# Patient Record
Sex: Female | Born: 1955
Health system: Southern US, Community
[De-identification: ages and names within clinical notes are randomized; demographics above are authoritative.]

## PROBLEM LIST (undated history)

## (undated) DIAGNOSIS — H269 Unspecified cataract: Secondary | ICD-10-CM

## (undated) DIAGNOSIS — T7840XA Allergy, unspecified, initial encounter: Secondary | ICD-10-CM

## (undated) DIAGNOSIS — K219 Gastro-esophageal reflux disease without esophagitis: Secondary | ICD-10-CM

## (undated) DIAGNOSIS — F32A Depression, unspecified: Secondary | ICD-10-CM

## (undated) DIAGNOSIS — IMO0002 Reserved for concepts with insufficient information to code with codable children: Secondary | ICD-10-CM

## (undated) DIAGNOSIS — O24419 Gestational diabetes mellitus in pregnancy, unspecified control: Secondary | ICD-10-CM

## (undated) DIAGNOSIS — C50919 Malignant neoplasm of unspecified site of unspecified female breast: Secondary | ICD-10-CM

## (undated) DIAGNOSIS — Z8619 Personal history of other infectious and parasitic diseases: Secondary | ICD-10-CM

## (undated) DIAGNOSIS — M1990 Unspecified osteoarthritis, unspecified site: Secondary | ICD-10-CM

## (undated) DIAGNOSIS — J45909 Unspecified asthma, uncomplicated: Secondary | ICD-10-CM

## (undated) DIAGNOSIS — I00 Rheumatic fever without heart involvement: Secondary | ICD-10-CM

## (undated) HISTORY — DX: Gestational diabetes mellitus in pregnancy, unspecified control: O24.419

## (undated) HISTORY — PX: BREAST SURGERY: SHX581

## (undated) HISTORY — DX: Unspecified asthma, uncomplicated: J45.909

## (undated) HISTORY — DX: Unspecified osteoarthritis, unspecified site: M19.90

## (undated) HISTORY — DX: Unspecified cataract: H26.9

## (undated) HISTORY — DX: Rheumatic fever without heart involvement: I00

## (undated) HISTORY — DX: Personal history of other infectious and parasitic diseases: Z86.19

## (undated) HISTORY — DX: Depression, unspecified: F32.A

## (undated) HISTORY — DX: Malignant neoplasm of unspecified site of unspecified female breast: C50.919

## (undated) HISTORY — PX: COLONOSCOPY: SHX174

## (undated) HISTORY — PX: TONSILLECTOMY: SUR1361

## (undated) HISTORY — PX: EYE SURGERY: SHX253

## (undated) HISTORY — DX: Gastro-esophageal reflux disease without esophagitis: K21.9

## (undated) HISTORY — DX: Allergy, unspecified, initial encounter: T78.40XA

## (undated) HISTORY — DX: Reserved for concepts with insufficient information to code with codable children: IMO0002

## (undated) HISTORY — PX: RETINAL TEAR REPAIR CRYOTHERAPY: SHX5304

---
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
## 2009-06-30 ENCOUNTER — Ambulatory Visit: Payer: Self-pay | Admitting: Diagnostic Radiology

## 2009-06-30 ENCOUNTER — Ambulatory Visit (HOSPITAL_BASED_OUTPATIENT_CLINIC_OR_DEPARTMENT_OTHER): Admission: RE | Admit: 2009-06-30 | Discharge: 2009-06-30 | Payer: Self-pay | Admitting: Orthopedic Surgery

## 2009-07-08 ENCOUNTER — Encounter: Admission: RE | Admit: 2009-07-08 | Discharge: 2009-10-06 | Payer: Self-pay | Admitting: Orthopedic Surgery

## 2009-08-31 ENCOUNTER — Ambulatory Visit: Payer: Self-pay | Admitting: Family Medicine

## 2009-08-31 ENCOUNTER — Other Ambulatory Visit: Admission: RE | Admit: 2009-08-31 | Discharge: 2009-08-31 | Payer: Self-pay | Admitting: Family Medicine

## 2009-08-31 DIAGNOSIS — G43009 Migraine without aura, not intractable, without status migrainosus: Secondary | ICD-10-CM | POA: Insufficient documentation

## 2009-08-31 DIAGNOSIS — J309 Allergic rhinitis, unspecified: Secondary | ICD-10-CM | POA: Insufficient documentation

## 2009-08-31 DIAGNOSIS — Z780 Asymptomatic menopausal state: Secondary | ICD-10-CM | POA: Insufficient documentation

## 2009-08-31 DIAGNOSIS — N841 Polyp of cervix uteri: Secondary | ICD-10-CM | POA: Insufficient documentation

## 2009-08-31 DIAGNOSIS — M129 Arthropathy, unspecified: Secondary | ICD-10-CM | POA: Insufficient documentation

## 2009-08-31 LAB — HM PAP SMEAR

## 2009-09-01 LAB — CONVERTED CEMR LAB
AST: 25 units/L (ref 0–37)
Albumin: 4.3 g/dL (ref 3.5–5.2)
BUN: 11 mg/dL (ref 6–23)
Basophils Relative: 0.4 % (ref 0.0–3.0)
Chloride: 106 meq/L (ref 96–112)
Cholesterol: 219 mg/dL — ABNORMAL HIGH (ref 0–200)
Creatinine, Ser: 0.8 mg/dL (ref 0.4–1.2)
Direct LDL: 145.3 mg/dL
Eosinophils Relative: 1.2 % (ref 0.0–5.0)
Glucose, Bld: 89 mg/dL (ref 70–99)
HDL: 55.8 mg/dL (ref >39.00)
Monocytes Relative: 4.3 % (ref 3.0–12.0)
Neutro Abs: 3 10*3/uL (ref 1.4–7.7)
Neutrophils Relative %: 66.6 % (ref 43.0–77.0)
Platelets: 285 10*3/uL (ref 150.0–400.0)
Potassium: 4.2 meq/L (ref 3.5–5.1)
Sodium: 146 meq/L — ABNORMAL HIGH (ref 135–145)
TSH: 1.19 microintl units/mL (ref 0.35–5.50)
Total Protein: 7.3 g/dL (ref 6.0–8.3)
Triglycerides: 112 mg/dL (ref 0.0–149.0)
Vit D, 25-Hydroxy: 43 ng/mL (ref 30–89)

## 2009-09-06 ENCOUNTER — Encounter (INDEPENDENT_AMBULATORY_CARE_PROVIDER_SITE_OTHER): Payer: Self-pay | Admitting: *Deleted

## 2009-09-07 ENCOUNTER — Encounter: Payer: Self-pay | Admitting: Family Medicine

## 2009-09-07 ENCOUNTER — Encounter (INDEPENDENT_AMBULATORY_CARE_PROVIDER_SITE_OTHER): Payer: Self-pay | Admitting: *Deleted

## 2009-09-13 ENCOUNTER — Encounter: Payer: Self-pay | Admitting: Family Medicine

## 2009-09-15 ENCOUNTER — Encounter (INDEPENDENT_AMBULATORY_CARE_PROVIDER_SITE_OTHER): Payer: Self-pay | Admitting: *Deleted

## 2009-09-20 ENCOUNTER — Ambulatory Visit: Payer: Self-pay | Admitting: Gastroenterology

## 2009-10-07 ENCOUNTER — Encounter: Payer: Self-pay | Admitting: Family Medicine

## 2009-10-11 ENCOUNTER — Ambulatory Visit: Payer: Self-pay | Admitting: Gastroenterology

## 2009-10-12 ENCOUNTER — Encounter: Payer: Self-pay | Admitting: Gastroenterology

## 2010-03-15 ENCOUNTER — Ambulatory Visit: Payer: Self-pay | Admitting: Family Medicine

## 2010-04-07 ENCOUNTER — Encounter: Payer: Self-pay | Admitting: Family Medicine

## 2010-04-07 LAB — HM MAMMOGRAPHY: HM Mammogram: NORMAL

## 2010-04-08 ENCOUNTER — Encounter (INDEPENDENT_AMBULATORY_CARE_PROVIDER_SITE_OTHER): Payer: Self-pay | Admitting: *Deleted

## 2010-04-11 ENCOUNTER — Encounter: Payer: Self-pay | Admitting: Family Medicine

## 2010-06-21 NOTE — Letter (Signed)
Summary: [facility] Instructions  [facility]  [address] [place], [place] [address]   Phone: [phone]  Fax: [phone]       Katherine Hanna    Apr 05, 1956    MRN: [id number]        Procedure Day Dorna Bloom:  Duanne Limerick  10/11/09     Arrival Time:  8:30AM     Procedure Time:  9:30AM     Location of Procedure:                    _ X_  [facility] (4th Floor)                        PREPARATION FOR COLONOSCOPY WITH MOVIPREP   Starting 5 days prior to your procedure 10/06/09 do not eat nuts, seeds, popcorn, corn, beans, peas,  salads, or any raw vegetables.  Do not take any fiber supplements (e.g. Metamucil, Citrucel, and Benefiber).  THE DAY BEFORE YOUR PROCEDURE         DATE: 10/10/09  DAY: SUNDAY  1.  Drink clear liquids the entire day-NO SOLID FOOD  2.  Do not drink anything colored red or purple.  Avoid juices with pulp.  No orange juice.  3.  Drink at least 64 oz. (8 glasses) of fluid/clear liquids during the day to prevent dehydration and help the prep work efficiently.  CLEAR LIQUIDS INCLUDE: Water Jello Ice Popsicles Tea (sugar ok, no milk/cream) Powdered fruit flavored drinks Coffee (sugar ok, no milk/cream) Gatorade Juice: apple, white grape, white cranberry  Lemonade Clear bullion, consomm, broth Carbonated beverages (any kind) Strained chicken noodle soup Hard Candy                             4.  In the morning, mix first dose of MoviPrep solution:    Empty 1 Pouch A and 1 Pouch B into the disposable container    Add lukewarm drinking water to the top line of the container. Mix to dissolve    Refrigerate (mixed solution should be used within 24 hrs)  5.  Begin drinking the prep at 5:00 p.m. The MoviPrep container is divided by 4 marks.   Every 15 minutes drink the solution down to the next mark (approximately 8 oz) until the full liter is complete.   6.  Follow completed prep with 16 oz of clear liquid of your choice (Nothing  red or purple).  Continue to drink clear liquids until bedtime.  7.  Before going to bed, mix second dose of MoviPrep solution:    Empty 1 Pouch A and 1 Pouch B into the disposable container    Add lukewarm drinking water to the top line of the container. Mix to dissolve    Refrigerate  THE DAY OF YOUR PROCEDURE      DATE: 10/11/09  DAY: MONDAY  Beginning at  4:30AM (5 hours before procedure):         1. Every 15 minutes, drink the solution down to the next mark (approx 8 oz) until the full liter is complete.  2. Follow completed prep with 16 oz. of clear liquid of your choice.    3. You may drink clear liquids until 7:30AM (2 HOURS BEFORE PROCEDURE).   MEDICATION INSTRUCTIONS  Unless otherwise instructed, you should take regular prescription medications with a small sip of water   as early as possible the  morning of your procedure.         OTHER INSTRUCTIONS  You will need a responsible adult at least 57 years of age to accompany you and drive you home.   This person must remain in the waiting room during your procedure.  Wear loose fitting clothing that is easily removed.  Leave jewelry and other valuables at home.  However, you may wish to bring a book to read or  an iPod/MP3 player to listen to music as you wait for your procedure to start.  Remove all body piercing jewelry and leave at home.  Total time from sign-in until discharge is approximately 2-3 hours.  You should go home directly after your procedure and rest.  You can resume normal activities the  day after your procedure.  The day of your procedure you should not:   Drive   Make legal decisions   Operate machinery   Drink alcohol   Return to work  You will receive specific instructions about eating, activities and medications before you leave.    The above instructions have been reviewed and explained to me by   Wyona Almas RN  Sep 20, 2009 11:24 AM     I fully understand and can  verbalize these instructions _____________________________ Date _________

## 2010-06-21 NOTE — Assessment & Plan Note (Signed)
Summary: Hoy Register #1/CDJ  Nurse Visit  CC: Twin rix #1./kb   Allergies: 1)  ! Penicillin  Immunizations Administered:  TwinRix # 1:    Vaccine Type: TwinRix    Site: left deltoid    Mfr: GlaxoSmithKline    Dose: 0.5 ml    Route: IM    Given by: Lucious Groves CMA    Exp. Date: 04/22/2010    Lot #: [id number]    VIS given: 02/07/07 version given March 15, 2010.  Orders Added: 1)  TwinRix 1ml ( Hep A&B Adult dose) [90636] 2)  Admin 1st Vaccine [90471]

## 2010-06-21 NOTE — Letter (Signed)
Summary: Patient Notice- Polyp Results  [facility]  [address] [place], [place] [address]   Phone: [phone]  Fax: [phone]        Oct 12, 2009 MRN: [id number]    JULEA HUTTO [address] [place], [place]  [address]    Dear Ms. Porte,  I am pleased to inform you that the colon polyp(s) removed during your recent colonoscopy was (were) found to be benign (no cancer detected) upon pathologic examination.  I recommend you have a repeat colonoscopy examination in 10_ years to look for recurrent polyps, as having colon polyps increases your risk for having recurrent polyps or even colon cancer in the future.  Should you develop new or worsening symptoms of abdominal pain, bowel habit changes or bleeding from the rectum or bowels, please schedule an evaluation with either your primary care physician or with me.  Additional information/recommendations:  xx__ No further action with gastroenterology is needed at this time. Please      follow-up with your primary care physician for your other healthcare      needs.  __ Please call [phone] to schedule a return visit to review your      situation.  __ Please keep your follow-up visit as already scheduled.  __ Continue treatment plan as outlined the day of your exam.  Please call us if you are having persistent problems or have questions about your condition that have not been fully answered at this time.  Sincerely,  Mardella Layman MD [facility]  This letter has been electronically signed by your physician.  Appended Document: Patient Notice- Polyp Results letter mailed.

## 2010-06-21 NOTE — Letter (Signed)
Summary: Results Follow up Letter  [facility]  [address] [place], [place] [address]   Phone: [phone]  Fax: [phone]    09/06/2009 MRN: [id number]  Katherine Hanna [address] [place], [place]  [address]  Dear Ms. Rettinger,  The following are the results of your recent test(s):  Test         Result    Pap Smear:        Normal __X___  Not Normal _____ Comments: REPEAT 39YR. ______________________________________________________ Cholesterol: LDL(Bad cholesterol):         Your goal is less than:         HDL (Good cholesterol):       Your goal is more than: Comments:  ______________________________________________________ Mammogram:        Normal _____  Not Normal _____ Comments:  ___________________________________________________________________ Hemoccult:        Normal _____  Not normal _______ Comments:    _____________________________________________________________________ Other Tests:    We routinely do not discuss normal results over the telephone.  If you desire a copy of the results, or you have any questions about this information we can discuss them at your next office visit.   Sincerely,

## 2010-06-21 NOTE — Letter (Signed)
Summary: Letter Regarding Health Mgmt Program/[employer]  Letter Regarding Health Mgmt Program/[employer]   Imported By: Lanelle Bal 04/22/2010 08:53:58  _____________________________________________________________________  External Attachment:    Type:   Image     Comment:   External Document

## 2010-06-21 NOTE — Miscellaneous (Signed)
  Clinical Lists Changes  Observations: Added new observation of MAMMOGRAM: normal (04/07/2010 10:58)      Preventive Care Screening  Mammogram:    Date:  04/07/2010    Results:  normal

## 2010-06-21 NOTE — Consult Note (Signed)
Summary: [facility]   Imported By: Lanelle Bal 09/22/2009 09:36:28  _____________________________________________________________________  External Attachment:    Type:   Image     Comment:   External Document

## 2010-06-21 NOTE — Miscellaneous (Signed)
Summary: [facility] Previsit/prep  Clinical Lists Changes  Medications: Added new medication of MOVIPREP 100 GM  SOLR (PEG-KCL-NACL-NASULF-NA ASC-C) As per prep instructions. - Signed Rx of MOVIPREP 100 GM  SOLR (PEG-KCL-NACL-NASULF-NA ASC-C) As per prep instructions.;  #1 x 0;  Signed;  Entered by: Wyona Almas RN;  Authorized by: Mardella Layman MD [facility];  Method used: Electronically to [facility] [address] [place], [place], [place]  [address], Ph: [phone], Fax: [phone] Observations: Added new observation of ALLERGY REV: Done (09/20/2009 10:55)    Prescriptions: MOVIPREP 100 GM  SOLR (PEG-KCL-NACL-NASULF-NA ASC-C) As per prep instructions.  #1 x 0   Entered by:   Wyona Almas RN   Authorized by:   Mardella Layman MD [facility]   Signed by:   Wyona Almas RN on 09/20/2009   Method used:   Electronically to        [facility] (retail)       [address]       [place], [place]  [address]       Ph: [phone]       Fax: [phone]   RxID:   [id number]

## 2010-06-21 NOTE — Procedures (Signed)
Summary: Colonoscopy  Patient: Katherine Hanna Note: All result statuses are Final unless otherwise noted.  Tests: (1) Colonoscopy (COL)   COL Colonoscopy           DONE (C)     [facility]     [address]     [place], [place]  [address]           COLONOSCOPY PROCEDURE REPORT           PATIENT:  Katherine Hanna, Katherine Hanna  MR#:  [id number]     BIRTHDATE:  1955/09/24, 53 yrs. old  GENDER:  female     ENDOSCOPIST:  Vania Rea. Jarold Motto, MD, [facility]     REF. BY:  Helane Rima. Beverely Low, M.D.     PROCEDURE DATE:  10/11/2009     PROCEDURE:  Colonoscopy with biopsy     ASA CLASS:  Class I     INDICATIONS:  Routine Risk Screening     MEDICATIONS:   Fentanyl 50 mcg IV, Versed 3 mg IV           DESCRIPTION OF PROCEDURE:   After the risks benefits and     alternatives of the procedure were thoroughly explained, informed     consent was obtained.  Digital rectal exam was performed and     revealed no abnormalities.   The [id number] [id number] endoscope was     introduced through the anus and advanced to the cecum, which was     identified by both the appendix and ileocecal valve, without     limitations.  The quality of the prep was excellent, using     MoviPrep.  The instrument was then slowly withdrawn as the colon     was fully examined.     <<PROCEDUREIMAGES>>           FINDINGS:  Scattered diverticula were found in the sigmoid colon.     A nodule was found in the sigmoid colon. COLD BIOPSY OF 2 MM     NODULE.  No polyps or cancers were seen.  This was otherwise a     normal examination of the colon.   Retroflexed views in the rectum     revealed no abnormalities.    The scope was then withdrawn from     the patient and the procedure completed.           COMPLICATIONS:  None     ENDOSCOPIC IMPRESSION:     1) Diverticula, scattered in the sigmoid colon     2) Nodule in the sigmoid colon     3) No polyps or cancers     4) Otherwise normal examination     R/O ADENOMA.     RECOMMENDATIONS:     1) high  fiber diet     IF BX. SHOWS AN ADENOMA,REPEAT IN 5 YEARS.     REPEAT EXAM:  No           ______________________________     Vania Rea. Jarold Motto, MD, [facility]           CC:  Sheliah Hatch, MD           n.     REVISED:  10/20/2009 10:28 AM     eSIGNED:   Vania Rea. Alexsandro Salek at 10/20/2009 10:28 AM           Wynonia Hazard, [id number]  Note: An exclamation mark (!) indicates a result that was not dispersed into the flowsheet. Document Creation Date:  10/20/2009 10:29 AM _______________________________________________________________________  (1) Order result status: Final Collection or observation date-time: 10/11/2009 09:43 Requested date-time:  Receipt date-time:  Reported date-time:  Referring Physician:   Ordering Physician: Sheryn Bison [phone]) Specimen Source:  Source: Launa Grill Order Number: [id number] Lab site:

## 2010-06-21 NOTE — Miscellaneous (Signed)
  Clinical Lists Changes  Observations: Added new observation of MAMMOGRAM: incomplete-additional f/u (09/07/2009 16:47)      Preventive Care Screening  Mammogram:    Date:  09/07/2009    Results:  incomplete-additional f/u

## 2010-06-21 NOTE — Procedures (Signed)
Summary: Colonoscopy  Patient: Katherine Hanna Note: All result statuses are Final unless otherwise noted.  Tests: (1) Colonoscopy (COL)   COL Colonoscopy           DONE     [facility]     [address]     [place], [place]  [address]           COLONOSCOPY PROCEDURE REPORT           PATIENT:  Katherine, Hanna  MR#:  [id number]     BIRTHDATE:  [birth year], 53 yrs. old  GENDER:  female     ENDOSCOPIST:  Vania Rea. Jarold Motto, MD, [facility]     REF. BY:  Helane Rima. Beverely Low, M.D.     PROCEDURE DATE:  10/11/2009     PROCEDURE:  Colonoscopy with biopsy     ASA CLASS:  Class I     INDICATIONS:  Routine Risk Screening     MEDICATIONS:   Fentanyl 50 mcg IV, Versed 3 mg IV           DESCRIPTION OF PROCEDURE:   After the risks benefits and     alternatives of the procedure were thoroughly explained, informed     consent was obtained.  Digital rectal exam was performed and     revealed no abnormalities.   The [id number] [id number] endoscope was     introduced through the anus and advanced to the cecum, which was     identified by both the appendix and ileocecal valve, without     limitations.  The quality of the prep was excellent, using     MoviPrep.  The instrument was then slowly withdrawn as the colon     was fully examined.     <<PROCEDUREIMAGES>>           FINDINGS:  Scattered diverticula were found in the sigmoid colon.     A nodule was found in the sigmoid colon. COLD BIOPSY OF 2 MM     NODULE.  No polyps or cancers were seen.  This was otherwise a     normal examination of the colon.   Retroflexed views in the rectum     revealed no abnormalities.    The scope was then withdrawn from     the patient and the procedure completed.           COMPLICATIONS:  None     ENDOSCOPIC IMPRESSION:     1) Diverticula, scattered in the sigmoid colon     2) Nodule in the sigmoid colon     3) No polyps or cancers     4) Otherwise normal examination     R/O ADENOMA.     RECOMMENDATIONS:     1) high  fiber diet     2) Repeat colonoscopy in 5 years if polyp adenomatous; otherwise     10 years     REPEAT EXAM:  No           ______________________________     Vania Rea. Jarold Motto, MD, [facility]           CC:  Sheliah Hatch, MD           n.     Rosalie DoctorVania Rea. Patterson at 10/11/2009 09:47 AM           Wynonia Hazard, [id number]  Note: An exclamation mark (!) indicates a result that was not dispersed into the flowsheet. Document Creation Date: 10/11/2009 9:48  AM _______________________________________________________________________  (1) Order result status: Final Collection or observation date-time: 10/11/2009 09:43 Requested date-time:  Receipt date-time:  Reported date-time:  Referring Physician:   Ordering Physician: Sheryn Bison [phone]) Specimen Source:  Source: Launa Grill Order Number: [id number] Lab site:   Appended Document: Colonoscopy 10y f/u

## 2010-06-21 NOTE — Assessment & Plan Note (Signed)
Summary: NEW TO EST/CPX//PH   Vital Signs:  Patient profile:   55 year old female Height:      65 inches Weight:      164 pounds BMI:     27.39 Pulse rate:   86 / minute BP sitting:   112 / 80  (left arm)  Vitals Entered By: Doristine Devoid (August 31, 2009 10:21 AM) CC: NEW EST- CPX AND PAP W/ LABS    History of Present Illness: 55 yo woman here today to establish care.  Last doctor visit 15 yrs ago.  no concerns today.  Preventive Screening-Counseling & Management  Alcohol-Tobacco     Alcohol drinks/day: 0     Smoking Status: never  Caffeine-Diet-Exercise     Does Patient Exercise: yes      Sexual History:  currently monogamous.        Drug Use:  no.    Current Medications (verified): 1)  Multivitamins  Tabs (Multiple Vitamin) .... Take One Tablet Daily 2)  Calcium 600 Mg Tabs (Calcium) .... Take One Tablet Daily 3)  Claritin 10 Mg Tabs (Loratadine) .... Take One Tablet Daily  Allergies (verified): 1)  ! Penicillin  Past History:  Past Medical History: Arthritis-knee hx of Chicken Pox Gestational Diabetes Allergic rhinitis Migraines Rheumatic Fever as child   Past Surgical History: Appendectomy Tonsillectomy  Family History: CAD-maternal grandfather HTN-no DM-father STROKE-father,maternal grandmother 40's,paternal grandmother COLON CA-no BREAST CA-no LUNG CA-mother BLADDER CA-father ACUTE LEUKEMIA-father  Social History: Married Never Smoked Alcohol use-no Drug use-no Smoking Status:  never Drug Use:  no Does Patient Exercise:  yes Sexual History:  currently monogamous  Review of Systems  The patient denies anorexia, fever, weight loss, weight gain, vision loss, decreased hearing, hoarseness, chest pain, syncope, dyspnea on exertion, peripheral edema, prolonged cough, headaches, abdominal pain, melena, hematochezia, severe indigestion/heartburn, hematuria, suspicious skin lesions, depression, abnormal bleeding, enlarged lymph nodes, and  breast masses.    Physical Exam  General:  Well-developed,well-nourished,in no acute distress; alert,appropriate and cooperative throughout examination Head:  Normocephalic and atraumatic without obvious abnormalities. No apparent alopecia or balding. Eyes:  No corneal or conjunctival inflammation noted. EOMI. Perrla. Funduscopic exam benign, without hemorrhages, exudates or papilledema. Vision grossly normal. Ears:  External ear exam shows no significant lesions or deformities.  Otoscopic examination reveals clear canals, tympanic membranes are intact bilaterally without bulging, retraction, inflammation or discharge. Hearing is grossly normal bilaterally. Nose:  External nasal examination shows no deformity or inflammation. Nasal mucosa are pink and moist without lesions or exudates. Mouth:  Oral mucosa and oropharynx without lesions or exudates.  Teeth in good repair. Neck:  No deformities, masses, or tenderness noted. Breasts:  No mass, nodules, thickening, tenderness, bulging, retraction, inflamation, nipple discharge or skin changes noted.   Lungs:  Normal respiratory effort, chest expands symmetrically. Lungs are clear to auscultation, no crackles or wheezes. Heart:  Normal rate and regular rhythm. S1 and S2 normal without gallop, murmur, click, rub or other extra sounds. Abdomen:  Bowel sounds positive,abdomen soft and non-tender without masses, organomegaly or hernias noted. Genitalia:  Pelvic Exam:        External: normal female genitalia without lesions or masses        Vagina: normal without lesions or masses        Cervix: polyp protruding from os        Adnexa: normal bimanual exam without masses or fullness        Uterus: normal by palpation  Pap smear: performed Msk:  No deformity or scoliosis noted of thoracic or lumbar spine.   Pulses:  R and L carotid,radial,femoral,dorsalis pedis and posterior tibial pulses are full and equal bilaterally Extremities:  No clubbing,  cyanosis, edema, or deformity noted with normal full range of motion of all joints.   Neurologic:  No cranial nerve deficits noted. Station and gait are normal. Plantar reflexes are down-going bilaterally. DTRs are symmetrical throughout. Sensory, motor and coordinative functions appear intact. Skin:  Intact without suspicious lesions or rashes Cervical Nodes:  No lymphadenopathy noted Axillary Nodes:  No palpable lymphadenopathy Psych:  Cognition and judgment appear intact. Alert and cooperative with normal attention span and concentration. No apparent delusions, illusions, hallucinations   Impression & Recommendations:  Problem # 1:  ROUTINE GYNECOLOGICAL EXAMINATION (ICD-V72.31) Assessment New pt's PE WNL w/ exception of cervical polyp (see below).  check labs.  refer for routing mammogram and colonoscopy screenings.  anticipatory guidance provided. Orders: Venipuncture ([address]) TLB-Lipid Panel (80061-LIPID) TLB-BMP (Basic Metabolic Panel-BMET) (80048-METABOL) TLB-CBC Platelet - w/Differential (85025-CBCD) TLB-Hepatic/Liver Function Pnl (80076-HEPATIC) TLB-TSH (Thyroid Stimulating Hormone) (84443-TSH) T-Vitamin D (25-Hydroxy) ([address]) EKG w/ Interpretation ([address]) Radiology Referral (Radiology)  Problem # 2:  SCREENING FOR MALIGNANT NEOPLASM OF THE CERVIX (ICD-V76.2) Assessment: New pap collected  Problem # 3:  CERVICAL POLYP (ICD-622.7) Assessment: New likely needs bx.  refer to gyn. Orders: Gynecologic Referral (Gyn)  Complete Medication List: 1)  Multivitamins Tabs (Multiple vitamin) .... Take one tablet daily 2)  Calcium 600 Mg Tabs (Calcium) .... Take one tablet daily 3)  Claritin 10 Mg Tabs (Loratadine) .... Take one tablet daily  Other Orders: Gastroenterology Referral (GI)  Patient Instructions: 1)  Follow up in 1 year or as needed 2)  We'll notify you of your lab results 3)  Someone will call you with your mammogram, GI, and GYN appts 4)  Please call with  any questions or concerns 5)  Welcome!  We're glad to have you!   Immunization History:  Tetanus/Td Immunization History:    Tetanus/Td:  historical (09/02/2006)

## 2010-07-25 ENCOUNTER — Encounter: Payer: Self-pay | Admitting: Family Medicine

## 2010-07-25 ENCOUNTER — Ambulatory Visit (INDEPENDENT_AMBULATORY_CARE_PROVIDER_SITE_OTHER): Payer: BC Managed Care – PPO

## 2010-07-25 DIAGNOSIS — Z23 Encounter for immunization: Secondary | ICD-10-CM

## 2010-08-02 NOTE — Assessment & Plan Note (Signed)
Summary: 2nd TwinRix//fd  Nurse Visit   Allergies: 1)  ! Penicillin  Immunizations Administered:  TwinRix # 2:    Vaccine Type: TwinRix    Site: left deltoid    Mfr: GlaxoSmithKline    Dose: 1.0 ml    Route: IM    Given by: Jeremy Johann CMA    Exp. Date: 02/26/2012    Lot #: [id number]    VIS given: 02/07/07 version given July 25, 2010.  Orders Added: 1)  TwinRix 1ml ( Hep A&B Adult dose) [90636] 2)  Admin 1st Vaccine [90471]

## 2010-09-27 ENCOUNTER — Encounter: Payer: Self-pay | Admitting: Family Medicine

## 2010-10-05 ENCOUNTER — Encounter: Payer: Self-pay | Admitting: Family Medicine

## 2010-10-05 ENCOUNTER — Other Ambulatory Visit (HOSPITAL_COMMUNITY)
Admission: RE | Admit: 2010-10-05 | Discharge: 2010-10-05 | Disposition: A | Discharge status: Home / Self Care | Payer: BC Managed Care – PPO | Source: Ambulatory Visit | Attending: Family Medicine | Admitting: Family Medicine

## 2010-10-05 ENCOUNTER — Ambulatory Visit (INDEPENDENT_AMBULATORY_CARE_PROVIDER_SITE_OTHER): Payer: BC Managed Care – PPO | Admitting: Family Medicine

## 2010-10-05 VITALS — BP 140/80 | Temp 98.2°F | Ht 65.5 in | Wt 161.5 lb

## 2010-10-05 DIAGNOSIS — Z01419 Encounter for gynecological examination (general) (routine) without abnormal findings: Secondary | ICD-10-CM

## 2010-10-05 DIAGNOSIS — Z136 Encounter for screening for cardiovascular disorders: Secondary | ICD-10-CM

## 2010-10-05 DIAGNOSIS — Z124 Encounter for screening for malignant neoplasm of cervix: Secondary | ICD-10-CM

## 2010-10-05 DIAGNOSIS — F329 Major depressive disorder, single episode, unspecified: Secondary | ICD-10-CM

## 2010-10-05 DIAGNOSIS — Z0000 Encounter for general adult medical examination without abnormal findings: Secondary | ICD-10-CM

## 2010-10-05 LAB — HEPATIC FUNCTION PANEL
ALT: 24 U/L (ref 0–35)
Albumin: 4.1 g/dL (ref 3.5–5.2)
Total Bilirubin: 0.5 mg/dL (ref 0.3–1.2)
Total Protein: 7.1 g/dL (ref 6.0–8.3)

## 2010-10-05 LAB — CBC WITH DIFFERENTIAL/PLATELET
Basophils Absolute: 0 10*3/uL (ref 0.0–0.1)
Basophils Relative: 0.3 % (ref 0.0–3.0)
Eosinophils Absolute: 0.1 10*3/uL (ref 0.0–0.7)
MCHC: 34.5 g/dL (ref 30.0–36.0)
MCV: 91.1 fl (ref 78.0–100.0)
Monocytes Absolute: 0.3 10*3/uL (ref 0.1–1.0)
Neutrophils Relative %: 66 % (ref 43.0–77.0)
Platelets: 282 10*3/uL (ref 150.0–400.0)
RDW: 13.1 % (ref 11.5–14.6)

## 2010-10-05 LAB — BASIC METABOLIC PANEL
CO2: 29 mEq/L (ref 19–32)
Creatinine, Ser: 0.8 mg/dL (ref 0.4–1.2)
Potassium: 4.6 mEq/L (ref 3.5–5.1)
Sodium: 143 mEq/L (ref 135–145)

## 2010-10-05 LAB — LIPID PANEL: HDL: 57 mg/dL (ref >39.00)

## 2010-10-05 LAB — LDL CHOLESTEROL, DIRECT: Direct LDL: 162.7 mg/dL

## 2010-10-05 MED ORDER — CITALOPRAM HYDROBROMIDE 20 MG PO TABS: 20.0000 mg | ORAL_TABLET | Freq: Every day | ORAL | Status: DC

## 2010-10-05 NOTE — Patient Instructions (Signed)
Follow up in 1 month to recheck mood Start the Citalopram daily for the mood We'll notify you of your lab results Call with any questions or concerns Happy Memorial Day!

## 2010-10-05 NOTE — Progress Notes (Signed)
  Subjective:    Patient ID: Katherine Hanna, female    DOB: 11-01-1955, 55 y.o.   MRN: [id number]  HPI CPE- UTD on colonscopy, mammo.  'just blue'- both children married last year and moved.  Reports she is able to fxn but at times has 'an overwhelming sadness'.  Not hx of similar- reports previous depressive episodes have been situational and have been shorter than 6 months.  Has started to increase exercise to see if sxs improve.   Review of Systems Patient reports no vision/ hearing changes, adenopathy,fever, weight change,  persistant/recurrent hoarseness , swallowing issues, chest pain, palpitations, edema, persistant/recurrent cough, hemoptysis, dyspnea (rest/exertional/paroxysmal nocturnal), gastrointestinal bleeding (melena, rectal bleeding), abdominal pain, significant heartburn, bowel changes, GU symptoms (dysuria, hematuria, incontinence), Gyn symptoms (abnormal  bleeding, pain),  syncope, focal weakness, memory loss, numbness & tingling, abnormal bruising or bleeding.  + ringworm on R lower leg- treating appropriately w/ OTC antifungal cream     Objective:   Physical Exam  General Appearance:    Alert, cooperative, no distress, appears stated age  Head:    Normocephalic, without obvious abnormality, atraumatic  Eyes:    PERRL, conjunctiva/corneas clear, EOM's intact, fundi    benign, both eyes  Ears:    Normal TM's and external ear canals, both ears  Nose:   Nares normal, septum midline, mucosa normal, no drainage    or sinus tenderness  Throat:   Lips, mucosa, and tongue normal; teeth and gums normal  Neck:   Supple, symmetrical, trachea midline, no adenopathy;    Thyroid: no enlargement/tenderness/nodules  Back:     Symmetric, no curvature, ROM normal, no CVA tenderness  Lungs:     Clear to auscultation bilaterally, respirations unlabored  Chest Wall:    No tenderness or deformity   Heart:    Regular rate and rhythm, S1 and S2 normal, no murmur, rub   or gallop  Breast  Exam:    No tenderness, masses, or nipple abnormality  Abdomen:     Soft, non-tender, bowel sounds active all four quadrants,    no masses, no organomegaly  Genitalia:    External genitalia normal, cervix normal in appearance, no CMT, uterus in normal size and position, adnexa w/out mass or tenderness, mucosa pink and moist, no lesions or discharge present  Rectal:    Normal external appearance  Extremities:   Extremities normal, atraumatic, no cyanosis or edema  Pulses:   2+ and symmetric all extremities  Skin:   Skin color, texture, turgor normal, + ringworm on R lower leg  Lymph nodes:   Cervical, supraclavicular, and axillary nodes normal  Neurologic:   CNII-XII intact, normal strength, sensation and reflexes    throughout          Assessment & Plan:

## 2010-10-07 LAB — VITAMIN D 1,25 DIHYDROXY: Vitamin D2 1, 25 (OH)2: <8 pg/mL

## 2010-10-10 ENCOUNTER — Encounter: Payer: Self-pay | Admitting: *Deleted

## 2010-10-11 DIAGNOSIS — F32A Depression, unspecified: Secondary | ICD-10-CM | POA: Insufficient documentation

## 2010-10-11 DIAGNOSIS — F329 Major depressive disorder, single episode, unspecified: Secondary | ICD-10-CM | POA: Insufficient documentation

## 2010-10-11 NOTE — Assessment & Plan Note (Signed)
Pt's mood may be adjustment reaction but given duration >6 months will start low dose SSRI and see if sxs improve.  Will follow closely.

## 2010-10-11 NOTE — Assessment & Plan Note (Signed)
Pap collected. 

## 2010-10-11 NOTE — Assessment & Plan Note (Signed)
Pt's PE WNL w/ exception of ringworm.  UTD on health maintenance.  EKG done as baseline.  Anticipatory guidance provided.

## 2010-10-24 ENCOUNTER — Encounter: Payer: Self-pay | Admitting: Family Medicine

## 2011-10-25 ENCOUNTER — Encounter: Payer: Self-pay | Admitting: Family Medicine

## 2011-12-26 ENCOUNTER — Ambulatory Visit (INDEPENDENT_AMBULATORY_CARE_PROVIDER_SITE_OTHER): Payer: BC Managed Care – PPO | Admitting: Family Medicine

## 2011-12-26 ENCOUNTER — Telehealth: Payer: Self-pay | Admitting: Family Medicine

## 2011-12-26 ENCOUNTER — Encounter: Payer: Self-pay | Admitting: Family Medicine

## 2011-12-26 VITALS — BP 112/74 | HR 89 | Temp 98.2°F | Wt 177.2 lb

## 2011-12-26 DIAGNOSIS — J45901 Unspecified asthma with (acute) exacerbation: Secondary | ICD-10-CM

## 2011-12-26 DIAGNOSIS — R0789 Other chest pain: Secondary | ICD-10-CM

## 2011-12-26 MED ORDER — ALBUTEROL SULFATE (5 MG/ML) 0.5% IN NEBU
2.5000 mg | INHALATION_SOLUTION | Freq: Once | RESPIRATORY_TRACT | Status: AC
  Administered 2011-12-26: 2.5 mg via RESPIRATORY_TRACT

## 2011-12-26 MED ORDER — METHYLPREDNISOLONE ACETATE 80 MG/ML IJ SUSP
80.0000 mg | Freq: Once | INTRAMUSCULAR | Status: AC
  Administered 2011-12-26: 80 mg via INTRAMUSCULAR

## 2011-12-26 MED ORDER — PREDNISONE 10 MG PO TABS: ORAL_TABLET | ORAL | Status: DC

## 2011-12-26 MED ORDER — BECLOMETHASONE DIPROPIONATE 80 MCG/ACT IN AERS
INHALATION_SPRAY | RESPIRATORY_TRACT | Status: DC
Start: 2011-12-26 — End: 2012-01-03

## 2011-12-26 NOTE — Telephone Encounter (Signed)
Caller: Katherine Hanna/Patient; PCP: Sheliah Hatch.; CB#: [phone]; Call regarding Asthma; worse in evenings; Onset symptoms "mid May".  Occasional cough worsening in late evening.  Emergent symptoms ruled out.  See provider in 4 hours per Asthma - Adult protocol.   Appoitnment with Dr. Laury Axon at 14:45 per Asthma Adult protocol.

## 2011-12-26 NOTE — Patient Instructions (Addendum)
Asthma Attack Prevention HOW CAN ASTHMA BE PREVENTED? Currently, there is no way to prevent asthma from starting. However, you can take steps to control the disease and prevent its symptoms after you have been diagnosed. Learn about your asthma and how to control it. Take an active role to control your asthma by working with your caregiver to create and follow an asthma action plan. An asthma action plan guides you in taking your medicines properly, avoiding factors that make your asthma worse, tracking your level of asthma control, responding to worsening asthma, and seeking emergency care when needed. To track your asthma, keep records of your symptoms, check your peak flow number using a peak flow meter (handheld device that shows how well air moves out of your lungs), and get regular asthma checkups.  Other ways to prevent asthma attacks include:  Use medicines as your caregiver directs.   Identify and avoid things that make your asthma worse (as much as you can).   Keep track of your asthma symptoms and level of control.   Get regular checkups for your asthma.   With your caregiver, write a detailed plan for taking medicines and managing an asthma attack. Then be sure to follow your action plan. Asthma is an ongoing condition that needs regular monitoring and treatment.   Identify and avoid asthma triggers. A number of outdoor allergens and irritants (pollen, mold, cold air, air pollution) can trigger asthma attacks. Find out what causes or makes your asthma worse, and take steps to avoid those triggers (see below).   Monitor your breathing. Learn to recognize warning signs of an attack, such as slight coughing, wheezing or shortness of breath. However, your lung function may already decrease before you notice any signs or symptoms, so regularly measure and record your peak airflow with a home peak flow meter.   Identify and treat attacks early. If you act quickly, you're less likely to have  a severe attack. You will also need less medicine to control your symptoms. When your peak flow measurements decrease and alert you to an upcoming attack, take your medicine as instructed, and immediately stop any activity that may have triggered the attack. If your symptoms do not improve, get medical help.   Pay attention to increasing quick-relief inhaler use. If you find yourself relying on your quick-relief inhaler (such as albuterol), your asthma is not under control. See your caregiver about adjusting your treatment.  IDENTIFY AND CONTROL FACTORS THAT MAKE YOUR ASTHMA WORSE A number of common things can set off or make your asthma symptoms worse (asthma triggers). Keep track of your asthma symptoms for several weeks, detailing all the environmental and emotional factors that are linked with your asthma. When you have an asthma attack, go back to your asthma diary to see which factor, or combination of factors, might have contributed to it. Once you know what these factors are, you can take steps to control many of them.  Allergies: If you have allergies and asthma, it is important to take asthma prevention steps at home. Asthma attacks (worsening of asthma symptoms) can be triggered by allergies, which can cause temporary increased inflammation of your airways. Minimizing contact with the substance to which you are allergic will help prevent an asthma attack. Animal Dander:   Some people are allergic to the flakes of skin or dried saliva from animals with fur or feathers. Keep these pets out of your home.   If you can't keep a pet outdoors, keep the   pet out of your bedroom and other sleeping areas at all times, and keep the door closed.   Remove carpets and furniture covered with cloth from your home. If that is not possible, keep the pet away from fabric-covered furniture and carpets.  Dust Mites:  Many people with asthma are allergic to dust mites. Dust mites are tiny bugs that are found in  every home, in mattresses, pillows, carpets, fabric-covered furniture, bedcovers, clothes, stuffed toys, fabric, and other fabric-covered items.   Cover your mattress in a special dust-proof cover.   Cover your pillow in a special dust-proof cover, or wash the pillow each week in hot water. Water must be hotter than 130 F to kill dust mites. Cold or warm water used with detergent and bleach can also be effective.   Wash the sheets and blankets on your bed each week in hot water.   Try not to sleep or lie on cloth-covered cushions.   Call ahead when traveling and ask for a smoke-free hotel room. Bring your own bedding and pillows, in case the hotel only supplies feather pillows and down comforters, which may contain dust mites and cause asthma symptoms.   Remove carpets from your bedroom and those laid on concrete, if you can.   Keep stuffed toys out of the bed, or wash the toys weekly in hot water or cooler water with detergent and bleach.  Cockroaches:  Many people with asthma are allergic to the droppings and remains of cockroaches.   Keep food and garbage in closed containers. Never leave food out.   Use poison baits, traps, powders, gels, or paste (for example, boric acid).   If a spray is used to kill cockroaches, stay out of the room until the odor goes away.  Indoor Mold:  Fix leaky faucets, pipes, or other sources of water that have mold around them.   Clean moldy surfaces with a cleaner that has bleach in it.  Pollen and Outdoor Mold:  When pollen or mold spore counts are high, try to keep your windows closed.   Stay indoors with windows closed from late morning to afternoon, if you can. Pollen and some mold spore counts are highest at that time.   Ask your caregiver whether you need to take or increase anti-inflammatory medicine before your allergy season starts.  Irritants:   Tobacco smoke is an irritant. If you smoke, ask your caregiver how you can quit. Ask family  members to quit smoking, too. Do not allow smoking in your home or car.   If possible, do not use a wood-burning stove, kerosene heater, or fireplace. Minimize exposure to all sources of smoke, including incense, candles, fires, and fireworks.   Try to stay away from strong odors and sprays, such as perfume, talcum powder, hair spray, and paints.   Decrease humidity in your home and use an indoor air cleaning device. Reduce indoor humidity to below 60 percent. Dehumidifiers or central air conditioners can do this.   Try to have someone else vacuum for you once or twice a week, if you can. Stay out of rooms while they are being vacuumed and for a short while afterward.   If you vacuum, use a dust mask from a hardware store, a double-layered or microfilter vacuum cleaner bag, or a vacuum cleaner with a HEPA filter.   Sulfites in foods and beverages can be irritants. Do not drink beer or wine, or eat dried fruit, processed potatoes, or shrimp if they cause asthma   symptoms.   Cold air can trigger an asthma attack. Cover your nose and mouth with a scarf on cold or windy days.   Several health conditions can make asthma more difficult to manage, including runny nose, sinus infections, reflux disease, psychological stress, and sleep apnea. Your caregiver will treat these conditions, as well.   Avoid close contact with people who have a cold or the flu, since your asthma symptoms may get worse if you catch the infection from them. Wash your hands thoroughly after touching items that may have been handled by people with a respiratory infection.   Get a flu shot every year to protect against the flu virus, which often makes asthma worse for days or weeks. Also get a pneumonia shot once every five to 10 years.  Drugs:  Aspirin and other painkillers can cause asthma attacks. 10% to 20% of people with asthma have sensitivity to aspirin or a group of painkillers called non-steroidal anti-inflammatory drugs  (NSAIDS), such as ibuprofen and naproxen. These drugs are used to treat pain and reduce fevers. Asthma attacks caused by any of these medicines can be severe and even fatal. These drugs must be avoided in people who have known aspirin sensitive asthma. Products with acetaminophen are considered safe for people who have asthma. It is important that people with aspirin sensitivity read labels of all over-the-counter drugs used to treat pain, colds, coughs, and fever.   Beta blockers and ACE inhibitors are other drugs which you should discuss with your caregiver, in relation to your asthma.  ALLERGY SKIN TESTING  Ask your asthma caregiver about allergy skin testing or blood testing (RAST test) to identify the allergens to which you are sensitive. If you are found to have allergies, allergy shots (immunotherapy) for asthma may help prevent future allergies and asthma. With allergy shots, small doses of allergens (substances to which you are allergic) are injected under your skin on a regular schedule. Over a period of time, your body may become used to the allergen and less responsive with asthma symptoms. You can also take measures to minimize your exposure to those allergens. EXERCISE  If you have exercise-induced asthma, or are planning vigorous exercise, or exercise in cold, humid, or dry environments, prevent exercise-induced asthma by following your caregiver's advice regarding asthma treatment before exercising. Document Released: 04/26/2009 Document Revised: 04/27/2011 Document Reviewed: 04/26/2009 [employer] Patient Information 2012 [employer]. 

## 2011-12-26 NOTE — Progress Notes (Signed)
  Subjective:     Katherine Hanna is an 56 y.o. female who presents for follow up of asthma. The patient is currently having symptoms / an exacerbation. Current symptoms include dyspnea, non-productive cough and wheezing. Symptoms have been present since several months ago and have been gradually worsening. She denies chest pain, chest tightness and productive cough. Associated symptoms include poor exercise tolerance, shortness of breath and wheezing.  This episode appears to have been triggered by exercise, infection, pollens and humidity. Treatments tried for the current exacerbation include short-acting inhaled beta-adrenergic agonists, which have provided some relief of symptoms.   Current Disease Severity Katherine Hanna has frequent daytime asthma symptoms. She has frequent nighttime asthma symptoms. The patient is using short-acting beta agonists for symptom control more than 2 days per week but not more than once a day. She has exacerbations requiring oral systemic corticosteroids 1 times per year. Current limitations in activity from asthma: none. Number of days of school or work missed in the last month: not applicable. Number of urgent/emergent visit in last year: 0   The following portions of the patient's history were reviewed and updated as appropriate: allergies, current medications, past family history, past medical history, past social history, past surgical history and problem list.  Review of Systems Pertinent items are noted in HPI.    Objective:    Oxygen saturation 98% on room air BP 112/74  Pulse 89  Temp 98.2 F (36.8 C) (Oral)  Wt 177 lb 3.2 oz (80.377 kg)  SpO2 98% General appearance: alert, cooperative, appears stated age and no distress Ears: normal TM's and external ear canals both ears Nose: Nares normal. Septum midline. Mucosa normal. No drainage or sinus tenderness. Throat: lips, mucosa, and tongue normal; teeth and gums normal Lungs: wheezes bilaterally Heart: S1, S2  normal Extremities: extremities normal, atraumatic, no cyanosis or edema    Assessment:    Moderate persistent asthma, improved.     Plan:    Review treatment goals of symptom prevention and prevention of exacerbations and use of ER/inpatient care. Medications: begin qvar 80 mg . Beta-agonist nebulizer treatment given in the office with some relief of symptoms. Discussed medication dosage, use, side effects, and goals of treatment in detail.   Discussed avoidance of precipitants. Asthma information handout given. Discussed technique for using MDIs and/or nebulizer.

## 2012-01-03 ENCOUNTER — Other Ambulatory Visit: Payer: Self-pay | Admitting: *Deleted

## 2012-01-03 DIAGNOSIS — J45901 Unspecified asthma with (acute) exacerbation: Secondary | ICD-10-CM

## 2012-01-03 NOTE — Telephone Encounter (Signed)
Pt left VM stating that the qvar 80 mg sample is helping with her symptoms and would like to now have a Rx sent in to pharmacy. .Please advise

## 2012-01-03 NOTE — Telephone Encounter (Signed)
Left message to call office

## 2012-01-03 NOTE — Telephone Encounter (Signed)
Ok to provide Qvar as listed in chart Please remind pt that she is overdue for CPE and needs to schedule

## 2012-01-04 MED ORDER — BECLOMETHASONE DIPROPIONATE 80 MCG/ACT IN AERS: INHALATION_SPRAY | RESPIRATORY_TRACT | Status: DC

## 2012-01-04 NOTE — Telephone Encounter (Signed)
Caller: [name]/Patient; Patient Name: Katherine Hanna; PCP: Lelon Perla.; Best Callback Phone Number: [phone]; Patient calls this morning as instructed by provider if Qvar sample she was given is working.  Patient states that it is and that she is asymptomatic.  She would like to have it called in to a new pharmacy: [facility] @ [phone]. RN noted patient was over due for CPE in patient's record therefore gave instructions for patient to call and schedule appointment.  FOLLOW UP FOR PATIENT:  PLEASE SEND RX FOR QVAR TO [facility] @ [phone]-THIS IS A DIFFERENT DRUG STORE PER PATIENT. PER EPIC PATIENT INSTRUCTED TO SCHEDULE APPOINTMENT FOR CPE.

## 2012-01-04 NOTE — Telephone Encounter (Signed)
Rx sent 

## 2012-02-06 ENCOUNTER — Other Ambulatory Visit: Payer: Self-pay | Admitting: Family Medicine

## 2012-02-06 DIAGNOSIS — J45901 Unspecified asthma with (acute) exacerbation: Secondary | ICD-10-CM

## 2012-02-07 MED ORDER — BECLOMETHASONE DIPROPIONATE 80 MCG/ACT IN AERS: INHALATION_SPRAY | RESPIRATORY_TRACT | Status: DC

## 2012-02-07 NOTE — Telephone Encounter (Signed)
rx sent to pharmacy by e-script  

## 2012-02-08 MED ORDER — ALBUTEROL SULFATE HFA 108 (90 BASE) MCG/ACT IN AERS: 2.0000 | INHALATION_SPRAY | Freq: Four times a day (QID) | RESPIRATORY_TRACT | Status: DC | PRN

## 2012-02-08 NOTE — Telephone Encounter (Signed)
rx sent to pharmacy by e-script For ventolin, Qvar sent previously

## 2012-02-08 NOTE — Addendum Note (Signed)
Addended by: Derry Lory A on: 02/08/2012 06:02 PM   Modules accepted: Orders

## 2012-02-28 ENCOUNTER — Ambulatory Visit (INDEPENDENT_AMBULATORY_CARE_PROVIDER_SITE_OTHER): Payer: BC Managed Care – PPO | Admitting: Family Medicine

## 2012-02-28 ENCOUNTER — Other Ambulatory Visit (HOSPITAL_COMMUNITY)
Admission: RE | Admit: 2012-02-28 | Discharge: 2012-02-28 | Disposition: A | Discharge status: Home / Self Care | Payer: BC Managed Care – PPO | Source: Ambulatory Visit | Attending: Family Medicine | Admitting: Family Medicine

## 2012-02-28 ENCOUNTER — Encounter: Payer: Self-pay | Admitting: Family Medicine

## 2012-02-28 VITALS — BP 130/77 | HR 86 | Temp 98.3°F | Ht 65.0 in | Wt 172.3 lb

## 2012-02-28 DIAGNOSIS — Z0000 Encounter for general adult medical examination without abnormal findings: Secondary | ICD-10-CM

## 2012-02-28 DIAGNOSIS — T781XXA Other adverse food reactions, not elsewhere classified, initial encounter: Secondary | ICD-10-CM

## 2012-02-28 DIAGNOSIS — Z01419 Encounter for gynecological examination (general) (routine) without abnormal findings: Secondary | ICD-10-CM

## 2012-02-28 DIAGNOSIS — Z124 Encounter for screening for malignant neoplasm of cervix: Secondary | ICD-10-CM

## 2012-02-28 DIAGNOSIS — Z91018 Allergy to other foods: Secondary | ICD-10-CM

## 2012-02-28 LAB — CBC WITH DIFFERENTIAL/PLATELET
Basophils Absolute: 0 10*3/uL (ref 0.0–0.1)
Eosinophils Relative: 0.6 % (ref 0.0–5.0)
HCT: 42.2 % (ref 36.0–46.0)
Hemoglobin: 14.1 g/dL (ref 12.0–15.0)
Lymphocytes Relative: 18.1 % (ref 12.0–46.0)
Lymphs Abs: 1.2 10*3/uL (ref 0.7–4.0)
MCV: 91.3 fl (ref 78.0–100.0)
Monocytes Absolute: 0.3 10*3/uL (ref 0.1–1.0)
Neutrophils Relative %: 76.2 % (ref 43.0–77.0)
RBC: 4.63 Mil/uL (ref 3.87–5.11)

## 2012-02-28 LAB — HEPATIC FUNCTION PANEL
AST: 19 U/L (ref 0–37)
Albumin: 4.1 g/dL (ref 3.5–5.2)
Alkaline Phosphatase: 117 U/L (ref 39–117)

## 2012-02-28 LAB — TSH: TSH: 2.09 u[IU]/mL (ref 0.35–5.50)

## 2012-02-28 LAB — BASIC METABOLIC PANEL: Sodium: 141 mEq/L (ref 135–145)

## 2012-02-28 LAB — LDL CHOLESTEROL, DIRECT: Direct LDL: 140.8 mg/dL

## 2012-02-28 NOTE — Progress Notes (Signed)
  Subjective:    Patient ID: Katherine Hanna, female    DOB: [birth year], 56 y.o.   MRN: [id number]  HPI CPE- UTD on colonoscopy, mammo.  Due for pap.  ? Food allergy- pt had bronchitis in the spring, took 2 rounds of meds, was noted to be wheezing.  Wheezing never improved.  Saw Dr Laury Axon in August and dx'd w/ asthma.  When asked about food allergies pt was tested in HS and remembered an issue w/ shell fish and corn.  Has been trying to eliminate these from diet but can tell when there is something she has eaten that bothers her breathing- 'i get my asthma cough'.  Keeping food diary.   Review of Systems Patient reports no vision/ hearing changes, adenopathy,fever, weight change,  persistant/recurrent hoarseness , swallowing issues, chest pain, palpitations, edema, persistant/recurrent cough, hemoptysis, dyspnea (rest/exertional/paroxysmal nocturnal), gastrointestinal bleeding (melena, rectal bleeding), abdominal pain, significant heartburn, bowel changes, GU symptoms (dysuria, hematuria, incontinence), Gyn symptoms (abnormal  bleeding, pain),  syncope, focal weakness, memory loss, numbness & tingling, skin/hair/nail changes, abnormal bruising or bleeding, anxiety, or depression.     Objective:   Physical Exam  General Appearance:    Alert, cooperative, no distress, appears stated age  Head:    Normocephalic, without obvious abnormality, atraumatic  Eyes:    PERRL, conjunctiva/corneas clear, EOM's intact, fundi    benign, both eyes  Ears:    Normal TM's and external ear canals, both ears  Nose:   Nares normal, septum midline, mucosa normal, no drainage    or sinus tenderness  Throat:   Lips, mucosa, and tongue normal; teeth and gums normal  Neck:   Supple, symmetrical, trachea midline, no adenopathy;    Thyroid: no enlargement/tenderness/nodules  Back:     Symmetric, no curvature, ROM normal, no CVA tenderness  Lungs:     Clear to auscultation bilaterally, respirations unlabored  Chest Wall:     No tenderness or deformity   Heart:    Regular rate and rhythm, S1 and S2 normal, no murmur, rub   or gallop  Breast Exam:    No tenderness, masses, or nipple abnormality  Abdomen:     Soft, non-tender, bowel sounds active all four quadrants,    no masses, no organomegaly  Genitalia:    External genitalia normal, cervix normal in appearance, no CMT, uterus in normal size and position, adnexa w/out mass or tenderness, mucosa pink and moist, no lesions or discharge present  Rectal:    Normal external appearance  Extremities:   Extremities normal, atraumatic, no cyanosis or edema  Pulses:   2+ and symmetric all extremities  Skin:   Skin color, texture, turgor normal, no rashes or lesions  Lymph nodes:   Cervical, supraclavicular, and axillary nodes normal  Neurologic:   CNII-XII intact, normal strength, sensation and reflexes    throughout          Assessment & Plan:

## 2012-02-28 NOTE — Assessment & Plan Note (Signed)
Pt w/ possible food allergy that is triggering asthma.  Check food panel.  Encouraged continued avoidance.

## 2012-02-28 NOTE — Assessment & Plan Note (Signed)
Pap collected. 

## 2012-02-28 NOTE — Assessment & Plan Note (Signed)
Pt's PE WNL.  Check labs.  UTD on health maintenance.  Anticipatory guidance provided. 

## 2012-02-28 NOTE — Patient Instructions (Addendum)
Follow up in 1 year or as needed We'll notify you of your lab results Call with any questions or concerns Keep up the good work, you look great! Happy [birth year] Birthday!!!

## 2012-03-03 LAB — VITAMIN D 1,25 DIHYDROXY
Vitamin D2 1, 25 (OH)2: <8 pg/mL
Vitamin D3 1, 25 (OH)2: 84 pg/mL

## 2012-03-05 LAB — IGG FOOD PANEL
Allergen, Milk, IgG: 16.4 ug/mL — ABNORMAL HIGH (ref <0.15)
Corn, IgG: <0.15 ug/mL (ref <0.15)
Egg yolk, IgG: <2 ug/mL (ref <2.0)
Wheat, IgG: 0.18 ug/mL — ABNORMAL HIGH (ref <0.15)

## 2012-04-09 ENCOUNTER — Telehealth: Payer: Self-pay | Admitting: Family Medicine

## 2012-04-09 DIAGNOSIS — J45901 Unspecified asthma with (acute) exacerbation: Secondary | ICD-10-CM

## 2012-04-09 MED ORDER — BECLOMETHASONE DIPROPIONATE 80 MCG/ACT IN AERS: INHALATION_SPRAY | RESPIRATORY_TRACT | Status: DC

## 2012-04-09 NOTE — Telephone Encounter (Signed)
Refill: Qvar 0.08 mg/inh. Inhale 2 puffs twice daily. Qty 8.7. Last fill 03-08-12

## 2012-04-09 NOTE — Telephone Encounter (Signed)
Rx sent 

## 2012-07-06 ENCOUNTER — Other Ambulatory Visit: Payer: Self-pay

## 2012-07-25 ENCOUNTER — Other Ambulatory Visit: Payer: Self-pay | Admitting: Family Medicine

## 2012-10-08 ENCOUNTER — Telehealth: Payer: Self-pay | Admitting: Family Medicine

## 2012-10-08 ENCOUNTER — Encounter: Payer: Self-pay | Admitting: Family Medicine

## 2012-10-08 ENCOUNTER — Ambulatory Visit (INDEPENDENT_AMBULATORY_CARE_PROVIDER_SITE_OTHER): Payer: BC Managed Care – PPO | Admitting: Family Medicine

## 2012-10-08 VITALS — BP 118/70 | HR 75 | Temp 98.3°F | Wt 168.8 lb

## 2012-10-08 DIAGNOSIS — J209 Acute bronchitis, unspecified: Secondary | ICD-10-CM

## 2012-10-08 MED ORDER — PREDNISONE 10 MG PO TABS: ORAL_TABLET | ORAL | Status: DC

## 2012-10-08 MED ORDER — AZITHROMYCIN 250 MG PO TABS: ORAL_TABLET | ORAL | Status: DC

## 2012-10-08 NOTE — Progress Notes (Signed)
  Subjective:     Katherine Hanna is a 57 y.o. female here for evaluation of a cough. Onset of symptoms was 5 days ago. Symptoms have been gradually worsening since that time. The cough is dry and is aggravated by exercise, infection and pollens. Associated symptoms include: postnasal drip and shortness of breath. Patient does have a history of asthma. Patient does have a history of environmental allergens. Patient has not traveled recently. Patient does not have a history of smoking. Patient has had a previous chest x-ray. Patient has not had a PPD done.  The following portions of the patient's history were reviewed and updated as appropriate: allergies, current medications, past family history, past medical history, past social history, past surgical history and problem list.  Review of Systems Pertinent items are noted in HPI.    Objective:    Oxygen saturation 98% on room air BP 118/70  Pulse 75  Temp(Src) 98.3 F (36.8 C) (Oral)  Wt 168 lb 12.8 oz (76.567 kg)  BMI 28.09 kg/m2  SpO2 98% General appearance: alert, cooperative, appears stated age and no distress Ears: normal TM's and external ear canals both ears Nose: Nares normal. Septum midline. Mucosa normal. No drainage or sinus tenderness. Throat: lips, mucosa, and tongue normal; teeth and gums normal Neck: mild anterior cervical adenopathy, supple, symmetrical, trachea midline and thyroid not enlarged, symmetric, no tenderness/mass/nodules Lungs: clear to auscultation bilaterally Heart: S1, S2 normal    Assessment:    Acute Bronchitis    Plan:    Antibiotics per medication orders. Avoid exposure to tobacco smoke and fumes. B-agonist inhaler. Call if shortness of breath worsens, blood in sputum, change in character of cough, development of fever or chills, inability to maintain nutrition and hydration. Avoid exposure to tobacco smoke and fumes. Steroid inhaler as ordered.

## 2012-10-08 NOTE — Telephone Encounter (Signed)
Appointment Scheduled:  10/08/2012 11:15:00  Appointment Scheduled Provider:  Lelon Perla

## 2012-10-08 NOTE — Telephone Encounter (Signed)
Patient Information:  Caller Name: Demyah  Phone: [phone]  Patient: Katherine Hanna, Katherine Hanna  Gender: Female  DOB: [birth year]  Age: 57 Years  PCP: Sheliah Hatch.  Office Follow Up:  Does the office need to follow up with this patient?: No  Instructions For The Office: N/A  RN Note:  Advised patient to call back before appointment if she develops any new symptoms or if any current symptoms worsen. Patient verbalized understanding.  Symptoms  Reason For Call & Symptoms: Dry cough, low grade fever. Shortness of breath with coughing spells. Also reports dizziness at times after having coughing spell.  Reviewed Health History In EMR: Yes  Reviewed Medications In EMR: Yes  Reviewed Allergies In EMR: Yes  Reviewed Surgeries / Procedures: Yes  Date of Onset of Symptoms: 10/04/2012  Treatments Tried: Albuterol inhaler, Mucinex  Treatments Tried Worked: No  Guideline(s) Used:  Cough  Disposition Per Guideline:   See Today in Office  Reason For Disposition Reached:   Severe coughing spells (e.g., whooping sound after coughing, vomiting after coughing)  Advice Given:  N/A  Patient Will Follow Care Advice:  YES  Appointment Scheduled:  10/08/2012 11:15:00 Appointment Scheduled Provider:  Lelon Perla.

## 2012-10-08 NOTE — Patient Instructions (Signed)

## 2012-12-10 ENCOUNTER — Encounter: Payer: Self-pay | Admitting: Family Medicine

## 2012-12-13 ENCOUNTER — Encounter: Payer: Self-pay | Admitting: Family Medicine

## 2013-03-11 ENCOUNTER — Encounter: Payer: BC Managed Care – PPO | Admitting: Family Medicine

## 2013-03-27 ENCOUNTER — Other Ambulatory Visit: Payer: Self-pay

## 2013-04-23 ENCOUNTER — Telehealth: Payer: Self-pay

## 2013-04-23 NOTE — Telephone Encounter (Signed)
Medication and allergies: reviewed and updated  90 day supply/mail order: na Local pharmacy: [facility] in [place]   Immunizations due:  UTD  A/P:   No changes to FH or PSH or personal hx Flu vaccine at work--02/2013 Tdap--08/2006 Pap--02/2012--neg MMG--09/2012--Bilateral Gamma Imaging--benign findings CCS--09/2009--09/2009--Dr Patterson--benign findings--next due 2021  To Discuss with Provider: Both breast--cyst that fill with fluid Sinus drainage

## 2013-04-24 ENCOUNTER — Ambulatory Visit (INDEPENDENT_AMBULATORY_CARE_PROVIDER_SITE_OTHER): Payer: BC Managed Care – PPO | Admitting: Family Medicine

## 2013-04-24 ENCOUNTER — Encounter: Payer: Self-pay | Admitting: Family Medicine

## 2013-04-24 VITALS — BP 118/80 | HR 72 | Temp 97.9°F | Resp 16 | Ht 65.25 in | Wt 153.4 lb

## 2013-04-24 DIAGNOSIS — Z780 Asymptomatic menopausal state: Secondary | ICD-10-CM

## 2013-04-24 DIAGNOSIS — Z0000 Encounter for general adult medical examination without abnormal findings: Secondary | ICD-10-CM

## 2013-04-24 LAB — BASIC METABOLIC PANEL
CO2: 30 mEq/L (ref 19–32)
Calcium: 9.7 mg/dL (ref 8.4–10.5)
Chloride: 104 mEq/L (ref 96–112)
Creatinine, Ser: 0.8 mg/dL (ref 0.4–1.2)
Glucose, Bld: 84 mg/dL (ref 70–99)

## 2013-04-24 LAB — LIPID PANEL
Cholesterol: 186 mg/dL (ref 0–200)
LDL Cholesterol: 122 mg/dL — ABNORMAL HIGH (ref 0–99)
Total CHOL/HDL Ratio: 4
Triglycerides: 88 mg/dL (ref 0.0–149.0)

## 2013-04-24 LAB — CBC WITH DIFFERENTIAL/PLATELET
Basophils Relative: 0.3 % (ref 0.0–3.0)
Eosinophils Absolute: 0 10*3/uL (ref 0.0–0.7)
Eosinophils Relative: 0.7 % (ref 0.0–5.0)
Lymphocytes Relative: 23.3 % (ref 12.0–46.0)
MCHC: 34.1 g/dL (ref 30.0–36.0)
MCV: 90.1 fl (ref 78.0–100.0)
Monocytes Absolute: 0.3 10*3/uL (ref 0.1–1.0)
Neutrophils Relative %: 71 % (ref 43.0–77.0)
Platelets: 278 10*3/uL (ref 150.0–400.0)
RBC: 4.38 Mil/uL (ref 3.87–5.11)
WBC: 7.2 10*3/uL (ref 4.5–10.5)

## 2013-04-24 LAB — HEPATIC FUNCTION PANEL
ALT: 17 U/L (ref 0–35)
Albumin: 4.2 g/dL (ref 3.5–5.2)
Alkaline Phosphatase: 83 U/L (ref 39–117)
Bilirubin, Direct: 0.1 mg/dL (ref 0.0–0.3)
Total Bilirubin: 0.6 mg/dL (ref 0.3–1.2)
Total Protein: 7.3 g/dL (ref 6.0–8.3)

## 2013-04-24 NOTE — Progress Notes (Signed)
   Subjective:    Patient ID: Katherine Hanna, female    DOB: Mar 27, 1956, 57 y.o.   MRN: [id number]  HPI Pre visit review using our clinic review tool, if applicable. No additional management support is needed unless otherwise documented below in the visit note.  CPE- UTD on pap, mammo, colonoscopy.  Has never had DEXA.   Review of Systems Patient reports no vision/ hearing changes, adenopathy,fever, weight change,  persistant/recurrent hoarseness , swallowing issues, chest pain, palpitations, edema, persistant/recurrent cough, hemoptysis, dyspnea (rest/exertional/paroxysmal nocturnal), gastrointestinal bleeding (melena, rectal bleeding), abdominal pain, significant heartburn, bowel changes, GU symptoms (dysuria, hematuria, incontinence), Gyn symptoms (abnormal  bleeding, pain),  syncope, focal weakness, memory loss, numbness & tingling, skin/hair/nail changes, abnormal bruising or bleeding, anxiety, or depression.     Objective:   Physical Exam  General Appearance:    Alert, cooperative, no distress, appears stated age  Head:    Normocephalic, without obvious abnormality, atraumatic  Eyes:    PERRL, conjunctiva/corneas clear, EOM's intact, fundi    benign, both eyes  Ears:    Normal TM's and external ear canals, both ears  Nose:   Nares normal, septum midline, mucosa normal, no drainage    or sinus tenderness  Throat:   Lips, mucosa, and tongue normal; teeth and gums normal  Neck:   Supple, symmetrical, trachea midline, no adenopathy;    Thyroid: no enlargement/tenderness/nodules  Back:     Symmetric, no curvature, ROM normal, no CVA tenderness  Lungs:     Clear to auscultation bilaterally, respirations unlabored  Chest Wall:    No tenderness or deformity   Heart:    Regular rate and rhythm, S1 and S2 normal, no murmur, rub   or gallop  Breast Exam:    No tenderness or nipple abnormality.  + masses ~1 inch from areola, no TTP, pt reports hx of cysts  Abdomen:     Soft, non-tender,  bowel sounds active all four quadrants,    no masses, no organomegaly  Genitalia:    deferred  Rectal:    Normal external appearance  Extremities:   Extremities normal, atraumatic, no cyanosis or edema  Pulses:   2+ and symmetric all extremities  Skin:   Skin color, texture, turgor normal, no rashes or lesions  Lymph nodes:   Cervical, supraclavicular, and axillary nodes normal  Neurologic:   CNII-XII intact, normal strength, sensation and reflexes    throughout          Assessment & Plan:

## 2013-04-24 NOTE — Patient Instructions (Signed)
Follow up in 1 year or as needed Keep up the good work!  You look great! We'll notify you of your lab results and make any changes if needed Call with any questions or concerns Happy Holidays!! 

## 2013-04-24 NOTE — Assessment & Plan Note (Signed)
Pt's PE WNL.  UTD on health maintenance w/ exception of DEXA- order entered.  Check labs.  Anticipatory guidance provided.  

## 2013-04-28 LAB — VITAMIN D 1,25 DIHYDROXY
Vitamin D 1, 25 (OH)2 Total: 78 pg/mL — ABNORMAL HIGH (ref 18–72)
Vitamin D2 1, 25 (OH)2: <8 pg/mL
Vitamin D3 1, 25 (OH)2: 78 pg/mL

## 2013-05-06 ENCOUNTER — Other Ambulatory Visit: Payer: Self-pay | Admitting: Family Medicine

## 2013-05-06 NOTE — Telephone Encounter (Signed)
Med filled.  

## 2013-10-16 LAB — HM DEXA SCAN

## 2013-10-16 LAB — HM MAMMOGRAPHY

## 2013-10-20 ENCOUNTER — Encounter: Payer: Self-pay | Admitting: General Practice

## 2013-10-30 ENCOUNTER — Telehealth: Payer: Self-pay | Admitting: General Practice

## 2013-10-30 ENCOUNTER — Encounter: Payer: Self-pay | Admitting: General Practice

## 2013-10-30 MED ORDER — ALENDRONATE SODIUM 70 MG PO TABS: 70.0000 mg | ORAL_TABLET | ORAL | Status: DC

## 2013-10-30 NOTE — Telephone Encounter (Signed)
Med filled to [facility] as pt requested.

## 2013-10-30 NOTE — Telephone Encounter (Signed)
Called pt to notify of bone density results. Birdie Riddle would like pt to start fosamax 70mg  . Need to find out what pharmacy pt would like this med to go to.

## 2013-11-05 ENCOUNTER — Encounter: Payer: Self-pay | Admitting: Family Medicine

## 2013-12-11 ENCOUNTER — Other Ambulatory Visit: Payer: Self-pay | Admitting: Family Medicine

## 2013-12-11 NOTE — Telephone Encounter (Signed)
Med filled.  

## 2014-02-12 ENCOUNTER — Encounter: Payer: Self-pay | Admitting: Gastroenterology

## 2014-03-20 ENCOUNTER — Other Ambulatory Visit: Payer: Self-pay | Admitting: Family Medicine

## 2014-03-20 NOTE — Telephone Encounter (Signed)
Med filled.  

## 2014-04-28 ENCOUNTER — Encounter: Payer: Self-pay | Admitting: Family Medicine

## 2014-04-28 ENCOUNTER — Ambulatory Visit (INDEPENDENT_AMBULATORY_CARE_PROVIDER_SITE_OTHER): Payer: BC Managed Care – PPO | Admitting: Family Medicine

## 2014-04-28 VITALS — BP 118/72 | HR 83 | Temp 98.1°F | Resp 16 | Ht 65.0 in | Wt 153.4 lb

## 2014-04-28 DIAGNOSIS — Z0000 Encounter for general adult medical examination without abnormal findings: Secondary | ICD-10-CM

## 2014-04-28 DIAGNOSIS — Z23 Encounter for immunization: Secondary | ICD-10-CM

## 2014-04-28 LAB — BASIC METABOLIC PANEL
BUN: 14 mg/dL (ref 6–23)
CALCIUM: 9.3 mg/dL (ref 8.4–10.5)
CO2: 26 meq/L (ref 19–32)
CREATININE: 0.8 mg/dL (ref 0.4–1.2)
Chloride: 107 mEq/L (ref 96–112)
GFR: 80.02 mL/min (ref >60.00)
GLUCOSE: 93 mg/dL (ref 70–99)
Potassium: 3.8 mEq/L (ref 3.5–5.1)
Sodium: 142 mEq/L (ref 135–145)

## 2014-04-28 LAB — LIPID PANEL
Cholesterol: 240 mg/dL — ABNORMAL HIGH (ref 0–200)
HDL: 63.2 mg/dL (ref >39.00)
LDL Cholesterol: 155 mg/dL — ABNORMAL HIGH (ref 0–99)
NONHDL: 176.8
Total CHOL/HDL Ratio: 4
Triglycerides: 107 mg/dL (ref 0.0–149.0)
VLDL: 21.4 mg/dL (ref 0.0–40.0)

## 2014-04-28 LAB — CBC WITH DIFFERENTIAL/PLATELET
BASOS PCT: 0.3 % (ref 0.0–3.0)
Basophils Absolute: 0 10*3/uL (ref 0.0–0.1)
EOS PCT: 0.6 % (ref 0.0–5.0)
Eosinophils Absolute: 0 10*3/uL (ref 0.0–0.7)
HCT: 41.6 % (ref 36.0–46.0)
Hemoglobin: 13.9 g/dL (ref 12.0–15.0)
LYMPHS PCT: 18.5 % (ref 12.0–46.0)
Lymphs Abs: 1.4 10*3/uL (ref 0.7–4.0)
MCHC: 33.3 g/dL (ref 30.0–36.0)
MCV: 91.9 fl (ref 78.0–100.0)
Monocytes Absolute: 0.3 10*3/uL (ref 0.1–1.0)
Monocytes Relative: 4 % (ref 3.0–12.0)
NEUTROS PCT: 76.6 % (ref 43.0–77.0)
Neutro Abs: 5.8 10*3/uL (ref 1.4–7.7)
Platelets: 282 10*3/uL (ref 150.0–400.0)
RBC: 4.52 Mil/uL (ref 3.87–5.11)
RDW: 13.6 % (ref 11.5–15.5)
WBC: 7.5 10*3/uL (ref 4.0–10.5)

## 2014-04-28 LAB — HEPATIC FUNCTION PANEL
ALT: 23 U/L (ref 0–35)
AST: 24 U/L (ref 0–37)
Albumin: 4.4 g/dL (ref 3.5–5.2)
Alkaline Phosphatase: 79 U/L (ref 39–117)
BILIRUBIN TOTAL: 0.7 mg/dL (ref 0.2–1.2)
Bilirubin, Direct: 0.1 mg/dL (ref 0.0–0.3)
TOTAL PROTEIN: 7.6 g/dL (ref 6.0–8.3)

## 2014-04-28 LAB — TSH: TSH: 1.59 u[IU]/mL (ref 0.35–4.50)

## 2014-04-28 LAB — VITAMIN D 25 HYDROXY (VIT D DEFICIENCY, FRACTURES): VITD: 50.61 ng/mL (ref 30.00–100.00)

## 2014-04-28 NOTE — Patient Instructions (Signed)
Follow up in 1 year or as needed Keep up the good work!  You look great! We'll notify you of your lab results and make any changes if needed Call with any questions or concerns Happy Holidays!!

## 2014-04-28 NOTE — Assessment & Plan Note (Signed)
Pt's PE WNL.  UTD on pap, mammo, DEXA, colonoscopy.  Check labs.  Anticipatory guidance provided.

## 2014-04-28 NOTE — Addendum Note (Signed)
Addended by: Kris Hartmann on: 04/28/2014 12:13 PM   Modules accepted: Orders

## 2014-04-28 NOTE — Progress Notes (Signed)
Pre visit review using our clinic review tool, if applicable. No additional management support is needed unless otherwise documented below in the visit note. 

## 2014-04-28 NOTE — Progress Notes (Signed)
   Subjective:    Patient ID: Katherine Hanna, female    DOB: Sep 27, 1955, 58 y.o.   MRN: [id number]  HPI CPE- UTD on pap, mammo, DEXA, colonoscopy.  No concerns today.   Review of Systems Patient reports no vision/ hearing changes, adenopathy,fever, weight change,  persistant/recurrent hoarseness , swallowing issues, chest pain, palpitations, edema, persistant/recurrent cough, hemoptysis, dyspnea (rest/exertional/paroxysmal nocturnal), gastrointestinal bleeding (melena, rectal bleeding), abdominal pain, significant heartburn, bowel changes, GU symptoms (dysuria, hematuria, incontinence), Gyn symptoms (abnormal  bleeding, pain),  syncope, focal weakness, memory loss, numbness & tingling, skin/hair/nail changes, abnormal bruising or bleeding, anxiety, or depression.     Objective:   Physical Exam General Appearance:    Alert, cooperative, no distress, appears stated age  Head:    Normocephalic, without obvious abnormality, atraumatic  Eyes:    PERRL, conjunctiva/corneas clear, EOM's intact, fundi    benign, both eyes  Ears:    Normal TM's and external ear canals, both ears  Nose:   Nares normal, septum midline, mucosa normal, no drainage    or sinus tenderness  Throat:   Lips, mucosa, and tongue normal; teeth and gums normal  Neck:   Supple, symmetrical, trachea midline, no adenopathy;    Thyroid: no enlargement/tenderness/nodules  Back:     Symmetric, no curvature, ROM normal, no CVA tenderness  Lungs:     Clear to auscultation bilaterally, respirations unlabored  Chest Wall:    No tenderness or deformity   Heart:    Regular rate and rhythm, S1 and S2 normal, no murmur, rub   or gallop  Breast Exam:    Deferred to mammo  Abdomen:     Soft, non-tender, bowel sounds active all four quadrants,    no masses, no organomegaly  Genitalia:    Deferred to GYN  Rectal:    Extremities:   Extremities normal, atraumatic, no cyanosis or edema  Pulses:   2+ and symmetric all extremities  Skin:   Skin  color, texture, turgor normal, no rashes or lesions  Lymph nodes:   Cervical, supraclavicular, and axillary nodes normal  Neurologic:   CNII-XII intact, normal strength, sensation and reflexes    throughout          Assessment & Plan:

## 2014-04-29 ENCOUNTER — Encounter: Payer: Self-pay | Admitting: Family Medicine

## 2014-06-26 ENCOUNTER — Ambulatory Visit (HOSPITAL_BASED_OUTPATIENT_CLINIC_OR_DEPARTMENT_OTHER)
Admission: RE | Admit: 2014-06-26 | Discharge: 2014-06-26 | Disposition: A | Discharge status: Home / Self Care | Payer: BLUE CROSS/BLUE SHIELD | Source: Ambulatory Visit | Attending: Medical | Admitting: Medical

## 2014-06-26 ENCOUNTER — Encounter: Payer: Self-pay | Admitting: Medical

## 2014-06-26 ENCOUNTER — Ambulatory Visit (INDEPENDENT_AMBULATORY_CARE_PROVIDER_SITE_OTHER): Payer: BLUE CROSS/BLUE SHIELD | Admitting: Medical

## 2014-06-26 VITALS — BP 150/88 | HR 80 | Temp 98.3°F | Ht 65.0 in | Wt 160.6 lb

## 2014-06-26 DIAGNOSIS — J45909 Unspecified asthma, uncomplicated: Secondary | ICD-10-CM | POA: Insufficient documentation

## 2014-06-26 DIAGNOSIS — R05 Cough: Secondary | ICD-10-CM

## 2014-06-26 DIAGNOSIS — R059 Cough, unspecified: Secondary | ICD-10-CM

## 2014-06-26 DIAGNOSIS — J4520 Mild intermittent asthma, uncomplicated: Secondary | ICD-10-CM

## 2014-06-26 DIAGNOSIS — R0989 Other specified symptoms and signs involving the circulatory and respiratory systems: Secondary | ICD-10-CM | POA: Diagnosis not present

## 2014-06-26 MED ORDER — HYDROCODONE-HOMATROPINE 5-1.5 MG/5ML PO SYRP: 5.0000 mL | ORAL_SOLUTION | Freq: Three times a day (TID) | ORAL | Status: DC | PRN

## 2014-06-26 MED ORDER — AZITHROMYCIN 250 MG PO TABS: ORAL_TABLET | ORAL | Status: DC

## 2014-06-26 NOTE — Patient Instructions (Signed)
Asthma with bronchitis I want you to restart your qvar and used daily for at least 2 wks. Use albuterol 2 inh every 6 hours for next 24 hrs. Then use albuterol every 4-6 on as needed basis. For the cough, I am prescribing hydromet.  I do want you to get cxr. If any persisting productive cough over the weekend then start azitihromycin.   We will update you on cxr results on Monday.   Follow up in 7 days or as needed.

## 2014-06-26 NOTE — Progress Notes (Signed)
Pre visit review using our clinic review tool, if applicable. No additional management support is needed unless otherwise documented below in the visit note. 

## 2014-06-26 NOTE — Progress Notes (Signed)
..  lb

## 2014-06-26 NOTE — Progress Notes (Signed)
Subjective:    Patient ID: CEAZIA HARB, female    DOB: [birth year], 59 y.o.   MRN: [id number]  HPI   Pt in with cough for a couple of days. Pt feels some pnd for 2 days. This am she brought up a little colored mucous. Coughing a lot and trapezius muscle hurt when she coughs. No shoulder pain. No rib pain. Pt husband states she coughed all last night. Pt has asthma. She feels like not taking full deep breaths. No wheezing. No leg pain.  Pt is not on any qvar. She is not on albuterol in last 2 days. Last time used either was couple of months.     Review of Systems  Constitutional: Negative for fever, chills and fatigue.  HENT: Positive for postnasal drip. Negative for congestion, sinus pressure and sore throat.   Respiratory: Positive for cough and shortness of breath. Negative for chest tightness and wheezing.        Hx of asthma. Dry cough at night.  Faint chest congestion.  Cardiovascular: Negative for chest pain and palpitations.       No chest pain.  Musculoskeletal: Negative for back pain and neck pain.       No popliteal pain.   Only trapezius pain when she coughs.  Neurological: Negative for dizziness and headaches.  Hematological: Negative for adenopathy. Does not bruise/bleed easily.    Past Medical History  Diagnosis Date  . Arthritis     knee  . History of chicken pox   . Gestational diabetes   . Allergic rhinitis   . Migraine   . Rheumatic fever     as a child.    History   Social History  . Marital Status: Married    Spouse Name: N/A    Number of Children: N/A  . Years of Education: N/A   Occupational History  . Not on file.   Social History Main Topics  . Smoking status: Never Smoker   . Smokeless tobacco: Not on file  . Alcohol Use: No  . Drug Use: No  . Sexual Activity: Not on file   Other Topics Concern  . Not on file   Social History Narrative    Past Surgical History  Procedure Laterality Date  . Appendectomy    . Tonsillectomy       Family History  Problem Relation Age of Onset  . Coronary artery disease Maternal Grandfather   . Diabetes Father   . Stroke Father   . Stroke Maternal Grandmother     25s  . Stroke Paternal Grandmother   . Lung cancer Mother   . Cancer Father     bladder  . Leukemia Father     Allergies  Allergen Reactions  . Penicillins     Current Outpatient Prescriptions on File Prior to Visit  Medication Sig Dispense Refill  . albuterol (VENTOLIN HFA) 108 (90 BASE) MCG/ACT inhaler Inhale 2 puffs into the lungs every 6 (six) hours as needed. 1 Inhaler 1  . alendronate (FOSAMAX) 70 MG tablet Take 1 tablet (70 mg total) by mouth every 7 (seven) days. Take with a full glass of water on an empty stomach. 4 tablet 11  . calcium carbonate (TUMS - DOSED IN MG ELEMENTAL CALCIUM) 500 MG chewable tablet Chew 1 tablet by mouth daily.    Marland Kitchen co-enzyme Q-10 30 MG capsule Take 30 mg by mouth daily.      . fexofenadine (ALLEGRA) 180 MG tablet Take 180 mg  by mouth daily.    . Multiple Vitamin (MULTIVITAMIN) tablet Take 1 tablet by mouth daily.      Marland Kitchen QVAR 80 MCG/ACT inhaler INHALE 2 PUFFS 2 TIMES DAILY (Patient not taking: Reported on 06/26/2014) 8.7 g 2   No current facility-administered medications on file prior to visit.    BP 150/88 mmHg  Pulse 80  Temp(Src) 98.3 F (36.8 C) (Oral)  Ht 5\' 5"  (1.651 m)  Wt 160 lb 9.6 oz (72.848 kg)  BMI 26.73 kg/m2  SpO2 99%       Objective:   Physical Exam  General  Mental Status - Alert. General Appearance - Well groomed. Not in acute distress.  Skin Rashes- No Rashes.  HEENT Head- Normal. Ear Auditory Canal - Left- Normal. Right - Normal.Tympanic Membrane- Left- Normal. Right- Normal. Eye Sclera/Conjunctiva- Left- Normal. Right- Normal. Nose & Sinuses Nasal Mucosa- Left-  Not boggy or Congested. Right-  Not  boggy or Congested. Mouth & Throat Lips: Upper Lip- Normal: no dryness, cracking, pallor, cyanosis, or vesicular eruption. Lower  Lip-Normal: no dryness, cracking, pallor, cyanosis or vesicular eruption. Buccal Mucosa- Bilateral- No Aphthous ulcers. Oropharynx- No Discharge or Erythema. +pnd. Tonsils: Characteristics- Bilateral- No Erythema or Congestion. Size/Enlargement- Bilateral- No enlargement. Discharge- bilateral-None.  Neck Neck- Supple. No Masses.   Chest and Lung Exam Auscultation: Breath Sounds:- even and unlabored, but bilatera very faintl upper lobe rhonchi.  Cardiovascular Auscultation:Rythm- Regular, rate and rhythm. Murmurs & Other Heart Sounds:Ausculatation of the heart reveal- No Murmurs.  Lymphatic Head & Neck General Head & Neck Lymphatics: Bilateral: Description- No Localized lymphadenopathy.  Lower ext- negative homans signs.no pretibial edema.       Assessment & Plan:  No popliteal pain. Pt educated on if she were develop any then would need a doppler.

## 2014-06-26 NOTE — Assessment & Plan Note (Signed)
I want you to restart your qvar and used daily for at least 2 wks. Use albuterol 2 inh every 6 hours for next 24 hrs. Then use albuterol every 4-6 on as needed basis. For the cough, I am prescribing hydromet.  I do want you to get cxr. If any persisting productive cough over the weekend then start azitihromycin.   We will update you on cxr results on Monday.   Follow up in 7 days or as needed.

## 2014-10-06 ENCOUNTER — Other Ambulatory Visit: Payer: Self-pay | Admitting: Family Medicine

## 2014-10-07 NOTE — Telephone Encounter (Signed)
Med filled.  

## 2014-10-26 LAB — HM MAMMOGRAPHY

## 2014-11-02 ENCOUNTER — Encounter: Payer: Self-pay | Admitting: General Practice

## 2014-11-09 ENCOUNTER — Encounter: Payer: Self-pay | Admitting: Family Medicine

## 2015-06-09 ENCOUNTER — Encounter: Payer: BLUE CROSS/BLUE SHIELD | Admitting: Family Medicine

## 2015-06-17 ENCOUNTER — Telehealth: Payer: Self-pay | Admitting: General Practice

## 2015-06-17 NOTE — Telephone Encounter (Signed)
Called pt to complete pre-visit phone call. LMOVM to return call.

## 2015-06-18 ENCOUNTER — Encounter: Payer: Self-pay | Admitting: Family Medicine

## 2015-06-18 ENCOUNTER — Other Ambulatory Visit (HOSPITAL_COMMUNITY)
Admission: RE | Admit: 2015-06-18 | Discharge: 2015-06-18 | Disposition: A | Discharge status: Home / Self Care | Payer: BLUE CROSS/BLUE SHIELD | Source: Ambulatory Visit | Attending: Family Medicine | Admitting: Family Medicine

## 2015-06-18 ENCOUNTER — Ambulatory Visit (INDEPENDENT_AMBULATORY_CARE_PROVIDER_SITE_OTHER): Payer: BLUE CROSS/BLUE SHIELD | Admitting: Family Medicine

## 2015-06-18 VITALS — BP 125/87 | HR 71 | Temp 98.5°F | Ht 65.0 in | Wt 153.4 lb

## 2015-06-18 DIAGNOSIS — Z23 Encounter for immunization: Secondary | ICD-10-CM

## 2015-06-18 DIAGNOSIS — M810 Age-related osteoporosis without current pathological fracture: Secondary | ICD-10-CM | POA: Insufficient documentation

## 2015-06-18 DIAGNOSIS — Z0001 Encounter for general adult medical examination with abnormal findings: Secondary | ICD-10-CM | POA: Diagnosis not present

## 2015-06-18 DIAGNOSIS — Z0000 Encounter for general adult medical examination without abnormal findings: Secondary | ICD-10-CM | POA: Diagnosis not present

## 2015-06-18 DIAGNOSIS — M654 Radial styloid tenosynovitis [de Quervain]: Secondary | ICD-10-CM

## 2015-06-18 DIAGNOSIS — Z114 Encounter for screening for human immunodeficiency virus [HIV]: Secondary | ICD-10-CM

## 2015-06-18 DIAGNOSIS — Z1151 Encounter for screening for human papillomavirus (HPV): Secondary | ICD-10-CM | POA: Diagnosis present

## 2015-06-18 DIAGNOSIS — Z1159 Encounter for screening for other viral diseases: Secondary | ICD-10-CM | POA: Diagnosis not present

## 2015-06-18 DIAGNOSIS — K121 Other forms of stomatitis: Secondary | ICD-10-CM

## 2015-06-18 DIAGNOSIS — Z01419 Encounter for gynecological examination (general) (routine) without abnormal findings: Secondary | ICD-10-CM | POA: Diagnosis present

## 2015-06-18 DIAGNOSIS — J4520 Mild intermittent asthma, uncomplicated: Secondary | ICD-10-CM

## 2015-06-18 LAB — POCT URINALYSIS DIPSTICK
Bilirubin, UA: NEGATIVE
GLUCOSE UA: NEGATIVE
Ketones, UA: NEGATIVE
Leukocytes, UA: NEGATIVE
Nitrite, UA: NEGATIVE
PROTEIN UA: NEGATIVE
RBC UA: NEGATIVE
SPEC GRAV UA: 1.025
UROBILINOGEN UA: NEGATIVE
pH, UA: 6

## 2015-06-18 LAB — CBC WITH DIFFERENTIAL/PLATELET
BASOS PCT: 0.3 % (ref 0.0–3.0)
Basophils Absolute: 0 10*3/uL (ref 0.0–0.1)
EOS PCT: 1.2 % (ref 0.0–5.0)
Eosinophils Absolute: 0.1 10*3/uL (ref 0.0–0.7)
HEMATOCRIT: 39.7 % (ref 36.0–46.0)
HEMOGLOBIN: 13.2 g/dL (ref 12.0–15.0)
LYMPHS PCT: 23.3 % (ref 12.0–46.0)
Lymphs Abs: 1.1 10*3/uL (ref 0.7–4.0)
MCHC: 33.3 g/dL (ref 30.0–36.0)
MCV: 91.9 fl (ref 78.0–100.0)
Monocytes Absolute: 0.3 10*3/uL (ref 0.1–1.0)
Monocytes Relative: 5.1 % (ref 3.0–12.0)
Neutro Abs: 3.5 10*3/uL (ref 1.4–7.7)
Neutrophils Relative %: 70.1 % (ref 43.0–77.0)
Platelets: 281 10*3/uL (ref 150.0–400.0)
RBC: 4.32 Mil/uL (ref 3.87–5.11)
RDW: 13.7 % (ref 11.5–15.5)
WBC: 4.9 10*3/uL (ref 4.0–10.5)

## 2015-06-18 LAB — LIPID PANEL
CHOL/HDL RATIO: 4
Cholesterol: 175 mg/dL (ref 0–200)
HDL: 48.2 mg/dL (ref >39.00)
LDL CALC: 113 mg/dL — AB (ref 0–99)
NonHDL: 127.13
Triglycerides: 69 mg/dL (ref 0.0–149.0)
VLDL: 13.8 mg/dL (ref 0.0–40.0)

## 2015-06-18 LAB — COMPREHENSIVE METABOLIC PANEL
ALBUMIN: 4.5 g/dL (ref 3.5–5.2)
ALT: 13 U/L (ref 0–35)
AST: 16 U/L (ref 0–37)
Alkaline Phosphatase: 74 U/L (ref 39–117)
BUN: 19 mg/dL (ref 6–23)
CALCIUM: 9.7 mg/dL (ref 8.4–10.5)
CO2: 30 mEq/L (ref 19–32)
Chloride: 104 mEq/L (ref 96–112)
Creatinine, Ser: 0.86 mg/dL (ref 0.40–1.20)
GFR: 71.22 mL/min (ref >60.00)
Glucose, Bld: 94 mg/dL (ref 70–99)
POTASSIUM: 4.5 meq/L (ref 3.5–5.1)
SODIUM: 139 meq/L (ref 135–145)
Total Bilirubin: 0.5 mg/dL (ref 0.2–1.2)
Total Protein: 7.4 g/dL (ref 6.0–8.3)

## 2015-06-18 LAB — TSH: TSH: 1.5 u[IU]/mL (ref 0.35–4.50)

## 2015-06-18 MED ORDER — ALENDRONATE SODIUM 70 MG PO TABS: ORAL_TABLET | ORAL | Status: DC

## 2015-06-18 MED ORDER — NYSTATIN 100000 UNIT/ML MT SUSP: 5.0000 mL | Freq: Four times a day (QID) | OROMUCOSAL | Status: DC

## 2015-06-18 MED ORDER — PREDNISONE 10 MG PO TABS: ORAL_TABLET | ORAL | Status: DC

## 2015-06-18 NOTE — Patient Instructions (Signed)
Preventive Care for Adults, Female A healthy lifestyle and preventive care can promote health and wellness. Preventive health guidelines for women include the following key practices.  A routine yearly physical is a good way to check with your health care provider about your health and preventive screening. It is a chance to share any concerns and updates on your health and to receive a thorough exam.  Visit your dentist for a routine exam and preventive care every 6 months. Brush your teeth twice a day and floss once a day. Good oral hygiene prevents tooth decay and gum disease.  The frequency of eye exams is based on your age, health, family medical history, use of contact lenses, and other factors. Follow your health care provider's recommendations for frequency of eye exams.  Eat a healthy diet. Foods like vegetables, fruits, whole grains, low-fat dairy products, and lean protein foods contain the nutrients you need without too many calories. Decrease your intake of foods high in solid fats, added sugars, and salt. Eat the right amount of calories for you.Get information about a proper diet from your health care provider, if necessary.  Regular physical exercise is one of the most important things you can do for your health. Most adults should get at least 150 minutes of moderate-intensity exercise (any activity that increases your heart rate and causes you to sweat) each week. In addition, most adults need muscle-strengthening exercises on 2 or more days a week.  Maintain a healthy weight. The body mass index (BMI) is a screening tool to identify possible weight problems. It provides an estimate of body fat based on height and weight. Your health care provider can find your BMI and can help you achieve or maintain a healthy weight.For adults 20 years and older:  A BMI below 18.5 is considered underweight.  A BMI of 18.5 to 24.9 is normal.  A BMI of 25 to 29.9 is considered overweight.  A  BMI of 30 and above is considered obese.  Maintain normal blood lipids and cholesterol levels by exercising and minimizing your intake of saturated fat. Eat a balanced diet with plenty of fruit and vegetables. Blood tests for lipids and cholesterol should begin at age 45 and be repeated every 5 years. If your lipid or cholesterol levels are high, you are over 50, or you are at high risk for heart disease, you may need your cholesterol levels checked more frequently.Ongoing high lipid and cholesterol levels should be treated with medicines if diet and exercise are not working.  If you smoke, find out from your health care provider how to quit. If you do not use tobacco, do not start.  Lung cancer screening is recommended for adults aged 45-80 years who are at high risk for developing lung cancer because of a history of smoking. A yearly low-dose CT scan of the lungs is recommended for people who have at least a 30-pack-year history of smoking and are a current smoker or have quit within the past 15 years. A pack year of smoking is smoking an average of 1 pack of cigarettes a day for 1 year (for example: 1 pack a day for 30 years or 2 packs a day for 15 years). Yearly screening should continue until the smoker has stopped smoking for at least 15 years. Yearly screening should be stopped for people who develop a health problem that would prevent them from having lung cancer treatment.  If you are pregnant, do not drink alcohol. If you are  breastfeeding, be very cautious about drinking alcohol. If you are not pregnant and choose to drink alcohol, do not have more than 1 drink per day. One drink is considered to be 12 ounces (355 mL) of beer, 5 ounces (148 mL) of wine, or 1.5 ounces (44 mL) of liquor.  Avoid use of street drugs. Do not share needles with anyone. Ask for help if you need support or instructions about stopping the use of drugs.  High blood pressure causes heart disease and increases the risk  of stroke. Your blood pressure should be checked at least every 1 to 2 years. Ongoing high blood pressure should be treated with medicines if weight loss and exercise do not work.  If you are 55-79 years old, ask your health care provider if you should take aspirin to prevent strokes.  Diabetes screening is done by taking a blood sample to check your blood glucose level after you have not eaten for a certain period of time (fasting). If you are not overweight and you do not have risk factors for diabetes, you should be screened once every 3 years starting at age 45. If you are overweight or obese and you are 40-70 years of age, you should be screened for diabetes every year as part of your cardiovascular risk assessment.  Breast cancer screening is essential preventive care for women. You should practice "breast self-awareness." This means understanding the normal appearance and feel of your breasts and may include breast self-examination. Any changes detected, no matter how small, should be reported to a health care provider. Women in their 20s and 30s should have a clinical breast exam (CBE) by a health care provider as part of a regular health exam every 1 to 3 years. After age 40, women should have a CBE every year. Starting at age 40, women should consider having a mammogram (breast X-ray test) every year. Women who have a family history of breast cancer should talk to their health care provider about genetic screening. Women at a high risk of breast cancer should talk to their health care providers about having an MRI and a mammogram every year.  Breast cancer gene (BRCA)-related cancer risk assessment is recommended for women who have family members with BRCA-related cancers. BRCA-related cancers include breast, ovarian, tubal, and peritoneal cancers. Having family members with these cancers may be associated with an increased risk for harmful changes (mutations) in the breast cancer genes BRCA1 and  BRCA2. Results of the assessment will determine the need for genetic counseling and BRCA1 and BRCA2 testing.  Your health care provider may recommend that you be screened regularly for cancer of the pelvic organs (ovaries, uterus, and vagina). This screening involves a pelvic examination, including checking for microscopic changes to the surface of your cervix (Pap test). You may be encouraged to have this screening done every 3 years, beginning at age 21.  For women ages 30-65, health care providers may recommend pelvic exams and Pap testing every 3 years, or they may recommend the Pap and pelvic exam, combined with testing for human papilloma virus (HPV), every 5 years. Some types of HPV increase your risk of cervical cancer. Testing for HPV may also be done on women of any age with unclear Pap test results.  Other health care providers may not recommend any screening for nonpregnant women who are considered low risk for pelvic cancer and who do not have symptoms. Ask your health care provider if a screening pelvic exam is right for   you.  If you have had past treatment for cervical cancer or a condition that could lead to cancer, you need Pap tests and screening for cancer for at least 20 years after your treatment. If Pap tests have been discontinued, your risk factors (such as having a new sexual partner) need to be reassessed to determine if screening should resume. Some women have medical problems that increase the chance of getting cervical cancer. In these cases, your health care provider may recommend more frequent screening and Pap tests.  Colorectal cancer can be detected and often prevented. Most routine colorectal cancer screening begins at the age of 50 years and continues through age 75 years. However, your health care provider may recommend screening at an earlier age if you have risk factors for colon cancer. On a yearly basis, your health care provider may provide home test kits to check  for hidden blood in the stool. Use of a small camera at the end of a tube, to directly examine the colon (sigmoidoscopy or colonoscopy), can detect the earliest forms of colorectal cancer. Talk to your health care provider about this at age 50, when routine screening begins. Direct exam of the colon should be repeated every 5-10 years through age 75 years, unless early forms of precancerous polyps or small growths are found.  People who are at an increased risk for hepatitis B should be screened for this virus. You are considered at high risk for hepatitis B if:  You were born in a country where hepatitis B occurs often. Talk with your health care provider about which countries are considered high risk.  Your parents were born in a high-risk country and you have not received a shot to protect against hepatitis B (hepatitis B vaccine).  You have HIV or AIDS.  You use needles to inject street drugs.  You live with, or have sex with, someone who has hepatitis B.  You get hemodialysis treatment.  You take certain medicines for conditions like cancer, organ transplantation, and autoimmune conditions.  Hepatitis C blood testing is recommended for all people born from 1945 through 1965 and any individual with known risks for hepatitis C.  Practice safe sex. Use condoms and avoid high-risk sexual practices to reduce the spread of sexually transmitted infections (STIs). STIs include gonorrhea, chlamydia, syphilis, trichomonas, herpes, HPV, and human immunodeficiency virus (HIV). Herpes, HIV, and HPV are viral illnesses that have no cure. They can result in disability, cancer, and death.  You should be screened for sexually transmitted illnesses (STIs) including gonorrhea and chlamydia if:  You are sexually active and are younger than 24 years.  You are older than 24 years and your health care provider tells you that you are at risk for this type of infection.  Your sexual activity has changed  since you were last screened and you are at an increased risk for chlamydia or gonorrhea. Ask your health care provider if you are at risk.  If you are at risk of being infected with HIV, it is recommended that you take a prescription medicine daily to prevent HIV infection. This is called preexposure prophylaxis (PrEP). You are considered at risk if:  You are sexually active and do not regularly use condoms or know the HIV status of your partner(s).  You take drugs by injection.  You are sexually active with a partner who has HIV.  Talk with your health care provider about whether you are at high risk of being infected with HIV. If   you choose to begin PrEP, you should first be tested for HIV. You should then be tested every 3 months for as long as you are taking PrEP.  Osteoporosis is a disease in which the bones lose minerals and strength with aging. This can result in serious bone fractures or breaks. The risk of osteoporosis can be identified using a bone density scan. Women ages 67 years and over and women at risk for fractures or osteoporosis should discuss screening with their health care providers. Ask your health care provider whether you should take a calcium supplement or vitamin D to reduce the rate of osteoporosis.  Menopause can be associated with physical symptoms and risks. Hormone replacement therapy is available to decrease symptoms and risks. You should talk to your health care provider about whether hormone replacement therapy is right for you.  Use sunscreen. Apply sunscreen liberally and repeatedly throughout the day. You should seek shade when your shadow is shorter than you. Protect yourself by wearing long sleeves, pants, a wide-brimmed hat, and sunglasses year round, whenever you are outdoors.  Once a month, do a whole body skin exam, using a mirror to look at the skin on your back. Tell your health care provider of new moles, moles that have irregular borders, moles that  are larger than a pencil eraser, or moles that have changed in shape or color.  Stay current with required vaccines (immunizations).  Influenza vaccine. All adults should be immunized every year.  Tetanus, diphtheria, and acellular pertussis (Td, Tdap) vaccine. Pregnant women should receive 1 dose of Tdap vaccine during each pregnancy. The dose should be obtained regardless of the length of time since the last dose. Immunization is preferred during the 27th-36th week of gestation. An adult who has not previously received Tdap or who does not know her vaccine status should receive 1 dose of Tdap. This initial dose should be followed by tetanus and diphtheria toxoids (Td) booster doses every 10 years. Adults with an unknown or incomplete history of completing a 3-dose immunization series with Td-containing vaccines should begin or complete a primary immunization series including a Tdap dose. Adults should receive a Td booster every 10 years.  Varicella vaccine. An adult without evidence of immunity to varicella should receive 2 doses or a second dose if she has previously received 1 dose. Pregnant females who do not have evidence of immunity should receive the first dose after pregnancy. This first dose should be obtained before leaving the health care facility. The second dose should be obtained 4-8 weeks after the first dose.  Human papillomavirus (HPV) vaccine. Females aged 13-26 years who have not received the vaccine previously should obtain the 3-dose series. The vaccine is not recommended for use in pregnant females. However, pregnancy testing is not needed before receiving a dose. If a female is found to be pregnant after receiving a dose, no treatment is needed. In that case, the remaining doses should be delayed until after the pregnancy. Immunization is recommended for any person with an immunocompromised condition through the age of 61 years if she did not get any or all doses earlier. During the  3-dose series, the second dose should be obtained 4-8 weeks after the first dose. The third dose should be obtained 24 weeks after the first dose and 16 weeks after the second dose.  Zoster vaccine. One dose is recommended for adults aged 30 years or older unless certain conditions are present.  Measles, mumps, and rubella (MMR) vaccine. Adults born  before 1957 generally are considered immune to measles and mumps. Adults born in 1957 or later should have 1 or more doses of MMR vaccine unless there is a contraindication to the vaccine or there is laboratory evidence of immunity to each of the three diseases. A routine second dose of MMR vaccine should be obtained at least 28 days after the first dose for students attending postsecondary schools, health care workers, or international travelers. People who received inactivated measles vaccine or an unknown type of measles vaccine during 1963-1967 should receive 2 doses of MMR vaccine. People who received inactivated mumps vaccine or an unknown type of mumps vaccine before 1979 and are at high risk for mumps infection should consider immunization with 2 doses of MMR vaccine. For females of childbearing age, rubella immunity should be determined. If there is no evidence of immunity, females who are not pregnant should be vaccinated. If there is no evidence of immunity, females who are pregnant should delay immunization until after pregnancy. Unvaccinated health care workers born before 1957 who lack laboratory evidence of measles, mumps, or rubella immunity or laboratory confirmation of disease should consider measles and mumps immunization with 2 doses of MMR vaccine or rubella immunization with 1 dose of MMR vaccine.  Pneumococcal 13-valent conjugate (PCV13) vaccine. When indicated, a person who is uncertain of his immunization history and has no record of immunization should receive the PCV13 vaccine. All adults 65 years of age and older should receive this  vaccine. An adult aged 19 years or older who has certain medical conditions and has not been previously immunized should receive 1 dose of PCV13 vaccine. This PCV13 should be followed with a dose of pneumococcal polysaccharide (PPSV23) vaccine. Adults who are at high risk for pneumococcal disease should obtain the PPSV23 vaccine at least 8 weeks after the dose of PCV13 vaccine. Adults older than 62 years of age who have normal immune system function should obtain the PPSV23 vaccine dose at least 1 year after the dose of PCV13 vaccine.  Pneumococcal polysaccharide (PPSV23) vaccine. When PCV13 is also indicated, PCV13 should be obtained first. All adults aged 65 years and older should be immunized. An adult younger than age 65 years who has certain medical conditions should be immunized. Any person who resides in a nursing home or long-term care facility should be immunized. An adult smoker should be immunized. People with an immunocompromised condition and certain other conditions should receive both PCV13 and PPSV23 vaccines. People with human immunodeficiency virus (HIV) infection should be immunized as soon as possible after diagnosis. Immunization during chemotherapy or radiation therapy should be avoided. Routine use of PPSV23 vaccine is not recommended for American Indians, [place] Natives, or people younger than 65 years unless there are medical conditions that require PPSV23 vaccine. When indicated, people who have unknown immunization and have no record of immunization should receive PPSV23 vaccine. One-time revaccination 5 years after the first dose of PPSV23 is recommended for people aged 19-64 years who have chronic kidney failure, nephrotic syndrome, asplenia, or immunocompromised conditions. People who received 1-2 doses of PPSV23 before age 65 years should receive another dose of PPSV23 vaccine at age 65 years or later if at least 5 years have passed since the previous dose. Doses of PPSV23 are not  needed for people immunized with PPSV23 at or after age 65 years.  Meningococcal vaccine. Adults with asplenia or persistent complement component deficiencies should receive 2 doses of quadrivalent meningococcal conjugate (MenACWY-D) vaccine. The doses should be obtained   at least 2 months apart. Microbiologists working with certain meningococcal bacteria, [place] recruits, people at risk during an outbreak, and people who travel to or live in countries with a high rate of meningitis should be immunized. A first-year college student up through age 34 years who is living in a residence hall should receive a dose if she did not receive a dose on or after her 16th birthday. Adults who have certain high-risk conditions should receive one or more doses of vaccine.  Hepatitis A vaccine. Adults who wish to be protected from this disease, have certain high-risk conditions, work with hepatitis A-infected animals, work in hepatitis A research labs, or travel to or work in countries with a high rate of hepatitis A should be immunized. Adults who were previously unvaccinated and who anticipate close contact with an international adoptee during the first 60 days after arrival in the [place] from a country with a high rate of hepatitis A should be immunized.  Hepatitis B vaccine. Adults who wish to be protected from this disease, have certain high-risk conditions, may be exposed to blood or other infectious body fluids, are household contacts or sex partners of hepatitis B positive people, are clients or workers in certain care facilities, or travel to or work in countries with a high rate of hepatitis B should be immunized.  Haemophilus influenzae type b (Hib) vaccine. A previously unvaccinated person with asplenia or sickle cell disease or having a scheduled splenectomy should receive 1 dose of Hib vaccine. Regardless of previous immunization, a recipient of a hematopoietic stem cell transplant should receive a  3-dose series 6-12 months after her successful transplant. Hib vaccine is not recommended for adults with HIV infection. Preventive Services / Frequency Ages 35 to 4 years  Blood pressure check.** / Every 3-5 years.  Lipid and cholesterol check.** / Every 5 years beginning at age 60.  Clinical breast exam.** / Every 3 years for women in their 71s and 10s.  BRCA-related cancer risk assessment.** / For women who have family members with a BRCA-related cancer (breast, ovarian, tubal, or peritoneal cancers).  Pap test.** / Every 2 years from ages 76 through 26. Every 3 years starting at age 61 through age 76 or 93 with a history of 3 consecutive normal Pap tests.  HPV screening.** / Every 3 years from ages 37 through ages 60 to 51 with a history of 3 consecutive normal Pap tests.  Hepatitis C blood test.** / For any individual with known risks for hepatitis C.  Skin self-exam. / Monthly.  Influenza vaccine. / Every year.  Tetanus, diphtheria, and acellular pertussis (Tdap, Td) vaccine.** / Consult your health care provider. Pregnant women should receive 1 dose of Tdap vaccine during each pregnancy. 1 dose of Td every 10 years.  Varicella vaccine.** / Consult your health care provider. Pregnant females who do not have evidence of immunity should receive the first dose after pregnancy.  HPV vaccine. / 3 doses over 6 months, if 93 and younger. The vaccine is not recommended for use in pregnant females. However, pregnancy testing is not needed before receiving a dose.  Measles, mumps, rubella (MMR) vaccine.** / You need at least 1 dose of MMR if you were born in 1957 or later. You may also need a 2nd dose. For females of childbearing age, rubella immunity should be determined. If there is no evidence of immunity, females who are not pregnant should be vaccinated. If there is no evidence of immunity, females who are  pregnant should delay immunization until after pregnancy.  Pneumococcal  13-valent conjugate (PCV13) vaccine.** / Consult your health care provider.  Pneumococcal polysaccharide (PPSV23) vaccine.** / 1 to 2 doses if you smoke cigarettes or if you have certain conditions.  Meningococcal vaccine.** / 1 dose if you are age 68 to 8 years and a Market researcher living in a residence hall, or have one of several medical conditions, you need to get vaccinated against meningococcal disease. You may also need additional booster doses.  Hepatitis A vaccine.** / Consult your health care provider.  Hepatitis B vaccine.** / Consult your health care provider.  Haemophilus influenzae type b (Hib) vaccine.** / Consult your health care provider. Ages 7 to 53 years  Blood pressure check.** / Every year.  Lipid and cholesterol check.** / Every 5 years beginning at age 25 years.  Lung cancer screening. / Every year if you are aged 11-80 years and have a 30-pack-year history of smoking and currently smoke or have quit within the past 15 years. Yearly screening is stopped once you have quit smoking for at least 15 years or develop a health problem that would prevent you from having lung cancer treatment.  Clinical breast exam.** / Every year after age 48 years.  BRCA-related cancer risk assessment.** / For women who have family members with a BRCA-related cancer (breast, ovarian, tubal, or peritoneal cancers).  Mammogram.** / Every year beginning at age 41 years and continuing for as long as you are in good health. Consult with your health care provider.  Pap test.** / Every 3 years starting at age 65 years through age 37 or 70 years with a history of 3 consecutive normal Pap tests.  HPV screening.** / Every 3 years from ages 72 years through ages 60 to 40 years with a history of 3 consecutive normal Pap tests.  Fecal occult blood test (FOBT) of stool. / Every year beginning at age 21 years and continuing until age 5 years. You may not need to do this test if you get  a colonoscopy every 10 years.  Flexible sigmoidoscopy or colonoscopy.** / Every 5 years for a flexible sigmoidoscopy or every 10 years for a colonoscopy beginning at age 35 years and continuing until age 48 years.  Hepatitis C blood test.** / For all people born from [birth year] through 1965 and any individual with known risks for hepatitis C.  Skin self-exam. / Monthly.  Influenza vaccine. / Every year.  Tetanus, diphtheria, and acellular pertussis (Tdap/Td) vaccine.** / Consult your health care provider. Pregnant women should receive 1 dose of Tdap vaccine during each pregnancy. 1 dose of Td every 10 years.  Varicella vaccine.** / Consult your health care provider. Pregnant females who do not have evidence of immunity should receive the first dose after pregnancy.  Zoster vaccine.** / 1 dose for adults aged 30 years or older.  Measles, mumps, rubella (MMR) vaccine.** / You need at least 1 dose of MMR if you were born in 1957 or later. You may also need a second dose. For females of childbearing age, rubella immunity should be determined. If there is no evidence of immunity, females who are not pregnant should be vaccinated. If there is no evidence of immunity, females who are pregnant should delay immunization until after pregnancy.  Pneumococcal 13-valent conjugate (PCV13) vaccine.** / Consult your health care provider.  Pneumococcal polysaccharide (PPSV23) vaccine.** / 1 to 2 doses if you smoke cigarettes or if you have certain conditions.  Meningococcal vaccine.** /  Consult your health care provider.  Hepatitis A vaccine.** / Consult your health care provider.  Hepatitis B vaccine.** / Consult your health care provider.  Haemophilus influenzae type b (Hib) vaccine.** / Consult your health care provider. Ages 64 years and over  Blood pressure check.** / Every year.  Lipid and cholesterol check.** / Every 5 years beginning at age 23 years.  Lung cancer screening. / Every year if you  are aged 16-80 years and have a 30-pack-year history of smoking and currently smoke or have quit within the past 15 years. Yearly screening is stopped once you have quit smoking for at least 15 years or develop a health problem that would prevent you from having lung cancer treatment.  Clinical breast exam.** / Every year after age 74 years.  BRCA-related cancer risk assessment.** / For women who have family members with a BRCA-related cancer (breast, ovarian, tubal, or peritoneal cancers).  Mammogram.** / Every year beginning at age 44 years and continuing for as long as you are in good health. Consult with your health care provider.  Pap test.** / Every 3 years starting at age 58 years through age 22 or 39 years with 3 consecutive normal Pap tests. Testing can be stopped between 65 and 70 years with 3 consecutive normal Pap tests and no abnormal Pap or HPV tests in the past 10 years.  HPV screening.** / Every 3 years from ages 64 years through ages 70 or 61 years with a history of 3 consecutive normal Pap tests. Testing can be stopped between 65 and 70 years with 3 consecutive normal Pap tests and no abnormal Pap or HPV tests in the past 10 years.  Fecal occult blood test (FOBT) of stool. / Every year beginning at age 40 years and continuing until age 27 years. You may not need to do this test if you get a colonoscopy every 10 years.  Flexible sigmoidoscopy or colonoscopy.** / Every 5 years for a flexible sigmoidoscopy or every 10 years for a colonoscopy beginning at age 7 years and continuing until age 32 years.  Hepatitis C blood test.** / For all people born from [birth year] through 1965 and any individual with known risks for hepatitis C.  Osteoporosis screening.** / A one-time screening for women ages 30 years and over and women at risk for fractures or osteoporosis.  Skin self-exam. / Monthly.  Influenza vaccine. / Every year.  Tetanus, diphtheria, and acellular pertussis (Tdap/Td)  vaccine.** / 1 dose of Td every 10 years.  Varicella vaccine.** / Consult your health care provider.  Zoster vaccine.** / 1 dose for adults aged 35 years or older.  Pneumococcal 13-valent conjugate (PCV13) vaccine.** / Consult your health care provider.  Pneumococcal polysaccharide (PPSV23) vaccine.** / 1 dose for all adults aged 46 years and older.  Meningococcal vaccine.** / Consult your health care provider.  Hepatitis A vaccine.** / Consult your health care provider.  Hepatitis B vaccine.** / Consult your health care provider.  Haemophilus influenzae type b (Hib) vaccine.** / Consult your health care provider. ** Family history and personal history of risk and conditions may change your health care provider's recommendations.   This information is not intended to replace advice given to you by your health care provider. Make sure you discuss any questions you have with your health care provider.   Document Released: 07/04/2001 Document Revised: 05/29/2014 Document Reviewed: 10/03/2010 Elsevier Interactive Patient Education [employer].

## 2015-06-18 NOTE — Progress Notes (Signed)
Subjective:     Katherine Hanna is a 60 y.o. female and is here for a comprehensive physical exam. The patient reports problems - pain in R thumb/ wrist pain -- she normally is on the computer all day with a mouse. She is also c/o mouth sores.  She has a hx of this.  Normally she can eat yogurt and it helps but she has developed a milk allergy and can no longer eat it.    Social History   Social History  . Marital Status: Married    Spouse Name: N/A  . Number of Children: N/A  . Years of Education: N/A   Occupational History  . Not on file.   Social History Main Topics  . Smoking status: Never Smoker   . Smokeless tobacco: Not on file  . Alcohol Use: No  . Drug Use: No  . Sexual Activity: Not on file   Other Topics Concern  . Not on file   Social History Narrative   Health Maintenance  Topic Date Due  . Hepatitis C Screening  10/15/1955  . HIV Screening  02/15/1969  . PAP SMEAR  02/28/2015  . MAMMOGRAM  10/26/2015  . INFLUENZA VACCINE  12/21/2015  . TETANUS/TDAP  09/01/2016  . COLONOSCOPY  10/12/2019  . ZOSTAVAX  Completed    The following portions of the patient's history were reviewed and updated as appropriate:  She  has a past medical history of Arthritis; History of chicken pox; Gestational diabetes; Allergic rhinitis; Migraine; and Rheumatic fever. She  does not have any pertinent problems on file. She  has past surgical history that includes Appendectomy and Tonsillectomy. Her family history includes Cancer in her father; Coronary artery disease in her maternal grandfather; Diabetes in her father; Leukemia in her father; Lung cancer in her mother; Stroke in her father, maternal grandmother, and paternal grandmother. She  reports that she has never smoked. She does not have any smokeless tobacco history on file. She reports that she does not drink alcohol or use illicit drugs. She has a current medication list which includes the following prescription(s): albuterol,  alendronate, calcium carbonate, co-enzyme q-10, fexofenadine, misc natural products, multivitamin, turmeric, nystatin, prednisone, and qvar. Current Outpatient Prescriptions on File Prior to Visit  Medication Sig Dispense Refill  . albuterol (VENTOLIN HFA) 108 (90 BASE) MCG/ACT inhaler Inhale 2 puffs into the lungs every 6 (six) hours as needed. 1 Inhaler 1  . calcium carbonate (TUMS - DOSED IN MG ELEMENTAL CALCIUM) 500 MG chewable tablet Chew 1 tablet by mouth daily.    Marland Kitchen co-enzyme Q-10 30 MG capsule Take 30 mg by mouth daily.      . fexofenadine (ALLEGRA) 180 MG tablet Take 180 mg by mouth daily.    . Multiple Vitamin (MULTIVITAMIN) tablet Take 1 tablet by mouth daily.      Marland Kitchen QVAR 80 MCG/ACT inhaler INHALE 2 PUFFS 2 TIMES DAILY (Patient not taking: Reported on 06/26/2014) 8.7 g 2   No current facility-administered medications on file prior to visit.   She is allergic to penicillins..  Review of Systems Review of Systems  Constitutional: Negative for activity change, appetite change and fatigue.  HENT: Negative for hearing loss, congestion, tinnitus and ear discharge.  dentist q48mEyes: Negative for visual disturbance (see optho q1y -- vision corrected to 20/20 with glasses).  Respiratory: Negative for cough, chest tightness and shortness of breath.   Cardiovascular: Negative for chest pain, palpitations and leg swelling.  Gastrointestinal: Negative for abdominal  pain, diarrhea, constipation and abdominal distention.  Genitourinary: Negative for urgency, frequency, decreased urine volume and difficulty urinating.  Musculoskeletal: Negative for back pain,  and gait problem.   + pain in R thumb Skin: Negative for color change, pallor and rash.  Neurological: Negative for dizziness, light-headedness, numbness and headaches.  Hematological: Negative for adenopathy. Does not bruise/bleed easily.  Psychiatric/Behavioral: Negative for suicidal ideas, confusion, sleep disturbance, self-injury,  dysphoric mood, decreased concentration and agitation.       Objective:    BP 125/87 mmHg  Pulse 71  Temp(Src) 98.5 F (36.9 C) (Oral)  Ht '5\' 5"'$  (1.651 m)  Wt 153 lb 6.4 oz (69.582 kg)  BMI 25.53 kg/m2  SpO2 100% General appearance: alert, cooperative, appears stated age and no distress Head: Normocephalic, without obvious abnormality, atraumatic Eyes: conjunctivae/corneas clear. PERRL, EOM's intact. Fundi benign. Ears: normal TM's and external ear canals both ears Nose: Nares normal. Septum midline. Mucosa normal. No drainage or sinus tenderness. Throat: lips, mucosa, and tongue normal; teeth and gums normal Neck: no adenopathy, no carotid bruit, no JVD, supple, symmetrical, trachea midline and thyroid not enlarged, symmetric, no tenderness/mass/nodules Back: symmetric, no curvature. ROM normal. No CVA tenderness. Lungs: clear to auscultation bilaterally Breasts: normal appearance, no masses or tenderness Heart: regular rate and rhythm, S1, S2 normal, no murmur, click, rub or gallop Abdomen: soft, non-tender; bowel sounds normal; no masses,  no organomegaly Pelvic: cervix normal in appearance, external genitalia normal, no adnexal masses or tenderness, no cervical motion tenderness, rectovaginal septum normal, uterus normal size, shape, and consistency, vagina normal without discharge and pap done Extremities: extremities normal, atraumatic, no cyanosis or edema Pulses: 2+ and symmetric Skin: Skin color, texture, turgor normal. No rashes or lesions Lymph nodes: Cervical, supraclavicular, and axillary nodes normal. Neurologic: Alert and oriented X 3, normal strength and tone. Normal symmetric reflexes. Normal coordination and gait Psych- no depression, no anxiety      Assessment:    Healthy female exam.      Plan:    ghm utd  Check labs Pneum 23 given  See After Visit Summary for Counseling Recommendations   1. De Quervain's tenosynovitis, right Wear splint If no  relief -- refer to hand surgeon - predniSONE (DELTASONE) 10 MG tablet; 3 po qd for 3 days then 2 po qd for 3 days the 1 po qd for 3 days  Dispense: 18 tablet; Refill: 0  2. Need for hepatitis C screening test   - Hepatitis C antibody  3. Encounter for screening for HIV   - HIV antibody  4. Asthma, mild intermittent, uncomplicated Stable con't qvar and albuterol  5. Preventative health care See above - Comp Met (CMET) - CBC with Differential/Platelet - Lipid panel - POCT urinalysis dipstick - TSH  6. Osteoporosis  - alendronate (FOSAMAX) 70 MG tablet; TAKE 1 TABLET BY MOUTH EVERY 7 DAYS. TAKE WITH A FULL GLASS OF WATER ON AN EMPTY STOMACH  Dispense: 4 tablet; Refill: 6 - DG Bone Density; Future  7. Stomatitis Also recommended lysine lozenges - nystatin (MYCOSTATIN) 100000 UNIT/ML suspension; Take 5 mLs (500,000 Units total) by mouth 4 (four) times daily.  Dispense: 60 mL; Refill: 0

## 2015-06-18 NOTE — Progress Notes (Signed)
Pre visit review using our clinic review tool, if applicable. No additional management support is needed unless otherwise documented below in the visit note. 

## 2015-06-19 LAB — HEPATITIS C ANTIBODY: HCV AB: NEGATIVE

## 2015-06-19 LAB — HIV ANTIBODY (ROUTINE TESTING W REFLEX): HIV 1&2 Ab, 4th Generation: NONREACTIVE

## 2015-06-21 LAB — CYTOLOGY - PAP

## 2015-10-27 LAB — HM MAMMOGRAPHY

## 2015-10-27 LAB — HM DEXA SCAN

## 2015-10-29 ENCOUNTER — Encounter: Payer: Self-pay | Admitting: General Practice

## 2015-10-29 LAB — HM MAMMOGRAPHY

## 2015-11-01 ENCOUNTER — Encounter: Payer: Self-pay | Admitting: General Practice

## 2015-11-09 ENCOUNTER — Encounter: Payer: Self-pay | Admitting: Family Medicine

## 2015-11-17 ENCOUNTER — Encounter: Payer: Self-pay | Admitting: Family Medicine

## 2015-12-15 ENCOUNTER — Telehealth: Payer: Self-pay

## 2015-12-15 DIAGNOSIS — M810 Age-related osteoporosis without current pathological fracture: Secondary | ICD-10-CM

## 2015-12-15 NOTE — Telephone Encounter (Signed)
BMD received, + Osteoporosis, slight improvement but still Osteoporosis, per provider consider Prolia or Reclast and recheck in 2 years. VM left to call the office to discuss.     KP

## 2015-12-15 NOTE — Telephone Encounter (Signed)
Discussed with patient and she would like to try Prolia, she would like information mailed to her to review and she will call me back with her decision. Prolia information brochure mailed and I will verify her benefits via the Prolia protal.    KP

## 2015-12-21 NOTE — Telephone Encounter (Addendum)
Patient would like to discuss information in the brochure best [phone]

## 2015-12-22 NOTE — Telephone Encounter (Signed)
Prolia benefits verified--I reviewed with patient and advise, the provider is in network for patient's plan. Admin, Prolia and Office visit, if billed, are subject to a $1500 deductible ($296.94 met), 20%co-insurance, up to a $4500 out pf pocket max ($296.94 met-deductible included). Once met coverage increases to 100% of the contracted rate. No referral required.  Patient would like to get the injection after [id number] but she will call to schedule Prolia. BMP scheduled for Monday.

## 2015-12-22 NOTE — Telephone Encounter (Signed)
Pt returning CMA's call.

## 2015-12-23 NOTE — Addendum Note (Signed)
Addended by: Ewing Schlein on: 12/23/2015 06:12 PM   Modules accepted: Orders

## 2015-12-24 ENCOUNTER — Other Ambulatory Visit (INDEPENDENT_AMBULATORY_CARE_PROVIDER_SITE_OTHER): Payer: BLUE CROSS/BLUE SHIELD

## 2015-12-24 DIAGNOSIS — M810 Age-related osteoporosis without current pathological fracture: Secondary | ICD-10-CM | POA: Diagnosis not present

## 2015-12-24 LAB — BASIC METABOLIC PANEL
BUN: 17 mg/dL (ref 6–23)
CALCIUM: 9.8 mg/dL (ref 8.4–10.5)
CO2: 30 meq/L (ref 19–32)
CREATININE: 0.94 mg/dL (ref 0.40–1.20)
Chloride: 105 mEq/L (ref 96–112)
GFR: 64.16 mL/min (ref >60.00)
Glucose, Bld: 95 mg/dL (ref 70–99)
Potassium: 3.8 mEq/L (ref 3.5–5.1)
SODIUM: 143 meq/L (ref 135–145)

## 2016-01-05 ENCOUNTER — Telehealth: Payer: Self-pay | Admitting: Family Medicine

## 2016-01-05 NOTE — Telephone Encounter (Signed)
Pt is called in for lab results.   CB: [phone]

## 2016-01-06 NOTE — Telephone Encounter (Signed)
Notes Recorded by Ann Held, DO on 12/24/2015 at 9:15 PM EDT normal

## 2016-01-06 NOTE — Telephone Encounter (Signed)
Apt scheduled for 01/11/16 for th Prolia injection.    KP

## 2016-01-11 ENCOUNTER — Ambulatory Visit (INDEPENDENT_AMBULATORY_CARE_PROVIDER_SITE_OTHER): Payer: BLUE CROSS/BLUE SHIELD | Admitting: Behavioral Health

## 2016-01-11 DIAGNOSIS — M810 Age-related osteoporosis without current pathological fracture: Secondary | ICD-10-CM | POA: Diagnosis not present

## 2016-01-11 MED ORDER — DENOSUMAB 60 MG/ML ~~LOC~~ SOLN
60.0000 mg | Freq: Once | SUBCUTANEOUS | Status: AC
  Administered 2016-01-11: 60 mg via SUBCUTANEOUS

## 2016-01-11 NOTE — Progress Notes (Signed)
Pre visit review using our clinic review tool, if applicable. No additional management support is needed unless otherwise documented below in the visit note.  Patient in clinic today for Prolia injection. SQ given in Left Arm. Patient tolerated injection well. No signs or symptoms of a reaction prior to leaving the nurse visit.

## 2016-06-27 ENCOUNTER — Encounter: Payer: Self-pay | Admitting: Family Medicine

## 2016-06-27 ENCOUNTER — Ambulatory Visit (INDEPENDENT_AMBULATORY_CARE_PROVIDER_SITE_OTHER): Payer: BLUE CROSS/BLUE SHIELD | Admitting: Family Medicine

## 2016-06-27 VITALS — BP 110/76 | HR 65 | Temp 98.0°F | Ht 65.0 in | Wt 155.0 lb

## 2016-06-27 DIAGNOSIS — E559 Vitamin D deficiency, unspecified: Secondary | ICD-10-CM | POA: Diagnosis not present

## 2016-06-27 DIAGNOSIS — Z0000 Encounter for general adult medical examination without abnormal findings: Secondary | ICD-10-CM

## 2016-06-27 DIAGNOSIS — M810 Age-related osteoporosis without current pathological fracture: Secondary | ICD-10-CM | POA: Diagnosis not present

## 2016-06-27 LAB — POCT URINALYSIS DIPSTICK
Bilirubin, UA: NEGATIVE
Blood, UA: NEGATIVE
Glucose, UA: NEGATIVE
Ketones, UA: NEGATIVE
LEUKOCYTES UA: NEGATIVE
NITRITE UA: NEGATIVE
PH UA: 6
Protein, UA: NEGATIVE
Spec Grav, UA: 1.02
Urobilinogen, UA: NEGATIVE

## 2016-06-27 LAB — COMPREHENSIVE METABOLIC PANEL
ALBUMIN: 4.4 g/dL (ref 3.5–5.2)
ALK PHOS: 58 U/L (ref 39–117)
ALT: 16 U/L (ref 0–35)
AST: 20 U/L (ref 0–37)
BILIRUBIN TOTAL: 0.5 mg/dL (ref 0.2–1.2)
BUN: 19 mg/dL (ref 6–23)
CALCIUM: 9.4 mg/dL (ref 8.4–10.5)
CO2: 30 meq/L (ref 19–32)
CREATININE: 0.82 mg/dL (ref 0.40–1.20)
Chloride: 104 mEq/L (ref 96–112)
GFR: 74.99 mL/min (ref >60.00)
Glucose, Bld: 84 mg/dL (ref 70–99)
Potassium: 4 mEq/L (ref 3.5–5.1)
Sodium: 137 mEq/L (ref 135–145)
TOTAL PROTEIN: 7.4 g/dL (ref 6.0–8.3)

## 2016-06-27 LAB — TSH: TSH: 1.74 u[IU]/mL (ref 0.35–4.50)

## 2016-06-27 LAB — LIPID PANEL
CHOLESTEROL: 206 mg/dL — AB (ref 0–200)
HDL: 52.7 mg/dL (ref >39.00)
LDL Cholesterol: 139 mg/dL — ABNORMAL HIGH (ref 0–99)
NonHDL: 153.48
TRIGLYCERIDES: 70 mg/dL (ref 0.0–149.0)
Total CHOL/HDL Ratio: 4
VLDL: 14 mg/dL (ref 0.0–40.0)

## 2016-06-27 LAB — CBC
HCT: 39.2 % (ref 36.0–46.0)
HEMOGLOBIN: 13.2 g/dL (ref 12.0–15.0)
MCHC: 33.8 g/dL (ref 30.0–36.0)
MCV: 93.1 fl (ref 78.0–100.0)
PLATELETS: 274 10*3/uL (ref 150.0–400.0)
RBC: 4.21 Mil/uL (ref 3.87–5.11)
RDW: 13 % (ref 11.5–15.5)
WBC: 5.7 10*3/uL (ref 4.0–10.5)

## 2016-06-27 LAB — VITAMIN D 25 HYDROXY (VIT D DEFICIENCY, FRACTURES): VITD: 58.77 ng/mL (ref 30.00–100.00)

## 2016-06-27 MED ORDER — PREDNISONE 10 MG PO TABS: ORAL_TABLET | ORAL | 0 refills | Status: DC

## 2016-06-27 NOTE — Progress Notes (Signed)
Pre visit review using our clinic review tool, if applicable. No additional management support is needed unless otherwise documented below in the visit note. 

## 2016-06-27 NOTE — Progress Notes (Signed)
Patient ID: Katherine Hanna, female   DOB: 1955/10/21, 61 y.o.   MRN: [id number]   Subjective:    Patient ID: Katherine Hanna, female    DOB: 21-Aug-1955, 61 y.o.   MRN: [id number]  Chief Complaint  Patient presents with  . Annual Exam    HPI  Patient is in today for an annual examination. Patient states that there is still some discomfort with her right thumb. No additional acute concerns noted at this time.  I acted as a Education administrator for [employer], DO. Raiford Noble, [place]   Past Medical History:  Diagnosis Date  . Allergic rhinitis   . Arthritis    knee  . Gestational diabetes   . History of chicken pox   . Migraine   . Rheumatic fever    as a child.    Past Surgical History:  Procedure Laterality Date  . APPENDECTOMY    . TONSILLECTOMY      Family History  Problem Relation Age of Onset  . Coronary artery disease Maternal Grandfather   . Diabetes Father   . Stroke Father   . Stroke Maternal Grandmother     66s  . Stroke Paternal Grandmother   . Lung cancer Mother   . Cancer Father     bladder  . Leukemia Father     Social History   Social History  . Marital status: Married    Spouse name: N/A  . Number of children: N/A  . Years of education: N/A   Occupational History  . Not on file.   Social History Main Topics  . Smoking status: Never Smoker  . Smokeless tobacco: Never Used  . Alcohol use No  . Drug use: No  . Sexual activity: Not on file   Other Topics Concern  . Not on file   Social History Narrative  . No narrative on file    Outpatient Medications Prior to Visit  Medication Sig Dispense Refill  . albuterol (VENTOLIN HFA) 108 (90 BASE) MCG/ACT inhaler Inhale 2 puffs into the lungs every 6 (six) hours as needed. 1 Inhaler 1  . calcium carbonate (TUMS - DOSED IN MG ELEMENTAL CALCIUM) 500 MG chewable tablet Chew 1 tablet by mouth daily.    Marland Kitchen co-enzyme Q-10 30 MG capsule Take 30 mg by mouth daily.      . fexofenadine (ALLEGRA) 180 MG tablet Take  180 mg by mouth daily.    . Misc Natural Products (TART CHERRY ADVANCED PO) Take by mouth.    . Multiple Vitamin (MULTIVITAMIN) tablet Take 1 tablet by mouth daily.      . TURMERIC PO Take by mouth.    Marland Kitchen alendronate (FOSAMAX) 70 MG tablet TAKE 1 TABLET BY MOUTH EVERY 7 DAYS. TAKE WITH A FULL GLASS OF WATER ON AN EMPTY STOMACH (Patient not taking: Reported on 06/27/2016) 4 tablet 6  . nystatin (MYCOSTATIN) 100000 UNIT/ML suspension Take 5 mLs (500,000 Units total) by mouth 4 (four) times daily. (Patient not taking: Reported on 06/27/2016) 60 mL 0  . predniSONE (DELTASONE) 10 MG tablet 3 po qd for 3 days then 2 po qd for 3 days the 1 po qd for 3 days (Patient not taking: Reported on 06/27/2016) 18 tablet 0  . QVAR 80 MCG/ACT inhaler INHALE 2 PUFFS 2 TIMES DAILY (Patient not taking: Reported on 06/27/2016) 8.7 g 2   No facility-administered medications prior to visit.     Allergies  Allergen Reactions  . Penicillins  Review of Systems  Constitutional: Negative for fever and malaise/fatigue.  HENT: Negative for congestion.   Eyes: Negative for blurred vision.  Respiratory: Negative for cough and shortness of breath.   Cardiovascular: Negative for chest pain, palpitations and leg swelling.  Gastrointestinal: Negative for vomiting.  Musculoskeletal: Negative for back pain.  Skin: Negative for rash.  Neurological: Negative for loss of consciousness and headaches.       Objective:    Physical Exam  Constitutional: She is oriented to person, place, and time. She appears well-developed and well-nourished. No distress.  HENT:  Head: Normocephalic and atraumatic.  Eyes: Conjunctivae are normal.  Neck: Normal range of motion. No thyromegaly present.  Cardiovascular: Normal rate and regular rhythm.   Pulmonary/Chest: Effort normal and breath sounds normal. She has no wheezes.  Abdominal: Soft. Bowel sounds are normal. There is no tenderness.  Musculoskeletal: She exhibits no edema or deformity.   Neurological: She is alert and oriented to person, place, and time.  Skin: Skin is warm and dry. She is not diaphoretic.  Psychiatric: She has a normal mood and affect.    BP 110/76 (BP Location: Left Arm, Patient Position: Sitting, Cuff Size: Normal)   Pulse 65   Temp 98 F (36.7 C) (Oral)   Ht 5\' 5"  (1.651 m)   Wt 155 lb (70.3 kg)   SpO2 99% Comment: RA  BMI 25.79 kg/m  Wt Readings from Last 3 Encounters:  06/27/16 155 lb (70.3 kg)  06/18/15 153 lb 6.4 oz (69.6 kg)  06/26/14 160 lb 9.6 oz (72.8 kg)     Lab Results  Component Value Date   WBC 5.7 06/27/2016   HGB 13.2 06/27/2016   HCT 39.2 06/27/2016   PLT 274.0 06/27/2016   GLUCOSE 84 06/27/2016   CHOL 206 (H) 06/27/2016   TRIG 70.0 06/27/2016   HDL 52.70 06/27/2016   LDLDIRECT 140.8 02/28/2012   LDLCALC 139 (H) 06/27/2016   ALT 16 06/27/2016   AST 20 06/27/2016   NA 137 06/27/2016   K 4.0 06/27/2016   CL 104 06/27/2016   CREATININE 0.82 06/27/2016   BUN 19 06/27/2016   CO2 30 06/27/2016   TSH 1.74 06/27/2016    Lab Results  Component Value Date   TSH 1.74 06/27/2016   Lab Results  Component Value Date   WBC 5.7 06/27/2016   HGB 13.2 06/27/2016   HCT 39.2 06/27/2016   MCV 93.1 06/27/2016   PLT 274.0 06/27/2016   Lab Results  Component Value Date   NA 137 06/27/2016   K 4.0 06/27/2016   CO2 30 06/27/2016   GLUCOSE 84 06/27/2016   BUN 19 06/27/2016   CREATININE 0.82 06/27/2016   BILITOT 0.5 06/27/2016   ALKPHOS 58 06/27/2016   AST 20 06/27/2016   ALT 16 06/27/2016   PROT 7.4 06/27/2016   ALBUMIN 4.4 06/27/2016   CALCIUM 9.4 06/27/2016   GFR 74.99 06/27/2016   Lab Results  Component Value Date   CHOL 206 (H) 06/27/2016   Lab Results  Component Value Date   HDL 52.70 06/27/2016   Lab Results  Component Value Date   LDLCALC 139 (H) 06/27/2016   Lab Results  Component Value Date   TRIG 70.0 06/27/2016   Lab Results  Component Value Date   CHOLHDL 4 06/27/2016   No results  found for: HGBA1C     Assessment & Plan:   Problem List Items Addressed This Visit      Unprioritized   Osteoporosis  Relevant Orders   Comprehensive metabolic panel (Completed)   Preventative health care - Primary    ghm utd Check labs See AVS      Relevant Orders   CBC (Completed)   Comprehensive metabolic panel (Completed)   Lipid panel (Completed)   TSH (Completed)   VITAMIN D 25 Hydroxy (Vit-D Deficiency, Fractures) (Completed)   POCT Urinalysis Dipstick (Completed)   Vitamin D deficiency   Relevant Orders   VITAMIN D 25 Hydroxy (Vit-D Deficiency, Fractures) (Completed)      I have discontinued Ms. Sweetin's QVAR, predniSONE, alendronate, and nystatin. I am also having her start on predniSONE. Additionally, I am having her maintain her multivitamin, co-enzyme Q-10, albuterol, fexofenadine, calcium carbonate, TURMERIC PO, and Misc Natural Products (TART CHERRY ADVANCED PO).  Meds ordered this encounter  Medications  . predniSONE (DELTASONE) 10 MG tablet    Sig: TAKE 3 TABLETS PO QD FOR 3 DAYS THEN TAKE 2 TABLETS PO QD FOR 3 DAYS THEN TAKE 1 TABLET PO QD FOR 3 DAYS THEN TAKE 1/2 TAB PO QD FOR 3 DAYS    Dispense:  20 tablet    Refill:  0    CMA served as Education administrator during this visit. History, Physical and Plan performed by medical provider. Documentation and orders reviewed and attested to.  Ann Held, DO

## 2016-06-27 NOTE — Patient Instructions (Signed)

## 2016-06-28 DIAGNOSIS — E559 Vitamin D deficiency, unspecified: Secondary | ICD-10-CM | POA: Insufficient documentation

## 2016-06-28 NOTE — Assessment & Plan Note (Signed)
ghm utd Check labs See AVS 

## 2016-07-03 ENCOUNTER — Other Ambulatory Visit: Payer: Self-pay | Admitting: Family Medicine

## 2016-07-03 DIAGNOSIS — E785 Hyperlipidemia, unspecified: Secondary | ICD-10-CM

## 2016-07-14 ENCOUNTER — Telehealth: Payer: Self-pay | Admitting: Family Medicine

## 2016-07-14 NOTE — Telephone Encounter (Signed)
Relation to [id number] Call back [phone] Pharmacy:  Reason for call:  Patient would like to schedule prolia injection, requesting orders, please advise

## 2016-07-14 NOTE — Telephone Encounter (Signed)
[place] does this--- pt needs prior approval and cmp

## 2016-08-01 NOTE — Telephone Encounter (Signed)
Benefits verified for Prolia, PA in required, paperwork given to Ashlee to complete  Injection, admin fee and OV (if billed) are subject to $1500 deductible with 20% co-insurance

## 2016-08-09 NOTE — Telephone Encounter (Addendum)
Paperwork completed and forwarded to PCP for review and signature on 08/08/16.

## 2016-11-01 ENCOUNTER — Encounter: Payer: Self-pay | Admitting: Family Medicine

## 2016-11-01 ENCOUNTER — Telehealth: Payer: Self-pay | Admitting: *Deleted

## 2016-11-01 NOTE — Telephone Encounter (Signed)
Received Physician Orders from [place], forwarded to provider/SLS 06/13

## 2016-11-02 ENCOUNTER — Telehealth: Payer: Self-pay | Admitting: *Deleted

## 2016-11-02 NOTE — Telephone Encounter (Signed)
Received Physician Orders from [place] for Biopsy, forwarded to provider/SLS 06/14

## 2016-11-06 ENCOUNTER — Other Ambulatory Visit: Payer: Self-pay | Admitting: Radiology

## 2016-11-06 ENCOUNTER — Encounter: Payer: Self-pay | Admitting: Family Medicine

## 2017-01-05 ENCOUNTER — Other Ambulatory Visit (INDEPENDENT_AMBULATORY_CARE_PROVIDER_SITE_OTHER): Payer: BLUE CROSS/BLUE SHIELD

## 2017-01-05 DIAGNOSIS — E785 Hyperlipidemia, unspecified: Secondary | ICD-10-CM

## 2017-01-05 LAB — LIPID PANEL
CHOLESTEROL: 165 mg/dL (ref 0–200)
HDL: 50.8 mg/dL (ref >39.00)
LDL CALC: 92 mg/dL (ref 0–99)
NonHDL: 114.34
Total CHOL/HDL Ratio: 3
Triglycerides: 110 mg/dL (ref 0.0–149.0)
VLDL: 22 mg/dL (ref 0.0–40.0)

## 2017-01-05 LAB — COMPREHENSIVE METABOLIC PANEL
ALBUMIN: 4 g/dL (ref 3.5–5.2)
ALK PHOS: 74 U/L (ref 39–117)
ALT: 17 U/L (ref 0–35)
AST: 17 U/L (ref 0–37)
BILIRUBIN TOTAL: 0.6 mg/dL (ref 0.2–1.2)
BUN: 16 mg/dL (ref 6–23)
CO2: 27 mEq/L (ref 19–32)
Calcium: 9.6 mg/dL (ref 8.4–10.5)
Chloride: 106 mEq/L (ref 96–112)
Creatinine, Ser: 0.9 mg/dL (ref 0.40–1.20)
GFR: 67.24 mL/min (ref >60.00)
Glucose, Bld: 93 mg/dL (ref 70–99)
POTASSIUM: 4.2 meq/L (ref 3.5–5.1)
Sodium: 142 mEq/L (ref 135–145)
TOTAL PROTEIN: 6.7 g/dL (ref 6.0–8.3)

## 2017-01-08 NOTE — Addendum Note (Signed)
Addended by: Marjory Lies on: 01/08/2017 11:24 AM   Modules accepted: Orders

## 2017-04-02 ENCOUNTER — Telehealth: Payer: Self-pay | Admitting: *Deleted

## 2017-04-02 NOTE — Telephone Encounter (Signed)
Received Physician Orders from [facility]; forwarded to provider/SLS 11/12

## 2017-04-16 ENCOUNTER — Telehealth: Payer: Self-pay | Admitting: Family Medicine

## 2017-04-16 NOTE — Telephone Encounter (Signed)
Received patients prolia benefits PA required-formed completed $1500 deductible has been met Prolia and admin are subject to 20% co-insurance  Patient may owe approximately $220 OOP  Letter mailed informing patient of benefits  PA form completed and given to [facility] for provider signature

## 2017-04-19 ENCOUNTER — Ambulatory Visit (INDEPENDENT_AMBULATORY_CARE_PROVIDER_SITE_OTHER): Payer: BLUE CROSS/BLUE SHIELD

## 2017-04-19 DIAGNOSIS — M810 Age-related osteoporosis without current pathological fracture: Secondary | ICD-10-CM | POA: Diagnosis not present

## 2017-04-19 MED ORDER — DENOSUMAB 60 MG/ML ~~LOC~~ SOLN
60.0000 mg | Freq: Once | SUBCUTANEOUS | Status: AC
  Administered 2017-04-19: 60 mg via SUBCUTANEOUS

## 2017-04-19 NOTE — Progress Notes (Signed)
Pre visit review using our clinic tool,if applicable. No additional management support is needed unless otherwise documented below in the visit note.  

## 2017-04-19 NOTE — Progress Notes (Signed)
Pre visit review using our clinic tool,if applicable. No additional management support is needed unless otherwise documented below in the visit note.   Patient in for Prolia injection per order from Dr. Carollee Herter  Dated 08/09/16.  Patients Prolia has not come in yet and will have to be replaced when hers does arrive.  Given 60 mg SQ left arm. Patient tolerated well.  Appointment reminder card given to patient for next injection in 6 months.

## 2017-04-25 NOTE — Telephone Encounter (Signed)
Prolia approved Per Judson Roch at [facility] Ref # [id number] 04/18/17-05/21/17- 1 injection

## 2017-05-08 ENCOUNTER — Telehealth: Payer: Self-pay | Admitting: *Deleted

## 2017-05-08 LAB — HM MAMMOGRAPHY

## 2017-05-08 NOTE — Telephone Encounter (Signed)
Received Physician Orders from [facility], gave to provider MA to have signed and faxed 12/178/18; fax confirmation on desk 05/08/17 AM/SLS 12/18

## 2017-06-06 ENCOUNTER — Encounter: Payer: Self-pay | Admitting: *Deleted

## 2017-07-30 ENCOUNTER — Encounter: Payer: Self-pay | Admitting: Family Medicine

## 2017-07-30 ENCOUNTER — Ambulatory Visit (INDEPENDENT_AMBULATORY_CARE_PROVIDER_SITE_OTHER): Payer: 59 | Admitting: Family Medicine

## 2017-07-30 VITALS — BP 118/72 | HR 83 | Temp 97.9°F | Resp 16 | Ht 64.96 in | Wt 180.4 lb

## 2017-07-30 DIAGNOSIS — Z0000 Encounter for general adult medical examination without abnormal findings: Secondary | ICD-10-CM | POA: Diagnosis not present

## 2017-07-30 DIAGNOSIS — J302 Other seasonal allergic rhinitis: Secondary | ICD-10-CM

## 2017-07-30 DIAGNOSIS — Z23 Encounter for immunization: Secondary | ICD-10-CM

## 2017-07-30 LAB — COMPREHENSIVE METABOLIC PANEL
ALBUMIN: 4.3 g/dL (ref 3.5–5.2)
ALK PHOS: 64 U/L (ref 39–117)
ALT: 25 U/L (ref 0–35)
AST: 19 U/L (ref 0–37)
BUN: 16 mg/dL (ref 6–23)
CHLORIDE: 106 meq/L (ref 96–112)
CO2: 27 mEq/L (ref 19–32)
CREATININE: 0.79 mg/dL (ref 0.40–1.20)
Calcium: 9.4 mg/dL (ref 8.4–10.5)
GFR: 78.01 mL/min (ref >60.00)
GLUCOSE: 91 mg/dL (ref 70–99)
Potassium: 3.9 mEq/L (ref 3.5–5.1)
SODIUM: 140 meq/L (ref 135–145)
TOTAL PROTEIN: 7.2 g/dL (ref 6.0–8.3)
Total Bilirubin: 0.4 mg/dL (ref 0.2–1.2)

## 2017-07-30 LAB — CBC WITH DIFFERENTIAL/PLATELET
BASOS PCT: 0.4 % (ref 0.0–3.0)
Basophils Absolute: 0 10*3/uL (ref 0.0–0.1)
EOS ABS: 0.1 10*3/uL (ref 0.0–0.7)
Eosinophils Relative: 1.3 % (ref 0.0–5.0)
HCT: 40.3 % (ref 36.0–46.0)
HEMOGLOBIN: 13.8 g/dL (ref 12.0–15.0)
LYMPHS ABS: 1.8 10*3/uL (ref 0.7–4.0)
Lymphocytes Relative: 24 % (ref 12.0–46.0)
MCHC: 34.2 g/dL (ref 30.0–36.0)
MCV: 90.3 fl (ref 78.0–100.0)
MONO ABS: 0.4 10*3/uL (ref 0.1–1.0)
Monocytes Relative: 5.9 % (ref 3.0–12.0)
Neutro Abs: 5 10*3/uL (ref 1.4–7.7)
Neutrophils Relative %: 68.4 % (ref 43.0–77.0)
Platelets: 302 10*3/uL (ref 150.0–400.0)
RBC: 4.46 Mil/uL (ref 3.87–5.11)
RDW: 13.2 % (ref 11.5–15.5)
WBC: 7.3 10*3/uL (ref 4.0–10.5)

## 2017-07-30 LAB — LIPID PANEL
CHOLESTEROL: 186 mg/dL (ref 0–200)
HDL: 46.9 mg/dL (ref >39.00)
LDL CALC: 105 mg/dL — AB (ref 0–99)
NonHDL: 138.88
TRIGLYCERIDES: 171 mg/dL — AB (ref 0.0–149.0)
Total CHOL/HDL Ratio: 4
VLDL: 34.2 mg/dL (ref 0.0–40.0)

## 2017-07-30 MED ORDER — FLUTICASONE PROPIONATE 50 MCG/ACT NA SUSP: 2.0000 | Freq: Every day | NASAL | 6 refills | Status: DC

## 2017-07-30 NOTE — Assessment & Plan Note (Signed)
See AVS ghm utd Check labs 

## 2017-07-30 NOTE — Progress Notes (Signed)
Subjective:  I acted as a Education administrator for [employer]. Yancey Flemings, [place]   Patient ID: Katherine Hanna, female    DOB: [birth year], 62 y.o.   MRN: [id number]  Chief Complaint  Patient presents with  . Annual Exam    HPI  Patient is in today for annual exam.  Pt c/o b/l ear pain that is constant -- x 2 months.    Patient Care Team: Carollee Herter, Alferd Apa, DO as PCP - General (Family Medicine) Sable Feil, MD as Consulting Physician (Gastroenterology) Hollar, Katharine Look, MD as Referring Physician (Dermatology)   Past Medical History:  Diagnosis Date  . Allergic rhinitis   . Arthritis    knee  . Gestational diabetes   . History of chicken pox   . Migraine   . Rheumatic fever    as a child.    Past Surgical History:  Procedure Laterality Date  . APPENDECTOMY    . TONSILLECTOMY      Family History  Problem Relation Age of Onset  . Coronary artery disease Maternal Grandfather   . Diabetes Father   . Stroke Father   . Stroke Maternal Grandmother        64s  . Stroke Paternal Grandmother   . Lung cancer Mother   . Cancer Father        bladder  . Leukemia Father     Social History   Socioeconomic History  . Marital status: Married    Spouse name: Not on file  . Number of children: Not on file  . Years of education: Not on file  . Highest education level: Not on file  Social Needs  . Financial resource strain: Not on file  . Food insecurity - worry: Not on file  . Food insecurity - inability: Not on file  . Transportation needs - medical: Not on file  . Transportation needs - non-medical: Not on file  Occupational History  . Not on file  Tobacco Use  . Smoking status: Never Smoker  . Smokeless tobacco: Never Used  Substance and Sexual Activity  . Alcohol use: No  . Drug use: No  . Sexual activity: Not on file  Other Topics Concern  . Not on file  Social History Narrative  . Not on file    Outpatient Medications Prior to Visit  Medication Sig  Dispense Refill  . albuterol (VENTOLIN HFA) 108 (90 BASE) MCG/ACT inhaler Inhale 2 puffs into the lungs every 6 (six) hours as needed. 1 Inhaler 1  . calcium carbonate (TUMS - DOSED IN MG ELEMENTAL CALCIUM) 500 MG chewable tablet Chew 1 tablet by mouth daily.    Marland Kitchen co-enzyme Q-10 30 MG capsule Take 30 mg by mouth daily.      . fexofenadine (ALLEGRA) 180 MG tablet Take 180 mg by mouth daily.    . Misc Natural Products (TART CHERRY ADVANCED PO) Take by mouth.    . Multiple Vitamin (MULTIVITAMIN) tablet Take 1 tablet by mouth daily.      . predniSONE (DELTASONE) 10 MG tablet TAKE 3 TABLETS PO QD FOR 3 DAYS THEN TAKE 2 TABLETS PO QD FOR 3 DAYS THEN TAKE 1 TABLET PO QD FOR 3 DAYS THEN TAKE 1/2 TAB PO QD FOR 3 DAYS 20 tablet 0  . TURMERIC PO Take by mouth.     No facility-administered medications prior to visit.     Allergies  Allergen Reactions  . Penicillins     Review of Systems  Constitutional: Negative  for chills, fever and malaise/fatigue.  HENT: Positive for ear pain. Negative for congestion and hearing loss.   Eyes: Negative for blurred vision and discharge.  Respiratory: Negative for cough, sputum production and shortness of breath.   Cardiovascular: Negative for chest pain, palpitations and leg swelling.  Gastrointestinal: Negative for abdominal pain, blood in stool, constipation, diarrhea, heartburn, nausea and vomiting.  Genitourinary: Negative for dysuria, frequency, hematuria and urgency.  Musculoskeletal: Negative for back pain, falls and myalgias.  Skin: Negative for rash.  Neurological: Negative for dizziness, sensory change, loss of consciousness, weakness and headaches.  Endo/Heme/Allergies: Negative for environmental allergies. Does not bruise/bleed easily.  Psychiatric/Behavioral: Negative for depression and suicidal ideas. The patient is not nervous/anxious and does not have insomnia.        Objective:    Physical Exam  Constitutional: She is oriented to person,  place, and time. She appears well-developed and well-nourished. No distress.  HENT:  Right Ear: External ear normal. A middle ear effusion is present.  Left Ear: External ear normal. A middle ear effusion is present.  Nose: Nose normal.  Mouth/Throat: Oropharynx is clear and moist.  Eyes: EOM are normal. Pupils are equal, round, and reactive to light.  Neck: Normal range of motion. Neck supple.  Cardiovascular: Normal rate, regular rhythm and normal heart sounds.  No murmur heard. Pulmonary/Chest: Effort normal and breath sounds normal. No respiratory distress. She has no wheezes. She has no rales. She exhibits no tenderness.  Neurological: She is alert and oriented to person, place, and time.  Psychiatric: She has a normal mood and affect. Her behavior is normal. Judgment and thought content normal.  Nursing note and vitals reviewed.   BP 118/72 (BP Location: Left Arm, Patient Position: Sitting, Cuff Size: Large)   Pulse 83   Temp 97.9 F (36.6 C) (Oral)   Resp 16   Ht 5' 4.96" (1.65 m)   Wt 180 lb 6.4 oz (81.8 kg)   SpO2 97%   BMI 30.06 kg/m  Wt Readings from Last 3 Encounters:  07/30/17 180 lb 6.4 oz (81.8 kg)  06/27/16 155 lb (70.3 kg)  06/18/15 153 lb 6.4 oz (69.6 kg)   BP Readings from Last 3 Encounters:  07/30/17 118/72  06/27/16 110/76  06/18/15 125/87     Immunization History  Administered Date(s) Administered  . Hep A / Hep B 03/15/2010, 07/25/2010  . Influenza Whole 02/19/2013  . Influenza,inj,Quad PF,6+ Mos 03/13/2014  . Influenza-Unspecified 03/23/2015  . Pneumococcal Polysaccharide-23 06/18/2015  . Tdap 09/02/2006, 07/30/2017  . Zoster 04/28/2014    Health Maintenance  Topic Date Due  . Samul Dada  09/01/2016  . DEXA SCAN  10/26/2017  . MAMMOGRAM  05/08/2018  . PAP SMEAR  06/17/2018  . COLONOSCOPY  10/12/2019  . INFLUENZA VACCINE  Completed  . Hepatitis C Screening  Completed  . HIV Screening  Completed    Lab Results  Component Value Date     WBC 5.7 06/27/2016   HGB 13.2 06/27/2016   HCT 39.2 06/27/2016   PLT 274.0 06/27/2016   GLUCOSE 93 01/05/2017   CHOL 165 01/05/2017   TRIG 110.0 01/05/2017   HDL 50.80 01/05/2017   LDLDIRECT 140.8 02/28/2012   LDLCALC 92 01/05/2017   ALT 17 01/05/2017   AST 17 01/05/2017   NA 142 01/05/2017   K 4.2 01/05/2017   CL 106 01/05/2017   CREATININE 0.90 01/05/2017   BUN 16 01/05/2017   CO2 27 01/05/2017   TSH 1.74 06/27/2016  Lab Results  Component Value Date   TSH 1.74 06/27/2016   Lab Results  Component Value Date   WBC 5.7 06/27/2016   HGB 13.2 06/27/2016   HCT 39.2 06/27/2016   MCV 93.1 06/27/2016   PLT 274.0 06/27/2016   Lab Results  Component Value Date   NA 142 01/05/2017   K 4.2 01/05/2017   CO2 27 01/05/2017   GLUCOSE 93 01/05/2017   BUN 16 01/05/2017   CREATININE 0.90 01/05/2017   BILITOT 0.6 01/05/2017   ALKPHOS 74 01/05/2017   AST 17 01/05/2017   ALT 17 01/05/2017   PROT 6.7 01/05/2017   ALBUMIN 4.0 01/05/2017   CALCIUM 9.6 01/05/2017   GFR 67.24 01/05/2017   Lab Results  Component Value Date   CHOL 165 01/05/2017   Lab Results  Component Value Date   HDL 50.80 01/05/2017   Lab Results  Component Value Date   LDLCALC 92 01/05/2017   Lab Results  Component Value Date   TRIG 110.0 01/05/2017   Lab Results  Component Value Date   CHOLHDL 3 01/05/2017   No results found for: HGBA1C       Assessment & Plan:   Problem List Items Addressed This Visit      Unprioritized   Need for diphtheria-tetanus-pertussis (Tdap) vaccine   Relevant Orders   Tdap vaccine greater than or equal to 7yo IM (Completed)   Preventative health care - Primary    See AVS ghm utd Check labs      Relevant Orders   Comprehensive metabolic panel   CBC with Differential/Platelet   Lipid panel   TSH   Seasonal allergies    con't allegra Add flonase       Relevant Medications   fluticasone (FLONASE) 50 MCG/ACT nasal spray      I am having  Katherine Hanna start on fluticasone. I am also having her maintain her multivitamin, co-enzyme Q-10, albuterol, fexofenadine, calcium carbonate, TURMERIC PO, Misc Natural Products (TART CHERRY ADVANCED PO), and predniSONE.  Meds ordered this encounter  Medications  . fluticasone (FLONASE) 50 MCG/ACT nasal spray    Sig: Place 2 sprays into both nostrils daily.    Dispense:  16 g    Refill:  6    CMA served as scribe during this visit. History, Physical and Plan performed by medical provider. Documentation and orders reviewed and attested to.  Ann Held, DO

## 2017-07-30 NOTE — Assessment & Plan Note (Signed)
con't allegra Add flonase

## 2017-07-30 NOTE — Patient Instructions (Signed)
Preventive Care 40-64 Years, Female Preventive care refers to lifestyle choices and visits with your health care provider that can promote health and wellness. What does preventive care include?  A yearly physical exam. This is also called an annual well check.  Dental exams once or twice a year.  Routine eye exams. Ask your health care provider how often you should have your eyes checked.  Personal lifestyle choices, including: ? Daily care of your teeth and gums. ? Regular physical activity. ? Eating a healthy diet. ? Avoiding tobacco and drug use. ? Limiting alcohol use. ? Practicing safe sex. ? Taking low-dose aspirin daily starting at age 58. ? Taking vitamin and mineral supplements as recommended by your health care provider. What happens during an annual well check? The services and screenings done by your health care provider during your annual well check will depend on your age, overall health, lifestyle risk factors, and family history of disease. Counseling Your health care provider may ask you questions about your:  Alcohol use.  Tobacco use.  Drug use.  Emotional well-being.  Home and relationship well-being.  Sexual activity.  Eating habits.  Work and work Statistician.  Method of birth control.  Menstrual cycle.  Pregnancy history.  Screening You may have the following tests or measurements:  Height, weight, and BMI.  Blood pressure.  Lipid and cholesterol levels. These may be checked every 5 years, or more frequently if you are over 81 years old.  Skin check.  Lung cancer screening. You may have this screening every year starting at age 78 if you have a 30-pack-year history of smoking and currently smoke or have quit within the past 15 years.  Fecal occult blood test (FOBT) of the stool. You may have this test every year starting at age 65.  Flexible sigmoidoscopy or colonoscopy. You may have a sigmoidoscopy every 5 years or a colonoscopy  every 10 years starting at age 30.  Hepatitis C blood test.  Hepatitis B blood test.  Sexually transmitted disease (STD) testing.  Diabetes screening. This is done by checking your blood sugar (glucose) after you have not eaten for a while (fasting). You may have this done every 1-3 years.  Mammogram. This may be done every 1-2 years. Talk to your health care provider about when you should start having regular mammograms. This may depend on whether you have a family history of breast cancer.  BRCA-related cancer screening. This may be done if you have a family history of breast, ovarian, tubal, or peritoneal cancers.  Pelvic exam and Pap test. This may be done every 3 years starting at age 80. Starting at age 36, this may be done every 5 years if you have a Pap test in combination with an HPV test.  Bone density scan. This is done to screen for osteoporosis. You may have this scan if you are at high risk for osteoporosis.  Discuss your test results, treatment options, and if necessary, the need for more tests with your health care provider. Vaccines Your health care provider may recommend certain vaccines, such as:  Influenza vaccine. This is recommended every year.  Tetanus, diphtheria, and acellular pertussis (Tdap, Td) vaccine. You may need a Td booster every 10 years.  Varicella vaccine. You may need this if you have not been vaccinated.  Zoster vaccine. You may need this after age 5.  Measles, mumps, and rubella (MMR) vaccine. You may need at least one dose of MMR if you were born in  1957 or later. You may also need a second dose.  Pneumococcal 13-valent conjugate (PCV13) vaccine. You may need this if you have certain conditions and were not previously vaccinated.  Pneumococcal polysaccharide (PPSV23) vaccine. You may need one or two doses if you smoke cigarettes or if you have certain conditions.  Meningococcal vaccine. You may need this if you have certain  conditions.  Hepatitis A vaccine. You may need this if you have certain conditions or if you travel or work in places where you may be exposed to hepatitis A.  Hepatitis B vaccine. You may need this if you have certain conditions or if you travel or work in places where you may be exposed to hepatitis B.  Haemophilus influenzae type b (Hib) vaccine. You may need this if you have certain conditions.  Talk to your health care provider about which screenings and vaccines you need and how often you need them. This information is not intended to replace advice given to you by your health care provider. Make sure you discuss any questions you have with your health care provider. Document Released: 06/04/2015 Document Revised: 01/26/2016 Document Reviewed: 03/09/2015 Elsevier Interactive Patient Education  2018 Elsevier Inc.  

## 2017-07-31 LAB — TSH: TSH: 1.39 u[IU]/mL (ref 0.35–4.50)

## 2017-08-01 ENCOUNTER — Other Ambulatory Visit: Payer: Self-pay | Admitting: *Deleted

## 2017-08-01 DIAGNOSIS — E785 Hyperlipidemia, unspecified: Secondary | ICD-10-CM

## 2017-10-19 ENCOUNTER — Telehealth: Payer: Self-pay | Admitting: Family Medicine

## 2017-10-19 NOTE — Telephone Encounter (Signed)
Prolia benefits received PA is required- message sent to [facility] to initiate $1500 deductible (0 met)   Patient may owe approximately $1000 OOP  Patient due after 10/16/17  **Patient is eligible for [employer] copay card $25  Letter mailed to inform patient of benefits and to schedule

## 2017-10-19 NOTE — Telephone Encounter (Signed)
Unable to activate PA for Prolia. Spoke w/ [facility]- they informed me that I must call [facility]--[facility] informed me that I have to call [facility].

## 2017-10-26 ENCOUNTER — Telehealth: Payer: Self-pay | Admitting: *Deleted

## 2017-10-26 NOTE — Telephone Encounter (Signed)
Received Physician Orders from [place]; forwarded to provider/SLS 06/07

## 2017-10-29 ENCOUNTER — Encounter: Payer: Self-pay | Admitting: Family Medicine

## 2017-10-31 ENCOUNTER — Encounter: Payer: Self-pay | Admitting: Family Medicine

## 2017-10-31 ENCOUNTER — Telehealth: Payer: Self-pay | Admitting: *Deleted

## 2017-10-31 NOTE — Telephone Encounter (Signed)
Received Physician Orders from [place]; forwarded to provider/SLS 06/12

## 2017-11-01 ENCOUNTER — Telehealth: Payer: Self-pay | Admitting: *Deleted

## 2017-11-01 NOTE — Telephone Encounter (Signed)
Received Mammogram results from [place], oval mass in left breast most likely is cyst and is indeterminate. An Ultrasound is recommended; forwarded to provider/SLS 06/13

## 2017-11-02 ENCOUNTER — Telehealth: Payer: Self-pay | Admitting: *Deleted

## 2017-11-02 NOTE — Telephone Encounter (Signed)
Received Bone Density results from [facility]; forwarded to provider/SLS 06/14   

## 2017-11-12 ENCOUNTER — Telehealth: Payer: Self-pay | Admitting: *Deleted

## 2017-11-12 NOTE — Telephone Encounter (Signed)
Received Bone Density results from [facility]; forwarded to provider/SLS 06/24   

## 2017-11-20 ENCOUNTER — Encounter: Payer: Self-pay | Admitting: *Deleted

## 2018-04-02 ENCOUNTER — Encounter: Payer: Self-pay | Admitting: Family Medicine

## 2018-04-02 ENCOUNTER — Ambulatory Visit (INDEPENDENT_AMBULATORY_CARE_PROVIDER_SITE_OTHER): Payer: 59 | Admitting: Family Medicine

## 2018-04-02 VITALS — BP 137/84 | HR 90 | Temp 98.1°F | Resp 16 | Ht 64.0 in | Wt 187.2 lb

## 2018-04-02 DIAGNOSIS — R1013 Epigastric pain: Secondary | ICD-10-CM | POA: Diagnosis not present

## 2018-04-02 MED ORDER — PANTOPRAZOLE SODIUM 40 MG PO TBEC: 40.0000 mg | DELAYED_RELEASE_TABLET | Freq: Every day | ORAL | 3 refills | Status: DC

## 2018-04-02 NOTE — Progress Notes (Signed)
Patient ID: Katherine Hanna, female    DOB: Apr 12, 1956  Age: 62 y.o. MRN: [id number]    Subjective:  Subjective  HPI [name] presents for c/o midepigastric pain-- she took omeprazole about 3 weeks and it worked but when she stopped it the pain came right back.    Review of Systems  Constitutional: Negative for appetite change, diaphoresis, fatigue and unexpected weight change.  Eyes: Negative for pain, redness and visual disturbance.  Respiratory: Negative for cough, chest tightness, shortness of breath and wheezing.   Cardiovascular: Negative for chest pain, palpitations and leg swelling.  Gastrointestinal: Positive for abdominal pain. Negative for abdominal distention, diarrhea and nausea.  Endocrine: Negative for cold intolerance, heat intolerance, polydipsia, polyphagia and polyuria.  Genitourinary: Negative for difficulty urinating, dysuria and frequency.  Neurological: Negative for dizziness, light-headedness, numbness and headaches.    History Past Medical History:  Diagnosis Date  . Allergic rhinitis   . Arthritis    knee  . Gestational diabetes   . History of chicken pox   . Migraine   . Rheumatic fever    as a child.    She has a past surgical history that includes Appendectomy and Tonsillectomy.   Her family history includes Cancer in her father; Coronary artery disease in her maternal grandfather; Diabetes in her father; Leukemia in her father; Lung cancer in her mother; Stroke in her father, maternal grandmother, and paternal grandmother.She reports that she has never smoked. She has never used smokeless tobacco. She reports that she does not drink alcohol or use drugs.  Current Outpatient Medications on File Prior to Visit  Medication Sig Dispense Refill  . albuterol (VENTOLIN HFA) 108 (90 BASE) MCG/ACT inhaler Inhale 2 puffs into the lungs every 6 (six) hours as needed. 1 Inhaler 1  . calcium carbonate (TUMS - DOSED IN MG ELEMENTAL CALCIUM) 500 MG chewable  tablet Chew 1 tablet by mouth daily.    Marland Kitchen co-enzyme Q-10 30 MG capsule Take 30 mg by mouth daily.      . fexofenadine (ALLEGRA) 180 MG tablet Take 180 mg by mouth daily.    . fluticasone (FLONASE) 50 MCG/ACT nasal spray Place 2 sprays into both nostrils daily. 16 g 6  . Misc Natural Products (TART CHERRY ADVANCED PO) Take by mouth.    . Multiple Vitamin (MULTIVITAMIN) tablet Take 1 tablet by mouth daily.      . TURMERIC PO Take by mouth.     No current facility-administered medications on file prior to visit.      Objective:  Objective  Physical Exam  Constitutional: She is oriented to person, place, and time. She appears well-developed and well-nourished.  HENT:  Head: Normocephalic and atraumatic.  Eyes: Conjunctivae and EOM are normal.  Neck: Normal range of motion. Neck supple. No JVD present. Carotid bruit is not present. No thyromegaly present.  Cardiovascular: Normal rate, regular rhythm and normal heart sounds.  No murmur heard. Pulmonary/Chest: Effort normal and breath sounds normal. No respiratory distress. She has no wheezes. She has no rales. She exhibits no tenderness.  Abdominal: Soft. There is no hepatosplenomegaly, splenomegaly or hepatomegaly. There is tenderness in the epigastric area. There is no rigidity, no rebound, no guarding, no CVA tenderness, no tenderness at McBurney's point and negative Murphy's sign. No hernia.  Musculoskeletal: She exhibits no edema.  Neurological: She is alert and oriented to person, place, and time.  Psychiatric: She has a normal mood and affect.  Nursing note and vitals reviewed.  BP 137/84 (BP Location: Left Arm, Cuff Size: Large)   Pulse 90   Temp 98.1 F (36.7 C) (Oral)   Resp 16   Ht 5\' 4"  (1.626 m)   Wt 187 lb 3.2 oz (84.9 kg)   SpO2 100%   BMI 32.13 kg/m  Wt Readings from Last 3 Encounters:  04/02/18 187 lb 3.2 oz (84.9 kg)  07/30/17 180 lb 6.4 oz (81.8 kg)  06/27/16 155 lb (70.3 kg)     Lab Results  Component Value  Date   WBC 8.1 04/02/2018   HGB 13.9 04/02/2018   HCT 40.7 04/02/2018   PLT 313.0 04/02/2018   GLUCOSE 101 (H) 04/02/2018   CHOL 186 07/30/2017   TRIG 171.0 (H) 07/30/2017   HDL 46.90 07/30/2017   LDLDIRECT 140.8 02/28/2012   LDLCALC 105 (H) 07/30/2017   ALT 23 04/02/2018   AST 19 04/02/2018   NA 142 04/02/2018   K 5.0 04/02/2018   CL 106 04/02/2018   CREATININE 0.95 04/02/2018   BUN 14 04/02/2018   CO2 28 04/02/2018   TSH 1.39 07/30/2017    No results found.   Assessment & Plan:  Plan  I have discontinued Kalan D. Basden's predniSONE. I am also having her start on pantoprazole. Additionally, I am having her maintain her multivitamin, co-enzyme Q-10, albuterol, fexofenadine, calcium carbonate, TURMERIC PO, Misc Natural Products (TART CHERRY ADVANCED PO), and fluticasone.  Meds ordered this encounter  Medications  . pantoprazole (PROTONIX) 40 MG tablet    Sig: Take 1 tablet (40 mg total) by mouth daily.    Dispense:  30 tablet    Refill:  3    Problem List Items Addressed This Visit    None    Visit Diagnoses    Dyspepsia    -  Primary   Relevant Medications   pantoprazole (PROTONIX) 40 MG tablet   Other Relevant Orders   Ambulatory referral to Gastroenterology   CBC with Differential/Platelet (Completed)   H. pylori antibody, IgG (Completed)   Comprehensive metabolic panel (Completed)   IBC panel (Completed)   Ferritin (Completed)      Follow-up: Return if symptoms worsen or fail to improve.  Ann Held, DO

## 2018-04-02 NOTE — Patient Instructions (Signed)
Food Choices for Gastroesophageal Reflux Disease, Adult When you have gastroesophageal reflux disease (GERD), the foods you eat and your eating habits are very important. Choosing the right foods can help ease the discomfort of GERD. Consider working with a diet and nutrition specialist (dietitian) to help you make healthy food choices. What general guidelines should I follow? Eating plan  Choose healthy foods low in fat, such as fruits, vegetables, whole grains, low-fat dairy products, and lean meat, fish, and poultry.  Eat frequent, small meals instead of three large meals each day. Eat your meals slowly, in a relaxed setting. Avoid bending over or lying down until 2-3 hours after eating.  Limit high-fat foods such as fatty meats or fried foods.  Limit your intake of oils, butter, and shortening to less than 8 teaspoons each day.  Avoid the following: ? Foods that cause symptoms. These may be different for different people. Keep a food diary to keep track of foods that cause symptoms. ? Alcohol. ? Drinking large amounts of liquid with meals. ? Eating meals during the 2-3 hours before bed.  Cook foods using methods other than frying. This may include baking, grilling, or broiling. Lifestyle   Maintain a healthy weight. Ask your health care provider what weight is healthy for you. If you need to lose weight, work with your health care provider to do so safely.  Exercise for at least 30 minutes on 5 or more days each week, or as told by your health care provider.  Avoid wearing clothes that fit tightly around your waist and chest.  Do not use any products that contain nicotine or tobacco, such as cigarettes and e-cigarettes. If you need help quitting, ask your health care provider.  Sleep with the head of your bed raised. Use a wedge under the mattress or blocks under the bed frame to raise the head of the bed. What foods are not recommended? The items listed may not be a complete  list. Talk with your dietitian about what dietary choices are best for you. Grains Pastries or quick breads with added fat. French toast. Vegetables Deep fried vegetables. French fries. Any vegetables prepared with added fat. Any vegetables that cause symptoms. For some people this may include tomatoes and tomato products, chili peppers, onions and garlic, and horseradish. Fruits Any fruits prepared with added fat. Any fruits that cause symptoms. For some people this may include citrus fruits, such as oranges, grapefruit, pineapple, and lemons. Meats and other protein foods High-fat meats, such as fatty beef or pork, hot dogs, ribs, ham, sausage, salami and bacon. Fried meat or protein, including fried fish and fried chicken. Nuts and nut butters. Dairy Whole milk and chocolate milk. Sour cream. Cream. Ice cream. Cream cheese. Milk shakes. Beverages Coffee and tea, with or without caffeine. Carbonated beverages. Sodas. Energy drinks. Fruit juice made with acidic fruits (such as orange or grapefruit). Tomato juice. Alcoholic drinks. Fats and oils Butter. Margarine. Shortening. Ghee. Sweets and desserts Chocolate and cocoa. Donuts. Seasoning and other foods Pepper. Peppermint and spearmint. Any condiments, herbs, or seasonings that cause symptoms. For some people, this may include curry, hot sauce, or vinegar-based salad dressings. Summary  When you have gastroesophageal reflux disease (GERD), food and lifestyle choices are very important to help ease the discomfort of GERD.  Eat frequent, small meals instead of three large meals each day. Eat your meals slowly, in a relaxed setting. Avoid bending over or lying down until 2-3 hours after eating.  Limit high-fat   foods such as fatty meat or fried foods. This information is not intended to replace advice given to you by your health care provider. Make sure you discuss any questions you have with your health care provider. Document Released:  05/08/2005 Document Revised: 05/09/2016 Document Reviewed: 05/09/2016 Elsevier Interactive Patient Education  2018 Elsevier Inc.  

## 2018-04-03 LAB — CBC WITH DIFFERENTIAL/PLATELET
BASOS PCT: 1 % (ref 0.0–3.0)
Basophils Absolute: 0.1 10*3/uL (ref 0.0–0.1)
EOS ABS: 0.1 10*3/uL (ref 0.0–0.7)
Eosinophils Relative: 1 % (ref 0.0–5.0)
HCT: 40.7 % (ref 36.0–46.0)
Hemoglobin: 13.9 g/dL (ref 12.0–15.0)
Lymphocytes Relative: 26.9 % (ref 12.0–46.0)
Lymphs Abs: 2.2 10*3/uL (ref 0.7–4.0)
MCHC: 34.3 g/dL (ref 30.0–36.0)
MCV: 88.9 fl (ref 78.0–100.0)
Monocytes Absolute: 0.6 10*3/uL (ref 0.1–1.0)
Monocytes Relative: 7 % (ref 3.0–12.0)
NEUTROS ABS: 5.2 10*3/uL (ref 1.4–7.7)
Neutrophils Relative %: 64.1 % (ref 43.0–77.0)
PLATELETS: 313 10*3/uL (ref 150.0–400.0)
RBC: 4.58 Mil/uL (ref 3.87–5.11)
RDW: 14.7 % (ref 11.5–15.5)
WBC: 8.1 10*3/uL (ref 4.0–10.5)

## 2018-04-03 LAB — H. PYLORI ANTIBODY, IGG: H PYLORI IGG: NEGATIVE

## 2018-04-03 LAB — COMPREHENSIVE METABOLIC PANEL
ALBUMIN: 4.7 g/dL (ref 3.5–5.2)
ALT: 23 U/L (ref 0–35)
AST: 19 U/L (ref 0–37)
Alkaline Phosphatase: 122 U/L — ABNORMAL HIGH (ref 39–117)
BUN: 14 mg/dL (ref 6–23)
CHLORIDE: 106 meq/L (ref 96–112)
CO2: 28 mEq/L (ref 19–32)
Calcium: 10 mg/dL (ref 8.4–10.5)
Creatinine, Ser: 0.95 mg/dL (ref 0.40–1.20)
GFR: 62.92 mL/min (ref >60.00)
GLUCOSE: 101 mg/dL — AB (ref 70–99)
POTASSIUM: 5 meq/L (ref 3.5–5.1)
SODIUM: 142 meq/L (ref 135–145)
Total Bilirubin: 0.3 mg/dL (ref 0.2–1.2)
Total Protein: 7.4 g/dL (ref 6.0–8.3)

## 2018-04-03 LAB — IBC PANEL
Iron: 31 ug/dL — ABNORMAL LOW (ref 42–145)
Saturation Ratios: 7.4 % — ABNORMAL LOW (ref 20.0–50.0)
Transferrin: 299 mg/dL (ref 212.0–360.0)

## 2018-04-03 LAB — FERRITIN: Ferritin: 48.1 ng/mL (ref 10.0–291.0)

## 2018-04-11 ENCOUNTER — Telehealth: Payer: Self-pay | Admitting: *Deleted

## 2018-04-11 NOTE — Telephone Encounter (Signed)
Received Physician Orders from [place]; forwarded to provider/SLS 11/21

## 2018-04-29 ENCOUNTER — Encounter: Payer: Self-pay | Admitting: Gastroenterology

## 2018-05-02 ENCOUNTER — Encounter: Payer: Self-pay | Admitting: Family Medicine

## 2018-05-06 ENCOUNTER — Ambulatory Visit (INDEPENDENT_AMBULATORY_CARE_PROVIDER_SITE_OTHER): Payer: 59 | Admitting: Gastroenterology

## 2018-05-06 ENCOUNTER — Telehealth: Payer: Self-pay | Admitting: *Deleted

## 2018-05-06 ENCOUNTER — Encounter: Payer: Self-pay | Admitting: Gastroenterology

## 2018-05-06 VITALS — BP 134/80 | HR 80 | Ht 65.5 in | Wt 185.0 lb

## 2018-05-06 DIAGNOSIS — R1013 Epigastric pain: Secondary | ICD-10-CM | POA: Diagnosis not present

## 2018-05-06 DIAGNOSIS — D509 Iron deficiency anemia, unspecified: Secondary | ICD-10-CM | POA: Diagnosis not present

## 2018-05-06 MED ORDER — PANTOPRAZOLE SODIUM 40 MG PO TBEC: 40.0000 mg | DELAYED_RELEASE_TABLET | Freq: Every day | ORAL | 3 refills | Status: DC

## 2018-05-06 NOTE — Patient Instructions (Signed)
If you are age 64 or older, your body mass index should be between 23-30. Your Body mass index is 30.32 kg/m. If this is out of the aforementioned range listed, please consider follow up with your Primary Care Provider.  If you are age 10 or younger, your body mass index should be between 19-25. Your Body mass index is 30.32 kg/m. If this is out of the aformentioned range listed, please consider follow up with your Primary Care Provider.   You have been scheduled for an endoscopy. Please follow written instructions given to you at your visit today. If you use inhalers (even only as needed), please bring them with you on the day of your procedure. Your physician has requested that you go to www.startemmi.com and enter the access code given to you at your visit today. This web site gives a general overview about your procedure. However, you should still follow specific instructions given to you by our office regarding your preparation for the procedure.   Follow the instructions on the Hemoccult cards and mail them back to [place] when you are finished or you may take them directly to the lab in the basement of the [facility] building. We will call you with the results.    We have sent the following medications to your pharmacy for you to pick up at your convenience: Protonix 40 mg   You have been scheduled for an abdominal ultrasound at [facility] (1st floor ) on 05/13/18 at 9am. Please arrive 15 minutes prior to your appointment for registration. Make certain not to have anything to eat or drink 6 hours prior to your appointment. Should you need to reschedule your appointment, please contact radiology at [phone].. This test typically takes about 30 minutes to perform.  Thank you,  Dr. Jackquline Denmark

## 2018-05-06 NOTE — Progress Notes (Signed)
Chief Complaint: epi pain  Referring Provider:  Carollee Herter, Alferd Apa, *      ASSESSMENT AND PLAN;   #1. Epigastric pain. D/d PUD, GERD, gastritis, nonulcer dyspepsia, gastroparesis, musculoskeletal etiology, r/o gallbladder or pancreatic problems. Neg HP antibody #2.  Iron deficiency without anemia. #3.  Mildly increased alk phos. [place] pending.  Plan: -Continue Protonix '40mg'$  po qd #30, 4 refiils. -Proceed with EGD with possible SB Bx. I discussed risks and benefits. -US abdomen complete.  If still with problems, would consider HIDA scan with EF. -Stop all nonsteroidals. -Hemoccult stools x3. -Her last colonoscopy was on 09/2009.  Next due 2021.  We will perform it earlier if she still has problems or if she has heme positive stools. -Trend Alk phos. If still high, would obtain alk phos isoenzymes, GGT. HPI:    Katherine Hanna is a 62 y.o. female  With epigastric pain in July, worst since October. Worse after eating occ NSAIDs Took omeprazole and then it has been changed over to Protonix 40 mg p.o. once a day with some relief. She is some nausea but no vomiting. Denies having any melena or hematochezia. No significant diarrhea or constipation. No weight loss. Been on prednisone before which has been stopped. Has been advised to get a EGD performed. Last colonoscopy was on 09/2009 which was unremarkable.  Advised to get colonoscopy repeated in 10 years.   Past Medical History:  Diagnosis Date  . Allergic rhinitis   . Arthritis    knee  . Gestational diabetes   . History of chicken pox   . Migraine   . Rheumatic fever    as a child.    Past Surgical History:  Procedure Laterality Date  . APPENDECTOMY  11/1971  . COLONOSCOPY    . TONSILLECTOMY     when she was 2 or 3    Family History  Problem Relation Age of Onset  . Coronary artery disease Maternal Grandfather   . Diabetes Father   . Stroke Father   . Cancer Father        bladder  . Leukemia Father   .  Stroke Maternal Grandmother        75s  . Stroke Paternal Grandmother   . Lung cancer Mother   . Colon cancer Maternal Uncle   . Esophageal cancer Neg Hx     Social History  CPA Tobacco Use  . Smoking status: Never Smoker  . Smokeless tobacco: Never Used  Substance Use Topics  . Alcohol use: No  . Drug use: No    Current Outpatient Medications  Medication Sig Dispense Refill  . albuterol (VENTOLIN HFA) 108 (90 BASE) MCG/ACT inhaler Inhale 2 puffs into the lungs every 6 (six) hours as needed. 1 Inhaler 1  . Calcium Carbonate-Vitamin D (CALCIUM 500 + D PO) Take 1 tablet by mouth 2 (two) times daily.    Marland Kitchen co-enzyme Q-10 30 MG capsule Take 30 mg by mouth daily.      . fexofenadine (ALLEGRA) 180 MG tablet Take 180 mg by mouth daily.    . fluticasone (FLONASE) 50 MCG/ACT nasal spray Place 2 sprays into both nostrils daily. (Patient taking differently: Place 2 sprays into both nostrils as needed. ) 16 g 6  . Misc Natural Products (TART CHERRY ADVANCED PO) Take by mouth.    . Multiple Vitamin (MULTIVITAMIN) tablet Take 1 tablet by mouth daily.      . pantoprazole (PROTONIX) 40 MG tablet Take 1 tablet (  40 mg total) by mouth daily. 30 tablet 3  . TURMERIC PO Take by mouth.     No current facility-administered medications for this visit.     Allergies  Allergen Reactions  . Penicillins     Review of Systems:  Constitutional: Denies fever, chills, diaphoresis, appetite change and fatigue.  HEENT: Denies photophobia, eye pain, redness, hearing loss, ear pain, congestion, sore throat, rhinorrhea, sneezing, mouth sores, neck pain, neck stiffness and tinnitus.   Respiratory: Denies SOB, DOE, cough, chest tightness,  and wheezing.   Cardiovascular: Denies chest pain, palpitations and leg swelling.  Genitourinary: Denies dysuria, urgency, frequency, hematuria, flank pain and difficulty urinating.  Musculoskeletal: Denies myalgias, back pain, joint swelling, arthralgias and gait problem.    Skin: No rash.  Neurological: Denies dizziness, seizures, syncope, weakness, light-headedness, numbness and headaches.  Hematological: Denies adenopathy. Easy bruising, personal or family bleeding history  Psychiatric/Behavioral: No anxiety or depression     Physical Exam:    BP 134/80   Pulse 80   Ht 5' 5.5" (1.664 m)   Wt 185 lb (83.9 kg)   BMI 30.32 kg/m  Filed Weights   05/06/18 1527  Weight: 185 lb (83.9 kg)   Constitutional:  Well-developed, in no acute distress. Psychiatric: Normal mood and affect. Behavior is normal. HEENT: Pupils normal.  Conjunctivae are normal. No scleral icterus. Neck supple.  Cardiovascular: Normal rate, regular rhythm. No edema Pulmonary/chest: Effort normal and breath sounds normal. No wheezing, rales or rhonchi. Abdominal: Soft, nondistended.  Mild epigastric tenderness bowel sounds active throughout. There are no masses palpable. No hepatomegaly. Rectal:  defered Neurological: Alert and oriented to person place and time. Skin: Skin is warm and dry. No rashes noted.  Data Reviewed: I have personally reviewed following labs and imaging studies  CBC: CBC Latest Ref Rng & Units 04/02/2018 07/30/2017 06/27/2016  WBC 4.0 - 10.5 K/uL 8.1 7.3 5.7  Hemoglobin 12.0 - 15.0 g/dL 13.9 13.8 13.2  Hematocrit 36.0 - 46.0 % 40.7 40.3 39.2  Platelets 150.0 - 400.0 K/uL 313.0 302.0 274.0    CMP: CMP Latest Ref Rng & Units 04/02/2018 07/30/2017 01/05/2017  Glucose 70 - 99 mg/dL 101(H) 91 93  BUN 6 - 23 mg/dL '14 16 16  '$ Creatinine 0.40 - 1.20 mg/dL 0.95 0.79 0.90  Sodium 135 - 145 mEq/L 142 140 142  Potassium 3.5 - 5.1 mEq/L 5.0 3.9 4.2  Chloride 96 - 112 mEq/L 106 106 106  CO2 19 - 32 mEq/L '28 27 27  '$ Calcium 8.4 - 10.5 mg/dL 10.0 9.4 9.6  Total Protein 6.0 - 8.3 g/dL 7.4 7.2 6.7  Total Bilirubin 0.2 - 1.2 mg/dL 0.3 0.4 0.6  Alkaline Phos 39 - 117 U/L 122(H) 64 74  AST 0 - 37 U/L '19 19 17  '$ ALT 0 - 35 U/L '23 25 17    '$ Ref Range & Units 71moago  Iron 42  - 145 ug/dL 31Low    Transferrin 212.0 - 360.0 mg/dL 299.0   Saturation Ratios 20.0 - 50.0 % 7.4Low           RCarmell Austria MD 05/06/2018, 3:44 PM  Cc: LCarollee Herter YAlferd Apa *

## 2018-05-06 NOTE — Telephone Encounter (Signed)
Received Mammogram results from [facility], bilateral cysts with no sonographic evidence of malignancy, recommended f/u bilateral mammogram in 75-months forwarded to provider/SLS 12/16

## 2018-05-07 ENCOUNTER — Encounter: Payer: Self-pay | Admitting: Gastroenterology

## 2018-05-10 ENCOUNTER — Encounter: Payer: Self-pay | Admitting: Gastroenterology

## 2018-05-10 ENCOUNTER — Ambulatory Visit (AMBULATORY_SURGERY_CENTER): Payer: 59 | Admitting: Gastroenterology

## 2018-05-10 VITALS — BP 132/75 | HR 68 | Temp 97.5°F | Resp 16 | Ht 65.0 in | Wt 185.0 lb

## 2018-05-10 DIAGNOSIS — K259 Gastric ulcer, unspecified as acute or chronic, without hemorrhage or perforation: Secondary | ICD-10-CM

## 2018-05-10 DIAGNOSIS — K3189 Other diseases of stomach and duodenum: Secondary | ICD-10-CM

## 2018-05-10 DIAGNOSIS — K219 Gastro-esophageal reflux disease without esophagitis: Secondary | ICD-10-CM | POA: Diagnosis present

## 2018-05-10 DIAGNOSIS — R1013 Epigastric pain: Secondary | ICD-10-CM

## 2018-05-10 MED ORDER — SODIUM CHLORIDE 0.9 % IV SOLN: 500.0000 mL | Freq: Once | INTRAVENOUS | Status: DC

## 2018-05-10 NOTE — Progress Notes (Signed)
Report given to PACU, vss 

## 2018-05-10 NOTE — Progress Notes (Signed)
Called to room to assist during endoscopic procedure.  Patient ID and intended procedure confirmed with present staff. Received instructions for my participation in the procedure from the performing physician.  

## 2018-05-10 NOTE — Patient Instructions (Signed)
Avoid ibuprofen, naproxen, or other non-steroidal anti-inflammatory drugs.    YOU HAD AN ENDOSCOPIC PROCEDURE TODAY AT [facility]:   Refer to the procedure report that was given to you for any specific questions about what was found during the examination.  If the procedure report does not answer your questions, please call your gastroenterologist to clarify.  If you requested that your care partner not be given the details of your procedure findings, then the procedure report has been included in a sealed envelope for you to review at your convenience later.  YOU SHOULD EXPECT: Some feelings of bloating in the abdomen. Passage of more gas than usual.  Walking can help get rid of the air that was put into your GI tract during the procedure and reduce the bloating. If you had a lower endoscopy (such as a colonoscopy or flexible sigmoidoscopy) you may notice spotting of blood in your stool or on the toilet paper. If you underwent a bowel prep for your procedure, you may not have a normal bowel movement for a few days.  Please Note:  You might notice some irritation and congestion in your nose or some drainage.  This is from the oxygen used during your procedure.  There is no need for concern and it should clear up in a day or so.  SYMPTOMS TO REPORT IMMEDIATELY:   Following upper endoscopy (EGD)  Vomiting of blood or coffee ground material  New chest pain or pain under the shoulder blades  Painful or persistently difficult swallowing  New shortness of breath  Fever of 100F or higher  Black, tarry-looking stools  For urgent or emergent issues, a gastroenterologist can be reached at any hour by calling [phone].   DIET:  We do recommend a small meal at first, but then you may proceed to your regular diet.  Drink plenty of fluids but you should avoid alcoholic beverages for 24 hours.  ACTIVITY:  You should plan to take it easy for the rest of today and you should NOT  DRIVE or use heavy machinery until tomorrow (because of the sedation medicines used during the test).    FOLLOW UP: Our staff will call the number listed on your records the next business day following your procedure to check on you and address any questions or concerns that you may have regarding the information given to you following your procedure. If we do not reach you, we will leave a message.  However, if you are feeling well and you are not experiencing any problems, there is no need to return our call.  We will assume that you have returned to your regular daily activities without incident.  If any biopsies were taken you will be contacted by phone or by letter within the next 1-3 weeks.  Please call us at [phone] if you have not heard about the biopsies in 3 weeks.    SIGNATURES/CONFIDENTIALITY: You and/or your care partner have signed paperwork which will be entered into your electronic medical record.  These signatures attest to the fact that that the information above on your After Visit Summary has been reviewed and is understood.  Full responsibility of the confidentiality of this discharge information lies with you and/or your care-partner.

## 2018-05-10 NOTE — Op Note (Signed)
[facility] Patient Name: Katherine Hanna Procedure Date: 05/10/2018 2:53 PM MRN: [id number] Endoscopist: Jackquline Denmark , MD Age: 62 Referring MD:  Date of Birth: 02/24/1956 Gender: Female Account #: [id number] Procedure:                Upper GI endoscopy Indications:              #1. Epigastric pain.                           #2. Iron deficiency without anemia. Medicines:                Monitored Anesthesia Care Procedure:                Pre-Anesthesia Assessment:                           - Prior to the procedure, a History and Physical                            was performed, and patient medications and                            allergies were reviewed. The patient's tolerance of                            previous anesthesia was also reviewed. The risks                            and benefits of the procedure and the sedation                            options and risks were discussed with the patient.                            All questions were answered, and informed consent                            was obtained. Prior Anticoagulants: The patient has                            taken no previous anticoagulant or antiplatelet                            agents. ASA Grade Assessment: II - A patient with                            mild systemic disease. After reviewing the risks                            and benefits, the patient was deemed in                            satisfactory condition to undergo the procedure.  After obtaining informed consent, the endoscope was                            passed under direct vision. Throughout the                            procedure, the patient's blood pressure, pulse, and                            oxygen saturations were monitored continuously. The                            Endoscope was introduced through the mouth, and                            advanced to the second part of duodenum. The upper                           GI endoscopy was accomplished without difficulty.                            The patient tolerated the procedure well. Scope In: Scope Out: Findings:                 The Z-line was irregular and was found 35 cm from                            the incisors. Biopsies were taken with a cold                            forceps for histology from all 4 quadrants directed                            by NBI. Estimated blood loss: none.                           One non-bleeding superficial healing gastric ulcer                            with no stigmata of bleeding was found in the                            gastric antrum. The lesion was 8 mm in largest                            dimension. Biopsies were taken with a cold forceps                            for histology. Estimated blood loss was minimal.                           The examined duodenum was normal. Biopsies for  histology were taken with a cold forceps for                            evaluation of celiac disease. Estimated blood loss:                            none. Complications:            No immediate complications. Estimated Blood Loss:     Estimated blood loss: none. Impression:               - Healing gastric ulcer (biopsied)                           - Mildly irregular Z line (biopsied to r/o short                            segment Barrett's esophagus). Recommendation:           - Patient has a contact number available for                            emergencies. The signs and symptoms of potential                            delayed complications were discussed with the                            patient. Return to normal activities tomorrow.                            Written discharge instructions were provided to the                            patient.                           - Resume previous diet.                           - Continue Protonix 40 mg p.o. once a day.                            - Avoid ibuprofen, naproxen, or other non-steroidal                            anti-inflammatory drugs.                           - Await pathology results.                           - Return to GI clinic in 12 weeks. Jackquline Denmark, MD 05/10/2018 3:17:19 PM This report has been signed electronically.

## 2018-05-13 ENCOUNTER — Telehealth: Payer: Self-pay

## 2018-05-13 ENCOUNTER — Ambulatory Visit (HOSPITAL_BASED_OUTPATIENT_CLINIC_OR_DEPARTMENT_OTHER)
Admission: RE | Admit: 2018-05-13 | Discharge: 2018-05-13 | Disposition: A | Discharge status: Home / Self Care | Payer: 59 | Source: Ambulatory Visit | Attending: Gastroenterology | Admitting: Gastroenterology

## 2018-05-13 ENCOUNTER — Telehealth: Payer: Self-pay | Admitting: *Deleted

## 2018-05-13 DIAGNOSIS — R1013 Epigastric pain: Secondary | ICD-10-CM | POA: Diagnosis present

## 2018-05-13 DIAGNOSIS — D509 Iron deficiency anemia, unspecified: Secondary | ICD-10-CM | POA: Diagnosis present

## 2018-05-13 NOTE — Telephone Encounter (Signed)
  Follow up Call-  Call back number 05/10/2018  Post procedure Call Back phone  # [phone] or cell [phone]  Permission to leave phone message No  comments no voicemail  Some recent data might be hidden     Patient questions:  Do you have a fever, pain , or abdominal swelling? No. Pain Score  0 *  Have you tolerated food without any problems? Yes.    Have you been able to return to your normal activities? Yes.    Do you have any questions about your discharge instructions: Diet   No. Medications  No. Follow up visit  No.  Do you have questions or concerns about your Care? No.  Actions: * If pain score is 4 or above: No action needed, pain <4.

## 2018-05-13 NOTE — Telephone Encounter (Signed)
Received Mammogram results from [place]; forwarded to provider/SLS 12/23

## 2018-05-27 ENCOUNTER — Other Ambulatory Visit: Payer: 59

## 2018-05-27 DIAGNOSIS — D509 Iron deficiency anemia, unspecified: Secondary | ICD-10-CM

## 2018-05-27 DIAGNOSIS — R1013 Epigastric pain: Secondary | ICD-10-CM

## 2018-05-27 LAB — HEMOCCULT SLIDES (X 3 CARDS)
Fecal Occult Blood: NEGATIVE
OCCULT 1: NEGATIVE
OCCULT 2: NEGATIVE
OCCULT 3: NEGATIVE
OCCULT 4: NEGATIVE
OCCULT 5: NEGATIVE

## 2018-05-28 ENCOUNTER — Encounter: Payer: Self-pay | Admitting: Gastroenterology

## 2018-08-02 ENCOUNTER — Ambulatory Visit (INDEPENDENT_AMBULATORY_CARE_PROVIDER_SITE_OTHER): Payer: 59 | Admitting: Family Medicine

## 2018-08-02 ENCOUNTER — Other Ambulatory Visit (HOSPITAL_COMMUNITY)
Admission: RE | Admit: 2018-08-02 | Discharge: 2018-08-02 | Disposition: A | Discharge status: Home / Self Care | Payer: 59 | Source: Ambulatory Visit | Attending: Family Medicine | Admitting: Family Medicine

## 2018-08-02 ENCOUNTER — Encounter: Payer: Self-pay | Admitting: Family Medicine

## 2018-08-02 ENCOUNTER — Other Ambulatory Visit: Payer: Self-pay

## 2018-08-02 VITALS — BP 128/76 | HR 82 | Temp 98.2°F | Ht 65.0 in | Wt 184.0 lb

## 2018-08-02 DIAGNOSIS — Z0000 Encounter for general adult medical examination without abnormal findings: Secondary | ICD-10-CM

## 2018-08-02 DIAGNOSIS — K219 Gastro-esophageal reflux disease without esophagitis: Secondary | ICD-10-CM

## 2018-08-02 DIAGNOSIS — Z23 Encounter for immunization: Secondary | ICD-10-CM | POA: Diagnosis not present

## 2018-08-02 DIAGNOSIS — E119 Type 2 diabetes mellitus without complications: Secondary | ICD-10-CM | POA: Diagnosis not present

## 2018-08-02 LAB — COMPREHENSIVE METABOLIC PANEL
ALT: 19 U/L (ref 0–35)
AST: 19 U/L (ref 0–37)
Albumin: 4.5 g/dL (ref 3.5–5.2)
Alkaline Phosphatase: 126 U/L — ABNORMAL HIGH (ref 39–117)
BUN: 18 mg/dL (ref 6–23)
CO2: 28 meq/L (ref 19–32)
Calcium: 9.9 mg/dL (ref 8.4–10.5)
Chloride: 104 mEq/L (ref 96–112)
Creatinine, Ser: 0.94 mg/dL (ref 0.40–1.20)
GFR: 59.86 mL/min — AB (ref >60.00)
Glucose, Bld: 93 mg/dL (ref 70–99)
Potassium: 4.6 mEq/L (ref 3.5–5.1)
Sodium: 142 mEq/L (ref 135–145)
Total Bilirubin: 0.6 mg/dL (ref 0.2–1.2)
Total Protein: 7.1 g/dL (ref 6.0–8.3)

## 2018-08-02 LAB — CBC WITH DIFFERENTIAL/PLATELET
Basophils Absolute: 0 10*3/uL (ref 0.0–0.1)
Basophils Relative: 0.6 % (ref 0.0–3.0)
Eosinophils Absolute: 0.1 10*3/uL (ref 0.0–0.7)
Eosinophils Relative: 1.3 % (ref 0.0–5.0)
HCT: 40.9 % (ref 36.0–46.0)
Hemoglobin: 14.1 g/dL (ref 12.0–15.0)
Lymphocytes Relative: 25.6 % (ref 12.0–46.0)
Lymphs Abs: 1.7 10*3/uL (ref 0.7–4.0)
MCHC: 34.4 g/dL (ref 30.0–36.0)
MCV: 89.9 fl (ref 78.0–100.0)
Monocytes Absolute: 0.4 10*3/uL (ref 0.1–1.0)
Monocytes Relative: 6.3 % (ref 3.0–12.0)
Neutro Abs: 4.3 10*3/uL (ref 1.4–7.7)
Neutrophils Relative %: 66.2 % (ref 43.0–77.0)
Platelets: 296 10*3/uL (ref 150.0–400.0)
RBC: 4.55 Mil/uL (ref 3.87–5.11)
RDW: 13.6 % (ref 11.5–15.5)
WBC: 6.5 10*3/uL (ref 4.0–10.5)

## 2018-08-02 LAB — LIPID PANEL
Cholesterol: 219 mg/dL — ABNORMAL HIGH (ref 0–200)
HDL: 52.9 mg/dL (ref >39.00)
LDL Cholesterol: 141 mg/dL — ABNORMAL HIGH (ref 0–99)
NONHDL: 166.03
Total CHOL/HDL Ratio: 4
Triglycerides: 124 mg/dL (ref 0.0–149.0)
VLDL: 24.8 mg/dL (ref 0.0–40.0)

## 2018-08-02 LAB — TSH: TSH: 1.7 u[IU]/mL (ref 0.35–4.50)

## 2018-08-02 MED ORDER — BLOOD GLUCOSE METER KIT: PACK | 0 refills | Status: DC

## 2018-08-02 MED ORDER — PANTOPRAZOLE SODIUM 40 MG PO TBEC: 40.0000 mg | DELAYED_RELEASE_TABLET | Freq: Every day | ORAL | 3 refills | Status: DC

## 2018-08-02 NOTE — Patient Instructions (Signed)
Preventive Care 40-64 Years, Female Preventive care refers to lifestyle choices and visits with your health care provider that can promote health and wellness. What does preventive care include?   A yearly physical exam. This is also called an annual well check.  Dental exams once or twice a year.  Routine eye exams. Ask your health care provider how often you should have your eyes checked.  Personal lifestyle choices, including: ? Daily care of your teeth and gums. ? Regular physical activity. ? Eating a healthy diet. ? Avoiding tobacco and drug use. ? Limiting alcohol use. ? Practicing safe sex. ? Taking low-dose aspirin daily starting at age 50. ? Taking vitamin and mineral supplements as recommended by your health care provider. What happens during an annual well check? The services and screenings done by your health care provider during your annual well check will depend on your age, overall health, lifestyle risk factors, and family history of disease. Counseling Your health care provider may ask you questions about your:  Alcohol use.  Tobacco use.  Drug use.  Emotional well-being.  Home and relationship well-being.  Sexual activity.  Eating habits.  Work and work environment.  Method of birth control.  Menstrual cycle.  Pregnancy history. Screening You may have the following tests or measurements:  Height, weight, and BMI.  Blood pressure.  Lipid and cholesterol levels. These may be checked every 5 years, or more frequently if you are over 50 years old.  Skin check.  Lung cancer screening. You may have this screening every year starting at age 55 if you have a 30-pack-year history of smoking and currently smoke or have quit within the past 15 years.  Colorectal cancer screening. All adults should have this screening starting at age 50 and continuing until age 75. Your health care provider may recommend screening at age 45. You will have tests every  1-10 years, depending on your results and the type of screening test. People at increased risk should start screening at an earlier age. Screening tests may include: ? Guaiac-based fecal occult blood testing. ? Fecal immunochemical test (FIT). ? Stool DNA test. ? Virtual colonoscopy. ? Sigmoidoscopy. During this test, a flexible tube with a tiny camera (sigmoidoscope) is used to examine your rectum and lower colon. The sigmoidoscope is inserted through your anus into your rectum and lower colon. ? Colonoscopy. During this test, a long, thin, flexible tube with a tiny camera (colonoscope) is used to examine your entire colon and rectum.  Hepatitis C blood test.  Hepatitis B blood test.  Sexually transmitted disease (STD) testing.  Diabetes screening. This is done by checking your blood sugar (glucose) after you have not eaten for a while (fasting). You may have this done every 1-3 years.  Mammogram. This may be done every 1-2 years. Talk to your health care provider about when you should start having regular mammograms. This may depend on whether you have a family history of breast cancer.  BRCA-related cancer screening. This may be done if you have a family history of breast, ovarian, tubal, or peritoneal cancers.  Pelvic exam and Pap test. This may be done every 3 years starting at age 21. Starting at age 30, this may be done every 5 years if you have a Pap test in combination with an HPV test.  Bone density scan. This is done to screen for osteoporosis. You may have this scan if you are at high risk for osteoporosis. Discuss your test results, treatment options,   and if necessary, the need for more tests with your health care provider. Vaccines Your health care provider may recommend certain vaccines, such as:  Influenza vaccine. This is recommended every year.  Tetanus, diphtheria, and acellular pertussis (Tdap, Td) vaccine. You may need a Td booster every 10 years.  Varicella  vaccine. You may need this if you have not been vaccinated.  Zoster vaccine. You may need this after age 38.  Measles, mumps, and rubella (MMR) vaccine. You may need at least one dose of MMR if you were born in 1957 or later. You may also need a second dose.  Pneumococcal 13-valent conjugate (PCV13) vaccine. You may need this if you have certain conditions and were not previously vaccinated.  Pneumococcal polysaccharide (PPSV23) vaccine. You may need one or two doses if you smoke cigarettes or if you have certain conditions.  Meningococcal vaccine. You may need this if you have certain conditions.  Hepatitis A vaccine. You may need this if you have certain conditions or if you travel or work in places where you may be exposed to hepatitis A.  Hepatitis B vaccine. You may need this if you have certain conditions or if you travel or work in places where you may be exposed to hepatitis B.  Haemophilus influenzae type b (Hib) vaccine. You may need this if you have certain conditions. Talk to your health care provider about which screenings and vaccines you need and how often you need them. This information is not intended to replace advice given to you by your health care provider. Make sure you discuss any questions you have with your health care provider. Document Released: 06/04/2015 Document Revised: 06/28/2017 Document Reviewed: 03/09/2015 Elsevier Interactive Patient Education  2019 [employer].

## 2018-08-02 NOTE — Progress Notes (Signed)
Subjective:     Katherine Hanna is a 63 y.o. female and is here for a comprehensive physical exam. The patient reports no problems.  Social History   Socioeconomic History  . Marital status: Married    Spouse name: Not on file  . Number of children: 2  . Years of education: Not on file  . Highest education level: Not on file  Occupational History  . Occupation: Engineer, maintenance (IT)  Social Needs  . Financial resource strain: Not on file  . Food insecurity:    Worry: Not on file    Inability: Not on file  . Transportation needs:    Medical: Not on file    Non-medical: Not on file  Tobacco Use  . Smoking status: Never Smoker  . Smokeless tobacco: Never Used  Substance and Sexual Activity  . Alcohol use: No  . Drug use: No  . Sexual activity: Not on file  Lifestyle  . Physical activity:    Days per week: Not on file    Minutes per session: Not on file  . Stress: Not on file  Relationships  . Social connections:    Talks on phone: Not on file    Gets together: Not on file    Attends religious service: Not on file    Active member of club or organization: Not on file    Attends meetings of clubs or organizations: Not on file    Relationship status: Not on file  . Intimate partner violence:    Fear of current or ex partner: Not on file    Emotionally abused: Not on file    Physically abused: Not on file    Forced sexual activity: Not on file  Other Topics Concern  . Not on file  Social History Narrative  . Not on file   Health Maintenance  Topic Date Due  . PAP SMEAR-Modifier  06/17/2018  . MAMMOGRAM  10/30/2018  . COLONOSCOPY  10/12/2019  . DEXA SCAN  11/01/2019  . TETANUS/TDAP  07/31/2027  . INFLUENZA VACCINE  Completed  . Hepatitis C Screening  Completed  . HIV Screening  Completed    The following portions of the patient's history were reviewed and updated as appropriate: She  has a past medical history of Allergic rhinitis, Arthritis, Gestational diabetes, History of  chicken pox, Migraine, and Rheumatic fever. She does not have any pertinent problems on file. She  has a past surgical history that includes Appendectomy (11/1971); Tonsillectomy; and Colonoscopy. Her family history includes Cancer in her father; Colon cancer in her maternal uncle; Coronary artery disease in her maternal grandfather; Diabetes in her father; Leukemia in her father; Lung cancer in her mother; Stroke in her father, maternal grandmother, and paternal grandmother. She  reports that she has never smoked. She has never used smokeless tobacco. She reports that she does not drink alcohol or use drugs. She has a current medication list which includes the following prescription(s): albuterol, calcium carbonate-vitamin d, co-enzyme q-10, fexofenadine, misc natural products, multivitamin, pantoprazole, and turmeric. Current Outpatient Medications on File Prior to Visit  Medication Sig Dispense Refill  . albuterol (VENTOLIN HFA) 108 (90 BASE) MCG/ACT inhaler Inhale 2 puffs into the lungs every 6 (six) hours as needed. 1 Inhaler 1  . Calcium Carbonate-Vitamin D (CALCIUM 500 + D PO) Take 1 tablet by mouth 2 (two) times daily.    Marland Kitchen co-enzyme Q-10 30 MG capsule Take 30 mg by mouth daily.      . fexofenadine (  ALLEGRA) 180 MG tablet Take 180 mg by mouth daily.    . Misc Natural Products (TART CHERRY ADVANCED PO) Take by mouth.    . Multiple Vitamin (MULTIVITAMIN) tablet Take 1 tablet by mouth daily.      . TURMERIC PO Take by mouth.     No current facility-administered medications on file prior to visit.    She is allergic to penicillins..  Review of Systems Review of Systems  Constitutional: Negative for activity change, appetite change and fatigue.  HENT: Negative for hearing loss, congestion, tinnitus and ear discharge.  dentist q52m Eyes: Negative for visual disturbance (see optho q1y -- vision corrected to 20/20 with glasses).  Respiratory: Negative for cough, chest tightness and shortness  of breath.   Cardiovascular: Negative for chest pain, palpitations and leg swelling.  Gastrointestinal: Negative for abdominal pain, diarrhea, constipation and abdominal distention.  Genitourinary: Negative for urgency, frequency, decreased urine volume and difficulty urinating.  Musculoskeletal: Negative for back pain, arthralgias and gait problem.  Skin: Negative for color change, pallor and rash.  Neurological: Negative for dizziness, light-headedness, numbness and headaches.  Hematological: Negative for adenopathy. Does not bruise/bleed easily.  Psychiatric/Behavioral: Negative for suicidal ideas, confusion, sleep disturbance, self-injury, dysphoric mood, decreased concentration and agitation.       Objective:    BP 128/76 (BP Location: Right Arm, Patient Position: Sitting, Cuff Size: Normal)   Pulse 82   Temp 98.2 F (36.8 C)   Ht 5\' 5"  (1.651 m)   Wt 184 lb (83.5 kg)   SpO2 97%   BMI 30.62 kg/m  General appearance: alert, cooperative, appears stated age and no distress Head: Normocephalic, without obvious abnormality, atraumatic Eyes: conjunctivae/corneas clear. PERRL, EOM's intact. Fundi benign. Ears: normal TM's and external ear canals both ears Nose: Nares normal. Septum midline. Mucosa normal. No drainage or sinus tenderness. Throat: lips, mucosa, and tongue normal; teeth and gums normal Neck: no adenopathy, no carotid bruit, no JVD, supple, symmetrical, trachea midline and thyroid not enlarged, symmetric, no tenderness/mass/nodules Back: symmetric, no curvature. ROM normal. No CVA tenderness. Lungs: clear to auscultation bilaterally Breasts: normal appearance, no masses or tenderness Heart: regular rate and rhythm, S1, S2 normal, no murmur, click, rub or gallop Abdomen: soft, non-tender; bowel sounds normal; no masses,  no organomegaly Pelvic: cervix normal in appearance, external genitalia normal, no adnexal masses or tenderness, no cervical motion tenderness,  rectovaginal septum normal, uterus normal size, shape, and consistency, vagina normal without discharge and pap done,  rectal heme neg brown stool Extremities: extremities normal, atraumatic, no cyanosis or edema Pulses: 2+ and symmetric Skin: Skin color, texture, turgor normal. No rashes or lesions Lymph nodes: Cervical, supraclavicular, and axillary nodes normal. Neurologic: Alert and oriented X 3, normal strength and tone. Normal symmetric reflexes. Normal coordination and gait    Assessment:    Healthy female exam.      Plan:    ghm utd Check labs  See After Visit Summary for Counseling Recommendations    1. Preventative health care See above - TSH - Lipid panel - CBC with Differential/Platelet - Comprehensive metabolic panel  2. Gastroesophageal reflux disease, esophagitis presence not specified stable - pantoprazole (PROTONIX) 40 MG tablet; Take 1 tablet (40 mg total) by mouth daily.  Dispense: 90 tablet; Refill: 3

## 2018-08-06 ENCOUNTER — Other Ambulatory Visit: Payer: Self-pay | Admitting: Family Medicine

## 2018-08-06 ENCOUNTER — Encounter: Payer: Self-pay | Admitting: *Deleted

## 2018-08-06 ENCOUNTER — Other Ambulatory Visit: Payer: Self-pay | Admitting: *Deleted

## 2018-08-06 DIAGNOSIS — E785 Hyperlipidemia, unspecified: Secondary | ICD-10-CM

## 2018-08-06 LAB — CYTOLOGY - PAP
DIAGNOSIS: NEGATIVE
HPV: NOT DETECTED

## 2018-11-04 LAB — HM MAMMOGRAPHY

## 2019-07-21 ENCOUNTER — Encounter: Payer: Self-pay | Admitting: Gastroenterology

## 2019-08-04 ENCOUNTER — Other Ambulatory Visit: Payer: Self-pay

## 2019-08-04 ENCOUNTER — Ambulatory Visit (INDEPENDENT_AMBULATORY_CARE_PROVIDER_SITE_OTHER): Payer: 59 | Admitting: Family Medicine

## 2019-08-04 ENCOUNTER — Encounter: Payer: Self-pay | Admitting: Family Medicine

## 2019-08-04 VITALS — BP 130/78 | HR 88 | Temp 97.3°F | Resp 18 | Ht 65.0 in | Wt 181.8 lb

## 2019-08-04 DIAGNOSIS — K219 Gastro-esophageal reflux disease without esophagitis: Secondary | ICD-10-CM | POA: Diagnosis not present

## 2019-08-04 DIAGNOSIS — Z0000 Encounter for general adult medical examination without abnormal findings: Secondary | ICD-10-CM

## 2019-08-04 LAB — CBC WITH DIFFERENTIAL/PLATELET
Basophils Absolute: 0 10*3/uL (ref 0.0–0.1)
Basophils Relative: 0.5 % (ref 0.0–3.0)
Eosinophils Absolute: 0.1 10*3/uL (ref 0.0–0.7)
Eosinophils Relative: 1.4 % (ref 0.0–5.0)
HCT: 41.9 % (ref 36.0–46.0)
Hemoglobin: 14.3 g/dL (ref 12.0–15.0)
Lymphocytes Relative: 28.6 % (ref 12.0–46.0)
Lymphs Abs: 1.9 10*3/uL (ref 0.7–4.0)
MCHC: 34 g/dL (ref 30.0–36.0)
MCV: 93.3 fl (ref 78.0–100.0)
Monocytes Absolute: 0.4 10*3/uL (ref 0.1–1.0)
Monocytes Relative: 5.5 % (ref 3.0–12.0)
Neutro Abs: 4.4 10*3/uL (ref 1.4–7.7)
Neutrophils Relative %: 64 % (ref 43.0–77.0)
Platelets: 307 10*3/uL (ref 150.0–400.0)
RBC: 4.5 Mil/uL (ref 3.87–5.11)
RDW: 13.3 % (ref 11.5–15.5)
WBC: 6.8 10*3/uL (ref 4.0–10.5)

## 2019-08-04 LAB — TSH: TSH: 1.35 u[IU]/mL (ref 0.35–4.50)

## 2019-08-04 LAB — LIPID PANEL
Cholesterol: 208 mg/dL — ABNORMAL HIGH (ref 0–200)
HDL: 46.8 mg/dL (ref >39.00)
NonHDL: 160.87
Total CHOL/HDL Ratio: 4
Triglycerides: 213 mg/dL — ABNORMAL HIGH (ref 0.0–149.0)
VLDL: 42.6 mg/dL — ABNORMAL HIGH (ref 0.0–40.0)

## 2019-08-04 LAB — COMPREHENSIVE METABOLIC PANEL
ALT: 25 U/L (ref 0–35)
AST: 21 U/L (ref 0–37)
Albumin: 4.5 g/dL (ref 3.5–5.2)
Alkaline Phosphatase: 129 U/L — ABNORMAL HIGH (ref 39–117)
BUN: 16 mg/dL (ref 6–23)
CO2: 28 mEq/L (ref 19–32)
Calcium: 9.9 mg/dL (ref 8.4–10.5)
Chloride: 103 mEq/L (ref 96–112)
Creatinine, Ser: 0.84 mg/dL (ref 0.40–1.20)
GFR: 67.95 mL/min (ref >60.00)
Glucose, Bld: 95 mg/dL (ref 70–99)
Potassium: 4.5 mEq/L (ref 3.5–5.1)
Sodium: 141 mEq/L (ref 135–145)
Total Bilirubin: 0.6 mg/dL (ref 0.2–1.2)
Total Protein: 7.4 g/dL (ref 6.0–8.3)

## 2019-08-04 LAB — LDL CHOLESTEROL, DIRECT: Direct LDL: 129 mg/dL

## 2019-08-04 MED ORDER — PANTOPRAZOLE SODIUM 40 MG PO TBEC: 40.0000 mg | DELAYED_RELEASE_TABLET | Freq: Every day | ORAL | 3 refills | Status: DC

## 2019-08-04 NOTE — Patient Instructions (Signed)
Preventive Care 66 Years and Older, Female Preventive care refers to lifestyle choices and visits with your health care provider that can promote health and wellness. This includes:  A yearly physical exam. This is also called an annual well check.  Regular dental and eye exams.  Immunizations.  Screening for certain conditions.  Healthy lifestyle choices, such as diet and exercise. What can I expect for my preventive care visit? Physical exam Your health care provider will check:  Height and weight. These may be used to calculate body mass index (BMI), which is a measurement that tells if you are at a healthy weight.  Heart rate and blood pressure.  Your skin for abnormal spots. Counseling Your health care provider may ask you questions about:  Alcohol, tobacco, and drug use.  Emotional well-being.  Home and relationship well-being.  Sexual activity.  Eating habits.  History of falls.  Memory and ability to understand (cognition).  Work and work Statistician.  Pregnancy and menstrual history. What immunizations do I need?  Influenza (flu) vaccine  This is recommended every year. Tetanus, diphtheria, and pertussis (Tdap) vaccine  You may need a Td booster every 10 years. Varicella (chickenpox) vaccine  You may need this vaccine if you have not already been vaccinated. Zoster (shingles) vaccine  You may need this after age 33. Pneumococcal conjugate (PCV13) vaccine  One dose is recommended after age 33. Pneumococcal polysaccharide (PPSV23) vaccine  One dose is recommended after age 72. Measles, mumps, and rubella (MMR) vaccine  You may need at least one dose of MMR if you were born in 1957 or later. You may also need a second dose. Meningococcal conjugate (MenACWY) vaccine  You may need this if you have certain conditions. Hepatitis A vaccine  You may need this if you have certain conditions or if you travel or work in places where you may be exposed  to hepatitis A. Hepatitis B vaccine  You may need this if you have certain conditions or if you travel or work in places where you may be exposed to hepatitis B. Haemophilus influenzae type b (Hib) vaccine  You may need this if you have certain conditions. You may receive vaccines as individual doses or as more than one vaccine together in one shot (combination vaccines). Talk with your health care provider about the risks and benefits of combination vaccines. What tests do I need? Blood tests  Lipid and cholesterol levels. These may be checked every 5 years, or more frequently depending on your overall health.  Hepatitis C test.  Hepatitis B test. Screening  Lung cancer screening. You may have this screening every year starting at age 39 if you have a 30-pack-year history of smoking and currently smoke or have quit within the past 15 years.  Colorectal cancer screening. All adults should have this screening starting at age 36 and continuing until age 15. Your health care provider may recommend screening at age 23 if you are at increased risk. You will have tests every 1-10 years, depending on your results and the type of screening test.  Diabetes screening. This is done by checking your blood sugar (glucose) after you have not eaten for a while (fasting). You may have this done every 1-3 years.  Mammogram. This may be done every 1-2 years. Talk with your health care provider about how often you should have regular mammograms.  BRCA-related cancer screening. This may be done if you have a family history of breast, ovarian, tubal, or peritoneal cancers.  Other tests  Sexually transmitted disease (STD) testing.  Bone density scan. This is done to screen for osteoporosis. You may have this done starting at age 44. Follow these instructions at home: Eating and drinking  Eat a diet that includes fresh fruits and vegetables, whole grains, lean protein, and low-fat dairy products. Limit  your intake of foods with high amounts of sugar, saturated fats, and salt.  Take vitamin and mineral supplements as recommended by your health care provider.  Do not drink alcohol if your health care provider tells you not to drink.  If you drink alcohol: ? Limit how much you have to 0-1 drink a day. ? Be aware of how much alcohol is in your drink. In the [place] one drink equals one 12 oz bottle of beer (355 mL), one 5 oz glass of wine (148 mL), or one 1 oz glass of hard liquor (44 mL). Lifestyle  Take daily care of your teeth and gums.  Stay active. Exercise for at least 30 minutes on 5 or more days each week.  Do not use any products that contain nicotine or tobacco, such as cigarettes, e-cigarettes, and chewing tobacco. If you need help quitting, ask your health care provider.  If you are sexually active, practice safe sex. Use a condom or other form of protection in order to prevent STIs (sexually transmitted infections).  Talk with your health care provider about taking a low-dose aspirin or statin. What's next?  Go to your health care provider once a year for a well check visit.  Ask your health care provider how often you should have your eyes and teeth checked.  Stay up to date on all vaccines. This information is not intended to replace advice given to you by your health care provider. Make sure you discuss any questions you have with your health care provider. Document Revised: 05/02/2018 Document Reviewed: 05/02/2018 Elsevier Patient Education  2020 [employer].

## 2019-08-04 NOTE — Progress Notes (Signed)
Subjective:     Katherine Hanna is a 64 y.o. female and is here for a comprehensive physical exam. The patient reports problems - + stress,  at work---  she is retiring at the end of the year .  Social History   Socioeconomic History  . Marital status: Married    Spouse name: Not on file  . Number of children: 2  . Years of education: Not on file  . Highest education level: Not on file  Occupational History  . Occupation: Engineer, maintenance (IT)  Tobacco Use  . Smoking status: Never Smoker  . Smokeless tobacco: Never Used  Substance and Sexual Activity  . Alcohol use: No  . Drug use: No  . Sexual activity: Not on file  Other Topics Concern  . Not on file  Social History Narrative  . Not on file   Social Determinants of Health   Financial Resource Strain:   . Difficulty of Paying Living Expenses:   Food Insecurity:   . Worried About Charity fundraiser in the Last Year:   . Arboriculturist in the Last Year:   Transportation Needs:   . Film/video editor (Medical):   Marland Kitchen Lack of Transportation (Non-Medical):   Physical Activity:   . Days of Exercise per Week:   . Minutes of Exercise per Session:   Stress:   . Feeling of Stress :   Social Connections:   . Frequency of Communication with Friends and Family:   . Frequency of Social Gatherings with Friends and Family:   . Attends Religious Services:   . Active Member of Clubs or Organizations:   . Attends Archivist Meetings:   Marland Kitchen Marital Status:   Intimate Partner Violence:   . Fear of Current or Ex-Partner:   . Emotionally Abused:   Marland Kitchen Physically Abused:   . Sexually Abused:    Health Maintenance  Topic Date Due  . URINE MICROALBUMIN  Never done  . PNA vac Low Risk Adult (1 of 2 - PCV13) 02/16/2019  . COLONOSCOPY  10/12/2019  . DEXA SCAN  11/01/2019  . MAMMOGRAM  11/04/2019  . PAP SMEAR-Modifier  08/01/2021  . TETANUS/TDAP  07/31/2027  . INFLUENZA VACCINE  Completed  . Hepatitis C Screening  Completed  . HIV Screening   Completed    The following portions of the patient's history were reviewed and updated as appropriate:  She  has a past medical history of Allergic rhinitis, Arthritis, Gestational diabetes, History of chicken pox, Migraine, and Rheumatic fever. She does not have any pertinent problems on file. She  has a past surgical history that includes Appendectomy (11/1971); Tonsillectomy; and Colonoscopy. Her family history includes Cancer in her father; Colon cancer in her maternal uncle; Coronary artery disease in her maternal grandfather; Diabetes in her father; Leukemia in her father; Lung cancer in her mother; Stroke in her father, maternal grandmother, and paternal grandmother. She  reports that she has never smoked. She has never used smokeless tobacco. She reports that she does not drink alcohol or use drugs. She has a current medication list which includes the following prescription(s): albuterol, calcium carbonate-vitamin d, co-enzyme q-10, fexofenadine, misc natural products, multivitamin, pantoprazole, turmeric, and blood glucose meter kit and supplies. Current Outpatient Medications on File Prior to Visit  Medication Sig Dispense Refill  . albuterol (VENTOLIN HFA) 108 (90 BASE) MCG/ACT inhaler Inhale 2 puffs into the lungs every 6 (six) hours as needed. 1 Inhaler 1  . Calcium Carbonate-Vitamin  D (CALCIUM 500 + D PO) Take 1 tablet by mouth 2 (two) times daily.    Marland Kitchen co-enzyme Q-10 30 MG capsule Take 30 mg by mouth daily.      . fexofenadine (ALLEGRA) 180 MG tablet Take 180 mg by mouth daily.    . Misc Natural Products (TART CHERRY ADVANCED PO) Take by mouth.    . Multiple Vitamin (MULTIVITAMIN) tablet Take 1 tablet by mouth daily.      . TURMERIC PO Take by mouth.    . blood glucose meter kit and supplies Check glucose once daily. (Patient not taking: Reported on 08/04/2019) 1 each 0   No current facility-administered medications on file prior to visit.   She is allergic to  penicillins..  Review of Systems Review of Systems  Constitutional: Negative for activity change, appetite change and fatigue.  HENT: Negative for hearing loss, congestion, tinnitus and ear discharge.  dentist q11mEyes: Negative for visual disturbance (see optho q1y -- vision corrected to 20/20 with glasses).  Respiratory: Negative for cough, chest tightness and shortness of breath.   Cardiovascular: Negative for chest pain, palpitations and leg swelling.  Gastrointestinal: Negative for abdominal pain, diarrhea, constipation and abdominal distention.  Genitourinary: Negative for urgency, frequency, decreased urine volume and difficulty urinating.  Musculoskeletal: Negative for back pain, arthralgias and gait problem.  Skin: Negative for color change, pallor and rash.  Neurological: Negative for dizziness, light-headedness, numbness and headaches.  Hematological: Negative for adenopathy. Does not bruise/bleed easily.  Psychiatric/Behavioral: Negative for suicidal ideas, confusion, sleep disturbance, self-injury, dysphoric mood, decreased concentration and agitation.       Objective:    BP 130/78 (BP Location: Right Arm, Patient Position: Sitting, Cuff Size: Normal)   Pulse 88   Temp (!) 97.3 F (36.3 C) (Temporal)   Resp 18   Ht 5' 5" (1.651 m)   Wt 181 lb 12.8 oz (82.5 kg)   SpO2 97%   BMI 30.25 kg/m  General appearance: alert, cooperative, appears stated age and no distress Head: Normocephalic, without obvious abnormality, atraumatic Eyes: conjunctivae/corneas clear. PERRL, EOM's intact. Fundi benign. Ears: normal TM's and external ear canals both ears Neck: no adenopathy, no carotid bruit, no JVD, supple, symmetrical, trachea midline and thyroid not enlarged, symmetric, no tenderness/mass/nodules Back: symmetric, no curvature. ROM normal. No CVA tenderness. Lungs: clear to auscultation bilaterally Breasts: normal appearance, no masses or tenderness Heart: regular rate and  rhythm, S1, S2 normal, no murmur, click, rub or gallop Abdomen: soft, non-tender; bowel sounds normal; no masses,  no organomegaly Pelvic: not indicated; post-menopausal, no abnormal Pap smears in past Extremities: extremities normal, atraumatic, no cyanosis or edema Pulses: 2+ and symmetric Skin: Skin color, texture, turgor normal. No rashes or lesions Lymph nodes: Cervical, supraclavicular, and axillary nodes normal. Neurologic: Alert and oriented X 3, normal strength and tone. Normal symmetric reflexes. Normal coordination and gait    Assessment:    Healthy female exam.      Plan:    ghm utd Check labs  See After Visit Summary for Counseling Recommendations    1. Gastroesophageal reflux disease Stable Refill meds  - pantoprazole (PROTONIX) 40 MG tablet; Take 1 tablet (40 mg total) by mouth daily.  Dispense: 90 tablet; Refill: 3  2. Preventative health care See above - TSH - Lipid panel - CBC with Differential/Platelet - Comprehensive metabolic panel

## 2019-09-22 ENCOUNTER — Encounter: Payer: Self-pay | Admitting: Family Medicine

## 2019-10-01 ENCOUNTER — Encounter: Payer: Self-pay | Admitting: Family Medicine

## 2019-10-01 NOTE — Telephone Encounter (Signed)
Pt sent message last week about starting depression medication that was talked about at last visit. Pt didn't recall name of medication but was wanting to start new medication. Please advise

## 2019-10-02 MED ORDER — SERTRALINE HCL 50 MG PO TABS: 50.0000 mg | ORAL_TABLET | Freq: Every day | ORAL | 3 refills | Status: DC

## 2019-10-02 NOTE — Telephone Encounter (Signed)
Start zoloft 50 mg #30  1 po qd 2 refills  Tell her to start with 1/2 tabl for 8 days then go to 1 a day F/u in 1 month-- virtual is ok

## 2019-11-10 LAB — HM MAMMOGRAPHY: HM Mammogram: ABNORMAL — AB (ref 0–4)

## 2019-11-18 ENCOUNTER — Encounter: Payer: Self-pay | Admitting: Family Medicine

## 2019-11-18 NOTE — Telephone Encounter (Signed)
She needs f/u after 3rd month

## 2019-12-03 ENCOUNTER — Encounter: Payer: Self-pay | Admitting: Family Medicine

## 2019-12-03 ENCOUNTER — Other Ambulatory Visit: Payer: Self-pay | Admitting: Radiology

## 2019-12-04 ENCOUNTER — Encounter: Payer: Self-pay | Admitting: *Deleted

## 2019-12-05 ENCOUNTER — Telehealth: Payer: Self-pay | Admitting: Hematology

## 2019-12-05 NOTE — Telephone Encounter (Signed)
Spoke to patient to confirm afternoon [facility] appointment for 7/21, packet emailed to patient

## 2019-12-08 ENCOUNTER — Other Ambulatory Visit: Payer: Self-pay | Admitting: *Deleted

## 2019-12-08 DIAGNOSIS — C50211 Malignant neoplasm of upper-inner quadrant of right female breast: Secondary | ICD-10-CM | POA: Insufficient documentation

## 2019-12-08 DIAGNOSIS — Z170 Estrogen receptor positive status [ER+]: Secondary | ICD-10-CM

## 2019-12-08 NOTE — Progress Notes (Addendum)
[place]   [phone] Fax:(336) [place] Note   Patient Care Team: Carollee Herter, Alferd Apa, DO as PCP - General (Family Medicine) Sable Feil, MD as Consulting Physician (Gastroenterology) Hollar, Katharine Look, MD as Referring Physician (Dermatology) Lynnell Dike, [place] as Consulting Physician (Optometry) Mauro Kaufmann, RN as Oncology Nurse Navigator Rockwell Germany, RN as Oncology Nurse Navigator Stark Klein, MD as Consulting Physician (General Surgery) Truitt Merle, MD as Consulting Physician (Hematology) Gery Pray, MD as Consulting Physician (Radiation Oncology)  Date of Service:  12/10/2019   CHIEF COMPLAINTS/PURPOSE OF CONSULTATION:  Newly diagnosed Malignant neoplasm of upper-inner quadrant of right breast    Oncology History Overview Note  Cancer Staging Malignant neoplasm of upper-inner quadrant of right breast in female, estrogen receptor positive ([place]) Staging form: Breast, AJCC 8th Edition - Clinical stage from 12/03/2019: Stage IA (cT1c, cN0, cM0, G2, ER+, PR+, HER2-) - Signed by Truitt Merle, MD on 12/09/2019    Malignant neoplasm of upper-inner quadrant of right breast in female, estrogen receptor positive ([place])  11/25/2019 Mammogram   Mammogram and [place] 11/25/19  IMPRESSION The 1.1x0.8x0.9cm architectural distortion in the right breast at 3:00 position middle depth 4 cm from nipple is highly suggestive of malignancy. An US guided biopsy is recommended.     12/03/2019 Initial Biopsy   Diagnosis 12/03/19 Breast, right, needle core biopsy, 3 o'clock, 4 cmfn - INVASIVE DUCTAL CARCINOMA. - DUCTAL CARCINOMA IN SITU. Microscopic Comment The carcinoma appears grade 2. The greatest linear extent of tumor in any one core is 10 mm. Ancillary studies will be reported separately. Results reported to [facility] on 12/04/2019. Intradepartmental consultation (Dr. Melina Copa).   12/03/2019 Receptors her2    PROGNOSTIC INDICATORS Results: IMMUNOHISTOCHEMICAL AND MORPHOMETRIC ANALYSIS PERFORMED MANUALLY The tumor cells are EQUIVOCAL for Her2 (2+). Her2 by FISH will be performed and results reported separately. Estrogen Receptor: 95%, POSITIVE, STRONG STAINING INTENSITY Progesterone Receptor: 80%, POSITIVE, STRONG STAINING INTENSITY Proliferation Marker Ki67: 10%   FLUORESCENCE IN-SITU HYBRIDIZATION Results: GROUP 5: HER2 **NEGATIVE** Equivocal form of amplification of the HER2 gene was detected in the IHC 2+ tissue sample received from this individual. HER2 FISH was performed by a technologist and cell imaging and analysis on the Bio[facility].   12/03/2019 Cancer Staging   Staging form: Breast, AJCC 8th Edition - Clinical stage from 12/03/2019: Stage IA (cT1c, cN0, cM0, G2, ER+, PR+, HER2-) - Signed by Truitt Merle, MD on 12/09/2019   12/08/2019 Initial Diagnosis   Malignant neoplasm of upper-inner quadrant of right breast in female, estrogen receptor positive ([place])      HISTORY OF PRESENTING ILLNESS:  Katherine Hanna 64 y.o. female is a here because of newly diagnosed right breast cancer. The patient presents to the Breast clinic today accompanied by her husband.   Her mass was found by screening mammogram. She did not feel mass herself. She notes she has been getting yearly mammograms. She has had benign cyst and calcium deposits in the past, but first biopsy. She denies change in breast, nipple, weight, or new pain.   Socially she is married with 2 children. She does not drink or smoke. 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  She has a PMHx of arthritis in thumbs and right knee. She is seeing her PCP for this. She has asthma due to allergy reaction. She has chronic migraines. She is s/p appendectomy and tonsillectomy. She notes her  menopause had manageable hot flashes. I reviewed her medication list. She was on Zoloft for the past few months which helped her anxiety. She notes her  mother had lung cancer from smoking and her father had leukemia. She had a paternal aunt with breast cancer.   GYN HISTORY  Menarchal: 9 LMP: 2001 Contraceptive: 1974-1980 HRT: No G2P2, first at age 37, she breast fed.     REVIEW OF SYSTEMS:    Constitutional: Denies fevers, chills or abnormal night sweats Eyes: Denies blurriness of vision, double vision or watery eyes Ears, nose, mouth, throat, and face: Denies mucositis or sore throat Respiratory: Denies cough, dyspnea or wheezes Cardiovascular: Denies palpitation, chest discomfort or lower extremity swelling Gastrointestinal:  Denies nausea, heartburn or change in bowel habits Skin: Denies abnormal skin rashes MSK: (+) Arthritis mainly in thumbs and right knee  Lymphatics: Denies new lymphadenopathy or easy bruising Neurological:Denies numbness, tingling or new weaknesses Behavioral/Psych: Mood is stable, no new changes  All other systems were reviewed with the patient and are negative.  MEDICAL HISTORY:  Past Medical History:  Diagnosis Date  . Allergic rhinitis   . Arthritis    knee  . Asthma   . Breast cancer ([place])   . History of chicken pox   . Migraine   . Rheumatic fever    as a child.    SURGICAL HISTORY: Past Surgical History:  Procedure Laterality Date  . APPENDECTOMY  11/1971  . COLONOSCOPY    . TONSILLECTOMY     when she was 2 or 3    SOCIAL HISTORY: Social History   Socioeconomic History  . Marital status: Married    Spouse name: Not on file  . Number of children: 2  . Years of education: Not on file  . Highest education level: Not on file  Occupational History  . Occupation: Engineer, maintenance (IT)  Tobacco Use  . Smoking status: Never Smoker  . Smokeless tobacco: Never Used  Vaping Use  . Vaping Use: Never used  Substance and Sexual Activity  . Alcohol use: No  . Drug use: No  . Sexual activity: Not on file  Other Topics Concern  . Not on file  Social History Narrative  . Not on file   Social  Determinants of Health   Financial Resource Strain:   . Difficulty of Paying Living Expenses:   Food Insecurity:   . Worried About Charity fundraiser in the Last Year:   . Arboriculturist in the Last Year:   Transportation Needs:   . Film/video editor (Medical):   Marland Kitchen Lack of Transportation (Non-Medical):   Physical Activity:   . Days of Exercise per Week:   . Minutes of Exercise per Session:   Stress:   . Feeling of Stress :   Social Connections:   . Frequency of Communication with Friends and Family:   . Frequency of Social Gatherings with Friends and Family:   . Attends Religious Services:   . Active Member of Clubs or Organizations:   . Attends Archivist Meetings:   Marland Kitchen Marital Status:   Intimate Partner Violence:   . Fear of Current or Ex-Partner:   . Emotionally Abused:   Marland Kitchen Physically Abused:   . Sexually Abused:     FAMILY HISTORY: Family History  Problem Relation Age of Onset  . Coronary artery disease Maternal Grandfather   . Diabetes Father   . Stroke Father   . Cancer Father  bladder  . Leukemia Father   . Stroke Maternal Grandmother        49s  . Stroke Paternal Grandmother   . Lung cancer Mother   . Colon cancer Maternal Uncle   . Breast cancer Paternal Aunt   . Esophageal cancer Neg Hx     ALLERGIES:  is allergic to penicillins.  MEDICATIONS:  Current Outpatient Medications  Medication Sig Dispense Refill  . pantoprazole (PROTONIX) 40 MG tablet Take 1 tablet (40 mg total) by mouth daily. 90 tablet 3  . sertraline (ZOLOFT) 50 MG tablet Take 1 tablet (50 mg total) by mouth daily. 30 tablet 3  . albuterol (VENTOLIN HFA) 108 (90 BASE) MCG/ACT inhaler Inhale 2 puffs into the lungs every 6 (six) hours as needed. 1 Inhaler 1  . blood glucose meter kit and supplies Check glucose once daily. (Patient not taking: Reported on 08/04/2019) 1 each 0  . Calcium Carbonate-Vitamin D (CALCIUM 500 + D PO) Take 1 tablet by mouth 2 (two) times daily.     Marland Kitchen co-enzyme Q-10 30 MG capsule Take 30 mg by mouth daily.      . fexofenadine (ALLEGRA) 180 MG tablet Take 180 mg by mouth daily.    . Misc Natural Products (TART CHERRY ADVANCED PO) Take by mouth.    . Multiple Vitamin (MULTIVITAMIN) tablet Take 1 tablet by mouth daily.      . TURMERIC PO Take by mouth.     No current facility-administered medications for this visit.    PHYSICAL EXAMINATION: ECOG PERFORMANCE STATUS: 0 - Asymptomatic  Vitals:   12/10/19 1304  BP: 132/86  Pulse: 81  Resp: 18  Temp: 98.3 F (36.8 C)  SpO2: 100%   Filed Weights   12/10/19 1304  Weight: 177 lb 9.6 oz (80.6 kg)    GENERAL:alert, no distress and comfortable SKIN: skin color, texture, turgor are normal, no rashes or significant lesions EYES: normal, Conjunctiva are pink and non-injected, sclera clear  NECK: supple, thyroid normal size, non-tender, without nodularity LYMPH:  no palpable lymphadenopathy in the cervical, axillary  LUNGS: clear to auscultation and percussion with normal breathing effort HEART: regular rate & rhythm and no murmurs and no lower extremity edema ABDOMEN:abdomen soft, non-tender and normal bowel sounds Musculoskeletal:no cyanosis of digits and no clubbing  NEURO: alert & oriented x 3 with fluent speech, no focal motor/sensory deficits BREAST: (+) Lumpy left breast tissue with prominent lump 2x1cm at 10:00 position. (+) Skin ecchymosis at right breast biopsy site. No palpable mass, nodules or adenopathy bilaterally. Breast exam benign.  LABORATORY DATA:  I have reviewed the data as listed CBC Latest Ref Rng & Units 12/10/2019 08/04/2019 08/02/2018  WBC 4.0 - 10.5 K/uL 8.2 6.8 6.5  Hemoglobin 12.0 - 15.0 g/dL 14.1 14.3 14.1  Hematocrit 36 - 46 % 43.0 41.9 40.9  Platelets 150 - 400 K/uL 303 307.0 296.0    CMP Latest Ref Rng & Units 12/10/2019 08/04/2019 08/02/2018  Glucose 70 - 99 mg/dL 109(H) 95 93  BUN 8 - 23 mg/dL '17 16 18  '$ Creatinine 0.44 - 1.00 mg/dL 0.92 0.84 0.94    Sodium 135 - 145 mmol/L 140 141 142  Potassium 3.5 - 5.1 mmol/L 4.5 4.5 4.6  Chloride 98 - 111 mmol/L 105 103 104  CO2 22 - 32 mmol/L '24 28 28  '$ Calcium 8.9 - 10.3 mg/dL 10.7(H) 9.9 9.9  Total Protein 6.5 - 8.1 g/dL 8.1 7.4 7.1  Total Bilirubin 0.3 - 1.2 mg/dL 0.5 0.6  0.6  Alkaline Phos 38 - 126 U/L 140(H) 129(H) 126(H)  AST 15 - 41 U/L '22 21 19  '$ ALT 0 - 44 U/L '22 25 19     '$ RADIOGRAPHIC STUDIES: I have personally reviewed the radiological images as listed and agreed with the findings in the report. No results found.  ASSESSMENT & PLAN:  Katherine Hanna is a 64 y.o. Caucasian female with a history of Arthritis.   1. Malignant neoplasm of upper-inner quadrant of right breast, Stage 1A, c(T1cN0M0), ER+/PR+/HER2-, Grade II  -We discussed her image findings and the biopsy results in great details. She has a 1.1cm Invasive ductal carcinoma with DCIS components.  -Given the early disease, she likely need a lumpectomy with sentinel LN biopsy. She is agreeable with that. She was seen by Dr. Barry Dienes today and likely will proceed with surgery soon.  -I recommend a Oncotype Dx test on the surgical sample and we'll make a decision about adjuvant chemotherapy based on the Oncotype result. Written material of this test was given to her. She is young and fit, would be a good candidate for chemotherapy if her Oncotype recurrence score is high. -If her surgical sentinel lymph node positive, I recommend mammaprint for further risk stratification and guide adjuvant chemotherapy. -Given initial Equivocal HER2 on biopsy sample, will request repeating HER2 on her surgical sample. If HER2 found to be positive will not proceed with oncotype and I will recommend adjuvant chemotherapy and HER2 antibody.  -She was also seen by radiation oncologist Dr. Earney Hamburg today. Adjuvant radiation is recommended to reduce the risk for local recurrence.  -Given the strong ER and PR expression in her postmenopausal status, I  recommend adjuvant endocrine therapy with aromatase inhibitor with Anastrozole, Letrozole or Exemestane for a total of 5-10 years to reduce the risk of cancer recurrence. Potential benefits and side effects were discussed with patient and she is interested. Plan to start after she completes radiation.  -We also discussed the breast cancer surveillance after her surgery. She will continue annual screening mammogram, self exam, and a routine office visit with lab and exam with [place]. -I encouraged her to have healthy diet and exercise regularly -Labs reviewed, CBC and CMP WNL except BG 109, Ca 10.7, Alk Phos 140. I encouraged her to reduce her oral calcium supplement to every other day.  -F/u after surgery or Radiation   2. Anxiety, Arthritis  -She has started Zoloft in the past few month which has helped (before cancer diagnosis).  -She mainly has arthritis in her thumbs and right knee.  -Will monitor on AI.    3. Genetic Testing -She does not have a strong family history of cancer. If she is interested in genetic testing I can refer her.   4. Elevated Alk Phos  -Her Alk phos has been mildly elevated an trending up since 03/2018 labs based on Epic records.  -I briefly discussed possible etiology such as bone disease. If this continues to increase will recommend bone biopsy and further work up.     PLAN:  -she will proceed with lumpectomy and SLN biopsy soon -will repeat HER2 on surgical sample, if negative,will obtain Oncotype on her surgical sample  -F/u after Radiation or sooner if needed    No orders of the defined types were placed in this encounter.   All questions were answered. The patient knows to call the clinic with any problems, questions or concerns. The total time spent in the appointment was 40 minutes.  Truitt Merle, MD 12/10/2019   I, Joslyn Devon, am acting as scribe for Truitt Merle, MD.   I have reviewed the above documentation for accuracy and completeness, and I  agree with the above.

## 2019-12-09 NOTE — Progress Notes (Signed)
Radiation Oncology         [phone] ________________________________  Multidisciplinary Breast Oncology Clinic [facility]) Initial Outpatient Consultation  Name: Katherine Hanna MRN: [id number]  Date: 12/10/2019  DOB: [birth year]  AB:XHCXM Chase, [name], DO  Almond Lint, MD   REFERRING PHYSICIAN: Almond Lint, MD  DIAGNOSIS: The encounter diagnosis was Malignant neoplasm of upper-inner quadrant of right breast in female, estrogen receptor positive ([facility]).  Stage T1c, Nx, Mx, Right Breast UIQ, Invasive Ductal Carcinoma with DCIS, ER+ / PR+ / Her2-, Grade 2    ICD-10-CM   1. Malignant neoplasm of upper-inner quadrant of right breast in female, estrogen receptor positive ([facility])  C50.211    Z17.0     HISTORY OF PRESENT ILLNESS::Katherine Hanna is a 64 y.o. female who is presenting to the office today for evaluation of her newly diagnosed breast cancer. She is accompanied by her husband. She is doing well overall.   She had routine screening mammography on 11/10/2019 that showed an indeterminate architectural distortion in the right breast. She underwent unilateral diagnostic mammography with tomography and right breast ultrason[facility] at [facility] on 11/25/2019 that showed a 1.1 x 0.8 x 0.9 cm irregular mass in the right breast at the 3 o'clock position that was highly suggestive of malignancy.  Biopsy on 12/03/2019 revealed grade 2 invasive ductal carcinoma with ductal carcinoma in-situ. Prognostic indicators were significant for estrogen receptor, 95% positive and progesterone receptor, 80% positive, both with strong staining intensities. Proliferation marker Ki67 at 10%. HER2 negative.  Menarche: 64 years old Age at first live birth: 64 years old GP: 2 LMP: 2001 Contraceptive: Yes, from 34 - 1980 HRT: No   The patient was referred today for presentation in the multidisciplinary conference.  Radiology studies and pathology slides were presented there for review and discussion of  treatment options.  A consensus was discussed regarding potential next steps.  PREVIOUS RADIATION THERAPY: No  PAST MEDICAL HISTORY:  Past Medical History:  Diagnosis Date  . Allergic rhinitis   . Arthritis    knee  . Asthma   . Breast cancer ([facility])   . History of chicken pox   . Migraine   . Rheumatic fever    as a child.    PAST SURGICAL HISTORY: Past Surgical History:  Procedure Laterality Date  . APPENDECTOMY  11/1971  . COLONOSCOPY    . TONSILLECTOMY     when she was 2 or 3    FAMILY HISTORY:  Family History  Problem Relation Age of Onset  . Coronary artery disease Maternal Grandfather   . Diabetes Father   . Stroke Father   . Cancer Father        bladder  . Leukemia Father   . Stroke Maternal Grandmother        8s  . Stroke Paternal Grandmother   . Lung cancer Mother   . Colon cancer Maternal Uncle   . Breast cancer Paternal Aunt   . Esophageal cancer Neg Hx     SOCIAL HISTORY:  Social History   Socioeconomic History  . Marital status: Married    Spouse name: Not on file  . Number of children: 2  . Years of education: Not on file  . Highest education level: Not on file  Occupational History  . Occupation: IT trainer  Tobacco Use  . Smoking status: Never Smoker  . Smokeless tobacco: Never Used  Vaping Use  . Vaping Use: Never used  Substance and Sexual Activity  . Alcohol  use: No  . Drug use: No  . Sexual activity: Not on file  Other Topics Concern  . Not on file  Social History Narrative  . Not on file   Social Determinants of Health   Financial Resource Strain:   . Difficulty of Paying Living Expenses:   Food Insecurity:   . Worried About Charity fundraiser in the Last Year:   . Arboriculturist in the Last Year:   Transportation Needs:   . Film/video editor (Medical):   Marland Kitchen Lack of Transportation (Non-Medical):   Physical Activity:   . Days of Exercise per Week:   . Minutes of Exercise per Session:   Stress:   . Feeling of Stress  :   Social Connections:   . Frequency of Communication with Friends and Family:   . Frequency of Social Gatherings with Friends and Family:   . Attends Religious Services:   . Active Member of Clubs or Organizations:   . Attends Archivist Meetings:   Marland Kitchen Marital Status:     ALLERGIES:  Allergies  Allergen Reactions  . Penicillins     MEDICATIONS:  Current Outpatient Medications  Medication Sig Dispense Refill  . albuterol (VENTOLIN HFA) 108 (90 BASE) MCG/ACT inhaler Inhale 2 puffs into the lungs every 6 (six) hours as needed. 1 Inhaler 1  . blood glucose meter kit and supplies Check glucose once daily. (Patient not taking: Reported on 08/04/2019) 1 each 0  . Calcium Carbonate-Vitamin D (CALCIUM 500 + D PO) Take 1 tablet by mouth 2 (two) times daily.    Marland Kitchen co-enzyme Q-10 30 MG capsule Take 30 mg by mouth daily.      . fexofenadine (ALLEGRA) 180 MG tablet Take 180 mg by mouth daily.    . Misc Natural Products (TART CHERRY ADVANCED PO) Take by mouth.    . Multiple Vitamin (MULTIVITAMIN) tablet Take 1 tablet by mouth daily.      . pantoprazole (PROTONIX) 40 MG tablet Take 1 tablet (40 mg total) by mouth daily. 90 tablet 3  . sertraline (ZOLOFT) 50 MG tablet Take 1 tablet (50 mg total) by mouth daily. 30 tablet 3  . TURMERIC PO Take by mouth.     No current facility-administered medications for this encounter.    REVIEW OF SYSTEMS: A 10+ POINT REVIEW OF SYSTEMS WAS OBTAINED including neurology, dermatology, psychiatry, cardiac, respiratory, lymph, extremities, GI, GU, musculoskeletal, constitutional, reproductive, HEENT. On the provided form, she reports lump in right breast, wearing contacts and glasses, ulcer, and history of anxiety and arthritis. She denies chest pain, cough, shortness of breath, dysuria, skin changes, and any other symptoms.    PHYSICAL EXAM:   Vitals with BMI 12/10/2019  Height '5\' 5"'$   Weight 177 lbs 10 oz  BMI 24.26  Systolic 834  Diastolic 86  Pulse  81   Lungs are clear to auscultation bilaterally. Heart has regular rate and rhythm. No palpable cervical, supraclavicular, or axillary adenopathy. Abdomen soft, non-tender, normal bowel sounds. Left breast with no palpable mass, nipple discharge, or bleeding.  Right breast with bruising in the medial aspect of the breast. No palpable mass, nipple discharge, or bleeding.   KPS = 90  100 - Normal; no complaints; no evidence of disease. 90   - Able to carry on normal activity; minor signs or symptoms of disease. 80   - Normal activity with effort; some signs or symptoms of disease. 25   - Cares for self; unable  to carry on normal activity or to do active work. 60   - Requires occasional assistance, but is able to care for most of his personal needs. 50   - Requires considerable assistance and frequent medical care. 10   - Disabled; requires special care and assistance. 40   - Severely disabled; hospital admission is indicated although death not imminent. 48   - Very sick; hospital admission necessary; active supportive treatment necessary. 10   - Moribund; fatal processes progressing rapidly. 0     - Dead  Karnofsky DA, Abelmann [place], Craver LS and Burchenal [facility] [phone]) The use of the nitrogen mustards in the palliative treatment of carcinoma: with particular reference to bronchogenic carcinoma Cancer 1 634-56  LABORATORY DATA:  Lab Results  Component Value Date   WBC 8.2 12/10/2019   HGB 14.1 12/10/2019   HCT 43.0 12/10/2019   MCV 93.3 12/10/2019   PLT 303 12/10/2019   Lab Results  Component Value Date   NA 140 12/10/2019   K 4.5 12/10/2019   CL 105 12/10/2019   CO2 24 12/10/2019   Lab Results  Component Value Date   ALT 22 12/10/2019   AST 22 12/10/2019   ALKPHOS 140 (H) 12/10/2019   BILITOT 0.5 12/10/2019    PULMONARY FUNCTION TEST:   Recent Review Flowsheet Data   There is no flowsheet data to display.     RADIOGRAPHY: No results found.    IMPRESSION: Stage T1c,  Nx, Mx, Right Breast UIQ, Invasive Ductal Carcinoma with DCIS, ER+ / PR+ / Her2-, Grade 2  Patient will be a good candidate for breast conservation with radiotherapy to the right breast. We discussed the general course of radiation, potential side effects, and toxicities with radiation and the patient is interested in this approach.   PLAN:  1. Right lumpectomy with sentinel lymph node biopsy 2. Oncotype DX 3. Adjuvant radiation therapy 4. Aromatase inhibitor   ------------------------------------------------  Blair Promise, PhD, MD  This document serves as a record of services personally performed by Gery Pray, MD. It was created on his behalf by Clerance Lav, a trained medical scribe. The creation of this record is based on the scribe's personal observations and the provider's statements to them. This document has been checked and approved by the attending provider.

## 2019-12-10 ENCOUNTER — Encounter: Payer: Self-pay | Admitting: Hematology

## 2019-12-10 ENCOUNTER — Encounter: Payer: Self-pay | Admitting: General Practice

## 2019-12-10 ENCOUNTER — Inpatient Hospital Stay: Payer: 59 | Attending: Hematology

## 2019-12-10 ENCOUNTER — Other Ambulatory Visit: Payer: Self-pay

## 2019-12-10 ENCOUNTER — Encounter: Payer: Self-pay | Admitting: Physical Therapy

## 2019-12-10 ENCOUNTER — Ambulatory Visit: Payer: 59 | Attending: General Surgery | Admitting: Physical Therapy

## 2019-12-10 ENCOUNTER — Other Ambulatory Visit: Payer: Self-pay | Admitting: General Surgery

## 2019-12-10 ENCOUNTER — Inpatient Hospital Stay (HOSPITAL_BASED_OUTPATIENT_CLINIC_OR_DEPARTMENT_OTHER): Payer: 59 | Admitting: Hematology

## 2019-12-10 ENCOUNTER — Ambulatory Visit
Admission: RE | Admit: 2019-12-10 | Discharge: 2019-12-10 | Disposition: A | Discharge status: Home / Self Care | Payer: 59 | Source: Ambulatory Visit | Attending: Radiation Oncology | Admitting: Radiation Oncology

## 2019-12-10 VITALS — BP 132/86 | HR 81 | Temp 98.3°F | Resp 18 | Ht 65.0 in | Wt 177.6 lb

## 2019-12-10 DIAGNOSIS — R293 Abnormal posture: Secondary | ICD-10-CM | POA: Diagnosis present

## 2019-12-10 DIAGNOSIS — Z803 Family history of malignant neoplasm of breast: Secondary | ICD-10-CM

## 2019-12-10 DIAGNOSIS — C50211 Malignant neoplasm of upper-inner quadrant of right female breast: Secondary | ICD-10-CM | POA: Insufficient documentation

## 2019-12-10 DIAGNOSIS — J45909 Unspecified asthma, uncomplicated: Secondary | ICD-10-CM

## 2019-12-10 DIAGNOSIS — M1711 Unilateral primary osteoarthritis, right knee: Secondary | ICD-10-CM | POA: Diagnosis not present

## 2019-12-10 DIAGNOSIS — G43909 Migraine, unspecified, not intractable, without status migrainosus: Secondary | ICD-10-CM | POA: Insufficient documentation

## 2019-12-10 DIAGNOSIS — Z801 Family history of malignant neoplasm of trachea, bronchus and lung: Secondary | ICD-10-CM | POA: Diagnosis not present

## 2019-12-10 DIAGNOSIS — Z170 Estrogen receptor positive status [ER+]: Secondary | ICD-10-CM | POA: Insufficient documentation

## 2019-12-10 DIAGNOSIS — Z800 Family history of malignant neoplasm of digestive organs: Secondary | ICD-10-CM | POA: Insufficient documentation

## 2019-12-10 DIAGNOSIS — Z806 Family history of leukemia: Secondary | ICD-10-CM | POA: Insufficient documentation

## 2019-12-10 DIAGNOSIS — Z8052 Family history of malignant neoplasm of bladder: Secondary | ICD-10-CM | POA: Insufficient documentation

## 2019-12-10 DIAGNOSIS — F419 Anxiety disorder, unspecified: Secondary | ICD-10-CM | POA: Diagnosis not present

## 2019-12-10 DIAGNOSIS — R748 Abnormal levels of other serum enzymes: Secondary | ICD-10-CM | POA: Diagnosis not present

## 2019-12-10 LAB — CMP (CANCER CENTER ONLY)
ALT: 22 U/L (ref 0–44)
AST: 22 U/L (ref 15–41)
Albumin: 4.3 g/dL (ref 3.5–5.0)
Alkaline Phosphatase: 140 U/L — ABNORMAL HIGH (ref 38–126)
Anion gap: 11 (ref 5–15)
BUN: 17 mg/dL (ref 8–23)
CO2: 24 mmol/L (ref 22–32)
Calcium: 10.7 mg/dL — ABNORMAL HIGH (ref 8.9–10.3)
Chloride: 105 mmol/L (ref 98–111)
Creatinine: 0.92 mg/dL (ref 0.44–1.00)
GFR, Est AFR Am: >60 mL/min (ref >60)
GFR, Estimated: >60 mL/min (ref >60)
Glucose, Bld: 109 mg/dL — ABNORMAL HIGH (ref 70–99)
Potassium: 4.5 mmol/L (ref 3.5–5.1)
Sodium: 140 mmol/L (ref 135–145)
Total Bilirubin: 0.5 mg/dL (ref 0.3–1.2)
Total Protein: 8.1 g/dL (ref 6.5–8.1)

## 2019-12-10 LAB — CBC WITH DIFFERENTIAL (CANCER CENTER ONLY)
Abs Immature Granulocytes: 0.02 10*3/uL (ref 0.00–0.07)
Basophils Absolute: 0 10*3/uL (ref 0.0–0.1)
Basophils Relative: 1 %
Eosinophils Absolute: 0.1 10*3/uL (ref 0.0–0.5)
Eosinophils Relative: 1 %
HCT: 43 % (ref 36.0–46.0)
Hemoglobin: 14.1 g/dL (ref 12.0–15.0)
Immature Granulocytes: 0 %
Lymphocytes Relative: 27 %
Lymphs Abs: 2.2 10*3/uL (ref 0.7–4.0)
MCH: 30.6 pg (ref 26.0–34.0)
MCHC: 32.8 g/dL (ref 30.0–36.0)
MCV: 93.3 fL (ref 80.0–100.0)
Monocytes Absolute: 0.4 10*3/uL (ref 0.1–1.0)
Monocytes Relative: 5 %
Neutro Abs: 5.4 10*3/uL (ref 1.7–7.7)
Neutrophils Relative %: 66 %
Platelet Count: 303 10*3/uL (ref 150–400)
RBC: 4.61 MIL/uL (ref 3.87–5.11)
RDW: 12.4 % (ref 11.5–15.5)
WBC Count: 8.2 10*3/uL (ref 4.0–10.5)
nRBC: 0 % (ref 0.0–0.2)

## 2019-12-10 LAB — GENETIC SCREENING ORDER

## 2019-12-10 NOTE — Patient Instructions (Signed)

## 2019-12-10 NOTE — Therapy (Signed)
[place], [place], [address] Phone: [phone]   Fax:  [phone]  Physical Therapy Evaluation  Patient Details  Name: Katherine Hanna MRN: [id number] Date of Birth: 08-11-1955 Referring Provider (PT): Dr. Stark Klein   Encounter Date: 12/10/2019   PT End of Session - 12/10/19 1439    Visit Number 1    Number of Visits 2    Date for PT Re-Evaluation 02/04/20    PT Start Time 7169    PT Stop Time 1430    PT Time Calculation (min) 25 min    Activity Tolerance Patient tolerated treatment well    Behavior During Therapy [facility] for tasks assessed/performed           Past Medical History:  Diagnosis Date  . Allergic rhinitis   . Arthritis    knee  . Asthma   . Breast cancer ([place])   . History of chicken pox   . Migraine   . Rheumatic fever    as a child.    Past Surgical History:  Procedure Laterality Date  . APPENDECTOMY  11/1971  . COLONOSCOPY    . TONSILLECTOMY     when she was 2 or 3    There were no vitals filed for this visit.    Subjective Assessment - 12/10/19 1429    Subjective Patient reports she is here today to be seen by her medical team for her newly diagnosed right breast cancer.    Patient is accompained by: Family member    Pertinent History Patient was diagnosed on 11/10/2019 with right grade II invasive ductal carcinoma breast cancer. It measures 1.1 cm and is located in the upper inner quadrant. It is ER/PR positive and HER2 negative with a Ki67 of 10%.    Patient Stated Goals Reduce lymphedema risk and learn post op shoulder ROM HEP    Currently in Pain? No/denies              [facility] PT Assessment - 12/10/19 0001      Assessment   Medical Diagnosis Right breast cancer    Referring Provider (PT) Dr. Stark Klein    Onset Date/Surgical Date 11/10/19    Hand Dominance Right    Prior Therapy none      Precautions   Precautions Other (comment)    Precaution  Comments active cancer      Restrictions   Weight Bearing Restrictions No      Balance Screen   Has the patient fallen in the past 6 months No    Has the patient had a decrease in activity level because of a fear of falling?  No    Is the patient reluctant to leave their home because of a fear of falling?  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    Leisure She does not exercise      Cognition   Overall Cognitive Status Within Functional Limits for tasks assessed      Posture/Postural Control   Posture/Postural Control Postural limitations    Postural Limitations Rounded Shoulders;Forward head      ROM / Strength   AROM / PROM / Strength AROM;Strength  AROM   Overall AROM Comments Cervical AROM is WNL    AROM Assessment Site Shoulder    Right/Left Shoulder Right;Left    Right Shoulder Extension 51 Degrees    Right Shoulder Flexion 152 Degrees    Right Shoulder ABduction 157 Degrees    Right Shoulder Internal Rotation 54 Degrees    Right Shoulder External Rotation 83 Degrees    Left Shoulder Extension 50 Degrees    Left Shoulder Flexion 151 Degrees    Left Shoulder ABduction 165 Degrees    Left Shoulder Internal Rotation 68 Degrees    Left Shoulder External Rotation 77 Degrees      Strength   Overall Strength Within functional limits for tasks performed             LYMPHEDEMA/ONCOLOGY QUESTIONNAIRE - 12/10/19 0001      Type   Cancer Type Right breast cancer      Lymphedema Assessments   Lymphedema Assessments Upper extremities      Right Upper Extremity Lymphedema   10 cm Proximal to Olecranon Process 31.6 cm    Olecranon Process 26.3 cm    10 cm Proximal to Ulnar Styloid Process 23.9 cm    Just Proximal to  Ulnar Styloid Process 16.5 cm    Across Hand at [employer] 18.5 cm    At [place] of 2nd Digit 6.2 cm      Left Upper Extremity Lymphedema   10 cm Proximal to Olecranon Process 30 cm    Olecranon Process 25.7 cm    10 cm Proximal to Ulnar Styloid Process 22.8 cm    Just Proximal to Ulnar Styloid Process 16.1 cm    Across Hand at [employer] 18.3 cm    At [place] of 2nd Digit 5.9 cm           L-DEX FLOWSHEETS - 12/10/19 1400      L-DEX LYMPHEDEMA SCREENING   Measurement Type Unilateral    L-DEX MEASUREMENT EXTREMITY Upper Extremity    POSITION  Standing    DOMINANT SIDE Right    At Risk Side Right    BASELINE SCORE (UNILATERAL) 4.3          The patient was assessed using the L-Dex machine today to produce a lymphedema index baseline score. The patient will be reassessed on a regular basis (typically every 3 months) to obtain new L-Dex scores. If the score is > 6.5 points away from his/her baseline score indicating onset of subclinical lymphedema, it will be recommended to wear a compression garment for 4 weeks, 12 hours per day and then be reassessed. If the score continues to be > 6.5 points from baseline at reassessment, we will initiate lymphedema treatment. Assessing in this manner has a 95% rate of preventing clinically significant lymphedema.       Katina Dung - 12/10/19 0001    Open a tight or new jar Mild difficulty    Do heavy household chores (wash walls, wash floors) No difficulty    Carry a shopping bag or briefcase No difficulty    Wash your back No difficulty    Use a knife to cut food No difficulty    Recreational activities in which you take some force or impact through your arm, shoulder, or hand (golf, hammering, tennis) No difficulty    During the past week, to what extent has your arm, shoulder or hand problem interfered with your normal social activities with family, friends, neighbors, or groups? Not at  all    During the past week, to what extent has  your arm, shoulder or hand problem limited your work or other regular daily activities Not at all    Arm, shoulder, or hand pain. Mild    Tingling (pins and needles) in your arm, shoulder, or hand Mild    Difficulty Sleeping No difficulty    DASH Score 6.82 %            Objective measurements completed on examination: See above findings.      Patient was instructed today in a home exercise program today for post op shoulder range of motion. These included active assist shoulder flexion in sitting, scapular retraction, wall walking with shoulder abduction, and hands behind head external rotation.  She was encouraged to do these twice a day, holding 3 seconds and repeating 5 times when permitted by her physician.             PT Education - 12/10/19 1438    Education Details Lymphedema risk reduction and post op shoulder ROM HEP    Person(s) Educated Patient;Spouse    Methods Explanation;Demonstration;Handout    Comprehension Returned demonstration;Verbalized understanding               PT Long Term Goals - 12/10/19 1516      PT LONG TERM GOAL #1   Title Patient will demonstrate she has regained full shoulder ROM and function post operatively compared to baselines.    Time 8    Period Weeks    Status New    Target Date 02/04/20           Breast Clinic Goals - 12/10/19 1516      Patient will be able to verbalize understanding of pertinent lymphedema risk reduction practices relevant to her diagnosis specifically related to skin care.   Time 1    Period Days    Status Achieved      Patient will be able to return demonstrate and/or verbalize understanding of the post-op home exercise program related to regaining shoulder range of motion.   Time 1    Period Days    Status Achieved      Patient will be able to verbalize understanding of the importance of attending the postoperative After Breast Cancer Class for further lymphedema risk reduction education and  therapeutic exercise.   Time 1    Period Days    Status Achieved                 Plan - 12/10/19 1440    Clinical Impression Statement Patient was diagnosed on 11/10/2019 with right grade II invasive ductal carcinoma breast cancer. It measures 1.1 cm and is located in the upper inner quadrant. It is ER/PR positive and HER2 negative with a Ki67 of 10%. Her multidisciplinary medical team met prior to her assessments to determine a recommended treatment plan. She is planning to have a right lumpectomy and sentinel node biopsy, Oncotype testing, radiation, and anti-estrogen therapy. She will benefit from a post op PT reassessment to determine needs and L-Dex screens every 3 months.    Stability/Clinical Decision Making Stable/Uncomplicated    Clinical Decision Making Low    Rehab Potential Excellent    PT Frequency --   Eval and 1 f/u visit; L-Dex screens every 3 months   PT Treatment/Interventions ADLs/Self Care Home Management;Therapeutic exercise;Patient/family education    PT Next Visit Plan Will reassess 3-4 weeks post op to determine needs    PT  Home Exercise Plan Post op shoulder ROM HEP    Consulted and Agree with Plan of Care Patient;Family member/caregiver    Family Member Consulted Husband           Patient will benefit from skilled therapeutic intervention in order to improve the following deficits and impairments:  Postural dysfunction, Decreased range of motion, Decreased knowledge of precautions, Impaired UE functional use, Pain  Visit Diagnosis: Malignant neoplasm of upper-inner quadrant of right breast in female, estrogen receptor positive ([place]) - Plan: PT plan of care cert/re-cert  Abnormal posture - Plan: PT plan of care cert/re-cert   Patient will follow up at outpatient cancer rehab 3-4 weeks following surgery.  If the patient requires physical therapy at that time, a specific plan will be dictated and sent to the referring physician for approval. The patient was  educated today on appropriate basic range of motion exercises to begin post operatively and the importance of attending the After Breast Cancer class following surgery.  Patient was educated today on lymphedema risk reduction practices as it pertains to recommendations that will benefit the patient immediately following surgery.  She verbalized good understanding.      Problem List Patient Active Problem List   Diagnosis Date Noted  . Malignant neoplasm of upper-inner quadrant of right breast in female, estrogen receptor positive ([place]) 12/08/2019  . Seasonal allergies 07/30/2017  . Need for diphtheria-tetanus-pertussis (Tdap) vaccine 07/30/2017  . Vitamin D deficiency 06/28/2016  . Osteoporosis 06/18/2015  . Asthma with bronchitis 06/26/2014  . Food allergy 02/28/2012  . Depression 10/11/2010  . Preventative health care 10/05/2010  . Screening for malignant neoplasm of the cervix 10/05/2010  . COMMON MIGRAINE 08/31/2009  . ALLERGIC RHINITIS 08/31/2009  . CERVICAL POLYP 08/31/2009  . ARTHRITIS 08/31/2009  . POSTMENOPAUSAL STATUS 08/31/2009   Annia Friendly, PT 12/10/19 3:19 PM  [place], [place], [address] Phone: [phone]   Fax:  [phone]  Name: Katherine Hanna MRN: [id number] Date of Birth: [birth year]

## 2019-12-10 NOTE — Progress Notes (Signed)
Grandfather Psychosocial Distress Screening Spiritual Care  Met with Katherine Hanna and her husband in [facility] to introduce [facility] team/resources, reviewing distress screen per protocol.  The patient scored a 6 on the Psychosocial Distress Thermometer which indicates moderate distress. Also assessed for distress and other psychosocial needs.   ONCBCN DISTRESS SCREENING 12/10/2019  Screening Type Initial Screening  Distress experienced in past week (1-10) 6  Practical problem type Work/school  Emotional problem type Adjusting to illness  Referral to support programs Yes    [name] reports reduced distress now that she has a plan of care, though she anticipates natural intermittent anxiety along the way. She reports good support from friends and family. Encouraged Katherine Hanna programming as additional layer of meaning-making and connection.  Follow up needed: No. Per couple, no other needs at this time, but they know to reach out to Team as needed/desired.   [place], [place], [facility] Pager [phone] Voicemail [phone]

## 2019-12-12 ENCOUNTER — Encounter: Payer: Self-pay | Admitting: *Deleted

## 2019-12-18 ENCOUNTER — Encounter: Payer: Self-pay | Admitting: *Deleted

## 2019-12-18 ENCOUNTER — Telehealth: Payer: Self-pay | Admitting: *Deleted

## 2019-12-18 NOTE — Telephone Encounter (Signed)
Spoke to pt concerning Katherine Hanna from 7.21.21. Denies questions or concerns regarding dx or treatment care plan. Encourage pt to call with needs. Received verbal understanding.

## 2019-12-23 NOTE — Progress Notes (Signed)
Nutrition  Patient identified by attending Breast Clinic on 12/10/19.  Patient received nutrition packet by nurse navigator during clinic visit.    Chart reviewed.   Patient with newly diagnosed breast cancer.  Planning lumpectomy, oncotype, radiation and antiestrogens.    Ht: 65 inches Wt: 177 lb BMI: 29  Patient currently not at nutritional risk.  RD available if nutritional status changes.  Patient has RD contact information included in packet.    Shere Eisenhart B. Zenia Resides, [place], [place] Registered Dietitian [phone] (mobile)

## 2020-01-06 ENCOUNTER — Encounter: Payer: Self-pay | Admitting: Family Medicine

## 2020-01-06 MED ORDER — SERTRALINE HCL 50 MG PO TABS: 50.0000 mg | ORAL_TABLET | Freq: Every day | ORAL | 3 refills | Status: DC

## 2020-01-06 NOTE — Telephone Encounter (Signed)
That is fine to cont'

## 2020-01-13 ENCOUNTER — Encounter (HOSPITAL_BASED_OUTPATIENT_CLINIC_OR_DEPARTMENT_OTHER): Payer: Self-pay | Admitting: General Surgery

## 2020-01-13 ENCOUNTER — Other Ambulatory Visit: Payer: Self-pay

## 2020-01-16 ENCOUNTER — Other Ambulatory Visit (HOSPITAL_COMMUNITY)
Admission: RE | Admit: 2020-01-16 | Discharge: 2020-01-16 | Disposition: A | Discharge status: Home / Self Care | Payer: 59 | Source: Ambulatory Visit | Attending: General Surgery | Admitting: General Surgery

## 2020-01-16 DIAGNOSIS — Z01812 Encounter for preprocedural laboratory examination: Secondary | ICD-10-CM | POA: Insufficient documentation

## 2020-01-16 DIAGNOSIS — Z20822 Contact with and (suspected) exposure to covid-19: Secondary | ICD-10-CM | POA: Diagnosis not present

## 2020-01-16 LAB — SARS CORONAVIRUS 2 (TAT 6-24 HRS): SARS Coronavirus 2: NEGATIVE

## 2020-01-19 MED ORDER — ENSURE PRE-SURGERY PO LIQD: 296.0000 mL | Freq: Once | ORAL | Status: DC

## 2020-01-19 NOTE — H&P (Signed)
Katherine Hanna Location: [facility] Surgery Patient #: [id number] DOB: 12-17-1955 Undefined / Language: Cleophus Molt / Race: White Female   History of Present Illness The patient is a 64 year old female who presents with breast cancer. Pt is a 64 yo F who presents with screening detected breast cancer 11/2019. She had screening detected architectural distortion. Diagnostic imaging revealed a spiculated mass that is 1.1 cm at 3 o'clock. No other lesions were found. She had core needle biopsy that showed a grade 2 invasive ductal carcinoma with DCIS, +/+/-, Ki 67 10%.   She is planning on retiring at the end of the year. 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   She has no other personal cancer history but has family cancer history. Her dad had bladder cancer and leukemia, her paternal aunt had breast cancer. Mother had lung cancer at age 3 and a maternal uncle with colon cancer. She had menarche at age 74. Menopause was age 44. She is a G2P2 with first child at age 48. She has not used HRT, but used hormonal contraception for around 6-7 years. She is due for colonoscopy this year and had bone density study around 3 years ago.     pathology 12/03/2019 Breast, right, needle core biopsy, 3 o'clock, 4 cmfn - INVASIVE DUCTAL CARCINOMA. - DUCTAL CARCINOMA IN SITU. The carcinoma appears grade 2. Estrogen Receptor: 95%, POSITIVE, STRONG STAINING INTENSITY Progesterone Receptor: 80%, POSITIVE, STRONG STAINING INTENSITY Proliferation Marker Ki67: 10% GROUP 5: HER2 **NEGATIVE**  Labs 12/10/2019 CBC normal CMET wtih sl elevated calcium at 10.7 and alk phos sl elevated at 140.   Past Surgical History  Appendectomy  Breast Biopsy  Right. Oral Surgery  Tonsillectomy   Diagnostic Studies History  Colonoscopy  5-10 years ago Mammogram  within last year Pap Smear  1-5 years ago  Allergies Penicillins   Medication History  Albuterol Sulfate (108 (90  Base)MCG/ACT Aero Pow Br Act, Inhalation) Active. Calcium Ascorbate (500MG Tablet, Oral) Active. Co-Enzyme Q-10 (30MG Capsule, Oral) Active. Allegra Allergy (180MG Tablet, Oral) Active. Tart Cherry Advanced (Oral) Active. Multiple Vit/Minerals/No Iron (Oral) Active. Protonix (40MG Tablet DR, Oral) Active. Sertraline HCl (50MG Tablet, Oral) Active. Turmeric (Oral) Specific strength unknown - Active.  Social History  Caffeine use  Carbonated beverages. No alcohol use  No drug use  Tobacco use  Never smoker.  Family History  Alcohol Abuse  Family Members In General. Arthritis  Mother. Breast Cancer  Family Members In General. Cerebrovascular Accident  Father. Colon Cancer  Family Members In General. Diabetes Mellitus  Brother, Father. Respiratory Condition  Mother.  Pregnancy / Birth History  Age at menarche  56 years. Age of menopause  <43 Contraceptive History  Oral contraceptives. Gravida  2 Length (months) of breastfeeding  3-6 Maternal age  68-30 Para  2 Regular periods   Other Problems Arthritis  Asthma  Depression  Gastric Ulcer  Hemorrhoids  Migraine Headache     Review of Systems  General Not Present- Appetite Loss, Chills, Fatigue, Fever, Night Sweats, Weight Gain and Weight Loss. Skin Not Present- Change in Wart/Mole, Dryness, Hives, Jaundice, New Lesions, Non-Healing Wounds, Rash and Ulcer. HEENT Present- Seasonal Allergies and Wears glasses/contact lenses. Not Present- Earache, Hearing Loss, Hoarseness, Nose Bleed, Oral Ulcers, Ringing in the Ears, Sinus Pain, Sore Throat, Visual Disturbances and Yellow Eyes. Respiratory Not Present- Bloody sputum, Chronic Cough, Difficulty Breathing, Snoring and Wheezing. Breast Not Present- Breast Mass, Breast Pain, Nipple Discharge and Skin Changes. Cardiovascular Present-  Leg Cramps. Not Present- Chest Pain, Difficulty Breathing Lying Down, Palpitations, Rapid Heart Rate, Shortness of  Breath and Swelling of Extremities. Gastrointestinal Present- Hemorrhoids. Not Present- Abdominal Pain, Bloating, Bloody Stool, Change in Bowel Habits, Chronic diarrhea, Constipation, Difficulty Swallowing, Excessive gas, Gets full quickly at meals, Indigestion, Nausea, Rectal Pain and Vomiting. Female Genitourinary Not Present- Frequency, Nocturia, Painful Urination, Pelvic Pain and Urgency. Musculoskeletal Present- Joint Pain. Not Present- Back Pain, Joint Stiffness, Muscle Pain, Muscle Weakness and Swelling of Extremities. Neurological Not Present- Decreased Memory, Fainting, Headaches, Numbness, Seizures, Tingling, Tremor, Trouble walking and Weakness. Psychiatric Present- Depression. Not Present- Anxiety, Bipolar, Change in Sleep Pattern, Fearful and Frequent crying. Endocrine Not Present- Cold Intolerance, Excessive Hunger, Hair Changes, Heat Intolerance, Hot flashes and New Diabetes. Hematology Not Present- Blood Thinners, Easy Bruising, Excessive bleeding, Gland problems, HIV and Persistent Infections.  Vitals  Weight: 177.6 lb Height: 65in Body Surface Area: 1.88 m Body Mass Index: 29.55 kg/m  Temp.: 98.20F  Pulse: 81 (Regular)  Resp.: 18 (Unlabored)  BP: 132/86(Sitting, Left Arm, Standard)       Physical Exam  General Mental Status-Alert. General Appearance-Consistent with stated age. Hydration-Well hydrated. Voice-Normal.  Head and Neck Head-normocephalic, atraumatic with no lesions or palpable masses. Trachea-midline. Thyroid Gland Characteristics - normal size and consistency.  Eye Eyeball - Bilateral-Extraocular movements intact. Sclera/Conjunctiva - Bilateral-No scleral icterus.  Chest and Lung Exam Chest and lung exam reveals -quiet, even and easy respiratory effort with no use of accessory muscles and on auscultation, normal breath sounds, no adventitious sounds and normal vocal resonance. Inspection Chest Wall - Normal. Back  - normal.  Breast Note: breasts relatively symmetric. She has mild ptosis, but still has relatively dense breast tissue. No palpable mass. No nipple retraction or nipple discharge. Faint bruising left medial breast. no LAD. no skin dimpling.   Cardiovascular Cardiovascular examination reveals -normal heart sounds, regular rate and rhythm with no murmurs and normal pedal pulses bilaterally.  Abdomen Inspection Inspection of the abdomen reveals - No Hernias. Palpation/Percussion Palpation and Percussion of the abdomen reveal - Soft, Non Tender, No Rebound tenderness, No Rigidity (guarding) and No hepatosplenomegaly. Auscultation Auscultation of the abdomen reveals - Bowel sounds normal.  Neurologic Neurologic evaluation reveals -alert and oriented x 3 with no impairment of recent or remote memory. Mental Status-Normal.  Musculoskeletal Global Assessment -Note: no gross deformities.  Normal Exam - Left-Upper Extremity Strength Normal and Lower Extremity Strength Normal. Normal Exam - Right-Upper Extremity Strength Normal and Lower Extremity Strength Normal.  Lymphatic Head & Neck  General Head & Neck Lymphatics: Bilateral - Description - Normal. Axillary  General Axillary Region: Bilateral - Description - Normal. Tenderness - Non Tender. Femoral & Inguinal  Generalized Femoral & Inguinal Lymphatics: Bilateral - Description - No Generalized lymphadenopathy.    Assessment & Plan MALIGNANT NEOPLASM OF UPPER-INNER QUADRANT OF RIGHT BREAST IN FEMALE, ESTROGEN RECEPTOR POSITIVE (C50.211) Impression: Pt has a new diagnosis of right breast cancer, cT1cN0. Pt is good candidate for breast conservation with seed localized lumpectomy and SLN bx. This would be followed by radiation and antiestrogen tx. Oncotype will also be sent to determine if any benefit of chemotherapy.  The surgical procedure was described to the patient. I discussed the incision type and location and  that we would need radiology involved on with a wire or seed marker and/or sentinel node.  The risks and benefits of the procedure were described to the patient and she wishes to proceed.  We discussed the risks bleeding, infection,  damage to other structures, need for further procedures/surgeries. We discussed the risk of seroma. The patient was advised if the area in the breast in cancer, we may need to go back to surgery for additional tissue to obtain negative margins or for a lymph node biopsy. The patient was advised that these are the most common complications, but that others can occur as well. They were advised against taking aspirin or other anti-inflammatory agents/blood thinners the week before surgery. Current Plans You are being scheduled for surgery- Our schedulers will call you.  You should hear from our office's scheduling department within 5 working days about the location, date, and time of surgery. We try to make accommodations for patient's preferences in scheduling surgery, but sometimes the OR schedule or the surgeon's schedule prevents [place] from making those accommodations.  If you have not heard from our office [phone]) in 5 working days, call the office and ask for your surgeon's nurse.  If you have other questions about your diagnosis, plan, or surgery, call the office and ask for your surgeon's nurse.  Pt Education - flb breast cancer surgery: discussed with patient and provided information.

## 2020-01-19 NOTE — Progress Notes (Signed)

## 2020-01-20 ENCOUNTER — Encounter (HOSPITAL_BASED_OUTPATIENT_CLINIC_OR_DEPARTMENT_OTHER): Payer: Self-pay | Admitting: General Surgery

## 2020-01-20 ENCOUNTER — Other Ambulatory Visit: Payer: Self-pay

## 2020-01-20 ENCOUNTER — Encounter (HOSPITAL_COMMUNITY)
Admission: RE | Admit: 2020-01-20 | Discharge: 2020-01-20 | Disposition: A | Discharge status: Home / Self Care | Payer: 59 | Source: Ambulatory Visit | Attending: General Surgery | Admitting: General Surgery

## 2020-01-20 ENCOUNTER — Ambulatory Visit (HOSPITAL_BASED_OUTPATIENT_CLINIC_OR_DEPARTMENT_OTHER)
Admission: RE | Admit: 2020-01-20 | Discharge: 2020-01-20 | Disposition: A | Discharge status: Home / Self Care | Payer: 59 | Attending: General Surgery | Admitting: General Surgery

## 2020-01-20 ENCOUNTER — Ambulatory Visit (HOSPITAL_BASED_OUTPATIENT_CLINIC_OR_DEPARTMENT_OTHER): Payer: 59 | Admitting: Anesthesiology

## 2020-01-20 ENCOUNTER — Encounter (HOSPITAL_BASED_OUTPATIENT_CLINIC_OR_DEPARTMENT_OTHER)
Admission: RE | Disposition: A | Discharge status: Home / Self Care | Payer: Self-pay | Source: Home / Self Care | Attending: General Surgery

## 2020-01-20 DIAGNOSIS — Z8261 Family history of arthritis: Secondary | ICD-10-CM | POA: Diagnosis not present

## 2020-01-20 DIAGNOSIS — Z803 Family history of malignant neoplasm of breast: Secondary | ICD-10-CM | POA: Insufficient documentation

## 2020-01-20 DIAGNOSIS — D360 Benign neoplasm of lymph nodes: Secondary | ICD-10-CM | POA: Insufficient documentation

## 2020-01-20 DIAGNOSIS — Z800 Family history of malignant neoplasm of digestive organs: Secondary | ICD-10-CM | POA: Insufficient documentation

## 2020-01-20 DIAGNOSIS — C50211 Malignant neoplasm of upper-inner quadrant of right female breast: Secondary | ICD-10-CM | POA: Diagnosis present

## 2020-01-20 DIAGNOSIS — J45909 Unspecified asthma, uncomplicated: Secondary | ICD-10-CM | POA: Insufficient documentation

## 2020-01-20 DIAGNOSIS — Z880 Allergy status to penicillin: Secondary | ICD-10-CM | POA: Insufficient documentation

## 2020-01-20 DIAGNOSIS — Z170 Estrogen receptor positive status [ER+]: Secondary | ICD-10-CM | POA: Diagnosis not present

## 2020-01-20 DIAGNOSIS — D0511 Intraductal carcinoma in situ of right breast: Secondary | ICD-10-CM | POA: Diagnosis not present

## 2020-01-20 DIAGNOSIS — M1990 Unspecified osteoarthritis, unspecified site: Secondary | ICD-10-CM | POA: Insufficient documentation

## 2020-01-20 HISTORY — PX: BREAST LUMPECTOMY WITH RADIOACTIVE SEED AND SENTINEL LYMPH NODE BIOPSY: SHX6550

## 2020-01-20 SURGERY — BREAST LUMPECTOMY WITH RADIOACTIVE SEED AND SENTINEL LYMPH NODE BIOPSY
Anesthesia: General | Site: Breast | Laterality: Right

## 2020-01-20 MED ORDER — ACETAMINOPHEN 500 MG PO TABS
1000.0000 mg | ORAL_TABLET | ORAL | Status: AC
  Administered 2020-01-20: 1000 mg via ORAL

## 2020-01-20 MED ORDER — ACETAMINOPHEN 500 MG PO TABS
ORAL_TABLET | ORAL | Status: AC
  Filled 2020-01-20: qty 2

## 2020-01-20 MED ORDER — CHLORHEXIDINE GLUCONATE CLOTH 2 % EX PADS: 6.0000 | MEDICATED_PAD | Freq: Once | CUTANEOUS | Status: DC

## 2020-01-20 MED ORDER — OXYCODONE HCL 5 MG/5ML PO SOLN: 5.0000 mg | Freq: Once | ORAL | Status: AC | PRN

## 2020-01-20 MED ORDER — MIDAZOLAM HCL 2 MG/2ML IJ SOLN
INTRAMUSCULAR | Status: AC
  Filled 2020-01-20: qty 2

## 2020-01-20 MED ORDER — EPHEDRINE 5 MG/ML INJ
INTRAVENOUS | Status: AC
  Filled 2020-01-20: qty 10

## 2020-01-20 MED ORDER — FENTANYL CITRATE (PF) 100 MCG/2ML IJ SOLN
INTRAMUSCULAR | Status: AC
  Filled 2020-01-20: qty 2

## 2020-01-20 MED ORDER — FENTANYL CITRATE (PF) 100 MCG/2ML IJ SOLN
INTRAMUSCULAR | Status: AC
Start: 2020-01-20 — End: ?
  Filled 2020-01-20: qty 2

## 2020-01-20 MED ORDER — DIPHENHYDRAMINE HCL 50 MG/ML IJ SOLN
INTRAMUSCULAR | Status: AC
  Filled 2020-01-20: qty 1

## 2020-01-20 MED ORDER — PHENYLEPHRINE 40 MCG/ML (10ML) SYRINGE FOR IV PUSH (FOR BLOOD PRESSURE SUPPORT)
PREFILLED_SYRINGE | INTRAVENOUS | Status: AC
  Filled 2020-01-20: qty 10

## 2020-01-20 MED ORDER — MEPERIDINE HCL 25 MG/ML IJ SOLN: 6.2500 mg | INTRAMUSCULAR | Status: DC | PRN

## 2020-01-20 MED ORDER — PROMETHAZINE HCL 25 MG/ML IJ SOLN: 6.2500 mg | INTRAMUSCULAR | Status: DC | PRN

## 2020-01-20 MED ORDER — FENTANYL CITRATE (PF) 100 MCG/2ML IJ SOLN
INTRAMUSCULAR | Status: DC | PRN
  Administered 2020-01-20: 50 ug via INTRAVENOUS

## 2020-01-20 MED ORDER — ONDANSETRON HCL 4 MG/2ML IJ SOLN
INTRAMUSCULAR | Status: AC
  Filled 2020-01-20: qty 2

## 2020-01-20 MED ORDER — OXYCODONE HCL 5 MG PO TABS
5.0000 mg | ORAL_TABLET | Freq: Once | ORAL | Status: AC | PRN
  Administered 2020-01-20: 5 mg via ORAL

## 2020-01-20 MED ORDER — MIDAZOLAM HCL 2 MG/2ML IJ SOLN: 0.5000 mg | Freq: Once | INTRAMUSCULAR | Status: DC | PRN

## 2020-01-20 MED ORDER — TECHNETIUM TC 99M SULFUR COLLOID FILTERED: 1.0000 | Freq: Once | INTRAVENOUS | Status: DC | PRN

## 2020-01-20 MED ORDER — MIDAZOLAM HCL 2 MG/2ML IJ SOLN
2.0000 mg | Freq: Once | INTRAMUSCULAR | Status: AC
  Administered 2020-01-20: 2 mg via INTRAVENOUS

## 2020-01-20 MED ORDER — DROPERIDOL 2.5 MG/ML IJ SOLN
INTRAMUSCULAR | Status: DC | PRN
  Administered 2020-01-20: .625 mg via INTRAVENOUS

## 2020-01-20 MED ORDER — PROPOFOL 10 MG/ML IV BOLUS
INTRAVENOUS | Status: DC | PRN
  Administered 2020-01-20: 150 mg via INTRAVENOUS

## 2020-01-20 MED ORDER — FENTANYL CITRATE (PF) 100 MCG/2ML IJ SOLN
25.0000 ug | INTRAMUSCULAR | Status: DC | PRN
  Administered 2020-01-20 (×2): 25 ug via INTRAVENOUS

## 2020-01-20 MED ORDER — MIDAZOLAM HCL 5 MG/5ML IJ SOLN
INTRAMUSCULAR | Status: DC | PRN
  Administered 2020-01-20: 1 mg via INTRAVENOUS

## 2020-01-20 MED ORDER — DIPHENHYDRAMINE HCL 50 MG/ML IJ SOLN
INTRAMUSCULAR | Status: DC | PRN
  Administered 2020-01-20: 6.25 mg via INTRAVENOUS

## 2020-01-20 MED ORDER — LIDOCAINE HCL 1 % IJ SOLN
INTRAMUSCULAR | Status: DC | PRN
  Administered 2020-01-20: 10 mL via INTRAMUSCULAR

## 2020-01-20 MED ORDER — FENTANYL CITRATE (PF) 100 MCG/2ML IJ SOLN
100.0000 ug | Freq: Once | INTRAMUSCULAR | Status: AC
  Administered 2020-01-20: 100 ug via INTRAVENOUS

## 2020-01-20 MED ORDER — SUCCINYLCHOLINE CHLORIDE 200 MG/10ML IV SOSY
PREFILLED_SYRINGE | INTRAVENOUS | Status: AC
  Filled 2020-01-20: qty 10

## 2020-01-20 MED ORDER — LIDOCAINE 2% (20 MG/ML) 5 ML SYRINGE
INTRAMUSCULAR | Status: AC
  Filled 2020-01-20: qty 5

## 2020-01-20 MED ORDER — LACTATED RINGERS IV SOLN: INTRAVENOUS | Status: DC

## 2020-01-20 MED ORDER — BUPIVACAINE-EPINEPHRINE (PF) 0.5% -1:200000 IJ SOLN
INTRAMUSCULAR | Status: DC | PRN
  Administered 2020-01-20: 30 mL

## 2020-01-20 MED ORDER — OXYCODONE HCL 5 MG PO TABS
ORAL_TABLET | ORAL | Status: AC
  Filled 2020-01-20: qty 1

## 2020-01-20 MED ORDER — CIPROFLOXACIN IN D5W 400 MG/200ML IV SOLN
400.0000 mg | INTRAVENOUS | Status: AC
  Administered 2020-01-20: 400 mg via INTRAVENOUS

## 2020-01-20 MED ORDER — CIPROFLOXACIN IN D5W 400 MG/200ML IV SOLN
INTRAVENOUS | Status: AC
  Filled 2020-01-20: qty 200

## 2020-01-20 MED ORDER — OXYCODONE HCL 5 MG PO TABS: 5.0000 mg | ORAL_TABLET | Freq: Four times a day (QID) | ORAL | 0 refills | Status: DC | PRN

## 2020-01-20 MED ORDER — SCOPOLAMINE 1 MG/3DAYS TD PT72: 1.0000 | MEDICATED_PATCH | TRANSDERMAL | Status: DC

## 2020-01-20 MED ORDER — DEXAMETHASONE SODIUM PHOSPHATE 10 MG/ML IJ SOLN
INTRAMUSCULAR | Status: AC
  Filled 2020-01-20: qty 1

## 2020-01-20 SURGICAL SUPPLY — 55 items
ADH SKN CLS APL DERMABOND .7 (GAUZE/BANDAGES/DRESSINGS) ×1
APL PRP STRL LF DISP 70% ISPRP (MISCELLANEOUS) ×1
BINDER BREAST LRG (GAUZE/BANDAGES/DRESSINGS) ×3 IMPLANT
BLADE SURG 10 STRL SS (BLADE) ×3 IMPLANT
BLADE SURG 15 STRL LF DISP TIS (BLADE) ×1 IMPLANT
BLADE SURG 15 STRL SS (BLADE) ×3
BNDG COHESIVE 4X5 TAN STRL (GAUZE/BANDAGES/DRESSINGS) ×3 IMPLANT
CANISTER SUCT 1200ML W/VALVE (MISCELLANEOUS) ×3 IMPLANT
CHLORAPREP W/TINT 26 (MISCELLANEOUS) ×3 IMPLANT
CLIP VESOCCLUDE LG 6/CT (CLIP) ×3 IMPLANT
CLIP VESOCCLUDE MED 6/CT (CLIP) ×8 IMPLANT
CLOSURE WOUND 1/2 X4 (GAUZE/BANDAGES/DRESSINGS) ×1
COVER MAYO STAND STRL (DRAPES) ×6 IMPLANT
COVER PROBE W GEL 5X96 (DRAPES) ×3 IMPLANT
DERMABOND ADVANCED (GAUZE/BANDAGES/DRESSINGS) ×2
DERMABOND ADVANCED .7 DNX12 (GAUZE/BANDAGES/DRESSINGS) ×1 IMPLANT
DRAPE UTILITY XL STRL (DRAPES) ×3 IMPLANT
DRSG PAD ABDOMINAL 8X10 ST (GAUZE/BANDAGES/DRESSINGS) ×3 IMPLANT
ELECT COATED BLADE 2.86 ST (ELECTRODE) ×3 IMPLANT
ELECT REM PT RETURN 9FT ADLT (ELECTROSURGICAL) ×3
ELECTRODE REM PT RTRN 9FT ADLT (ELECTROSURGICAL) ×1 IMPLANT
GAUZE SPONGE 4X4 12PLY STRL LF (GAUZE/BANDAGES/DRESSINGS) ×3 IMPLANT
GLOVE BIO SURGEON STRL SZ 6 (GLOVE) ×3 IMPLANT
GLOVE BIOGEL PI IND STRL 6.5 (GLOVE) ×1 IMPLANT
GLOVE BIOGEL PI INDICATOR 6.5 (GLOVE) ×2
GOWN STRL REUS W/ TWL LRG LVL3 (GOWN DISPOSABLE) ×1 IMPLANT
GOWN STRL REUS W/TWL 2XL LVL3 (GOWN DISPOSABLE) ×3 IMPLANT
GOWN STRL REUS W/TWL LRG LVL3 (GOWN DISPOSABLE) ×3
KIT MARKER MARGIN INK (KITS) ×3 IMPLANT
LIGHT WAVEGUIDE WIDE FLAT (MISCELLANEOUS) ×2 IMPLANT
NDL HYPO 25X1 1.5 SAFETY (NEEDLE) ×1 IMPLANT
NDL SAFETY ECLIPSE 18X1.5 (NEEDLE) ×1 IMPLANT
NEEDLE HYPO 18GX1.5 SHARP (NEEDLE) ×3
NEEDLE HYPO 25X1 1.5 SAFETY (NEEDLE) ×3 IMPLANT
NS IRRIG 1000ML POUR BTL (IV SOLUTION) ×3 IMPLANT
PACK BASIN DAY SURGERY FS (CUSTOM PROCEDURE TRAY) ×3 IMPLANT
PACK UNIVERSAL I (CUSTOM PROCEDURE TRAY) ×3 IMPLANT
PENCIL SMOKE EVACUATOR (MISCELLANEOUS) ×3 IMPLANT
SLEEVE SCD COMPRESS KNEE MED (MISCELLANEOUS) ×3 IMPLANT
SPONGE LAP 18X18 RF (DISPOSABLE) ×6 IMPLANT
STOCKINETTE IMPERVIOUS LG (DRAPES) ×3 IMPLANT
STRIP CLOSURE SKIN 1/2X4 (GAUZE/BANDAGES/DRESSINGS) ×2 IMPLANT
SUT MNCRL AB 4-0 PS2 18 (SUTURE) ×3 IMPLANT
SUT VIC AB 2-0 SH 27 (SUTURE) ×3
SUT VIC AB 2-0 SH 27XBRD (SUTURE) ×1 IMPLANT
SUT VIC AB 3-0 SH 27 (SUTURE) ×3
SUT VIC AB 3-0 SH 27X BRD (SUTURE) ×1 IMPLANT
SUT VICRYL 3-0 CR8 SH (SUTURE) ×3 IMPLANT
SYR BULB EAR ULCER 3OZ GRN STR (SYRINGE) ×3 IMPLANT
SYR CONTROL 10ML LL (SYRINGE) ×3 IMPLANT
TOWEL GREEN STERILE FF (TOWEL DISPOSABLE) ×3 IMPLANT
TRAY FAXITRON CT DISP (TRAY / TRAY PROCEDURE) ×3 IMPLANT
TUBE CONNECTING 20'X1/4 (TUBING) ×1
TUBE CONNECTING 20X1/4 (TUBING) ×2 IMPLANT
YANKAUER SUCT BULB TIP NO VENT (SUCTIONS) ×3 IMPLANT

## 2020-01-20 NOTE — Anesthesia Procedure Notes (Signed)
Anesthesia Regional Block: Pectoralis block   Pre-Anesthetic Checklist: ,, timeout performed, Correct Patient, Correct Site, Correct Laterality, Correct Procedure, Correct Position, site marked, Risks and benefits discussed,  Surgical consent,  Pre-op evaluation,  At surgeon's request and post-op pain management  Laterality: Right  Prep: chloraprep       Needles:   Needle Type: Echogenic Needle     Needle Length: 9cm  Needle Gauge: 21     Additional Needles:   Procedures:,,,, ultrasound used (permanent image in chart),,,,  Narrative:  Start time: 01/20/2020 9:34 AM End time: 01/20/2020 9:41 AM Injection made incrementally with aspirations every 5 mL.  Performed by: Personally  Anesthesiologist: Annye Asa, MD  Additional Notes: Pt identified in Holding room.  Monitors applied. Working IV access confirmed. Sterile prep R clavicle and pec.  #21ga ECHOgenic arrow block needle between pec and serratus, between ribs 4,5 with US guidance.  30cc 0.5% Bupivacaine with 1:200k epi injected incrementally after negative test dose. Good spread across plane.  Patient asymptomatic, VSS, no heme aspirated, tolerated well.  Jenita Seashore, MD

## 2020-01-20 NOTE — Progress Notes (Signed)
Assisted Dr. Annye Asa with right, ultrasound guided, pectoralis block. Side rails up, monitors on throughout procedure. See vital signs in flow sheet. Tolerated Procedure well.

## 2020-01-20 NOTE — Interval H&P Note (Signed)
History and Physical Interval Note:  01/20/2020 8:40 AM  Katherine Hanna  has presented today for surgery, with the diagnosis of RIGHT  BREAST CANCER.  The various methods of treatment have been discussed with the patient and family. After consideration of risks, benefits and other options for treatment, the patient has consented to  Procedure(s) with comments: RIGHT BREAST LUMPECTOMY WITH RADIOACTIVE SEED AND SENTINEL LYMPH NODE BIOPSY (Right) - COMBINED WITH REGIONAL FOR POST OP PAIN, RNFA as a surgical intervention.  The patient's history has been reviewed, patient examined, no change in status, stable for surgery.  I have reviewed the patient's chart and labs.  Questions were answered to the patient's satisfaction.     Stark Klein

## 2020-01-20 NOTE — Anesthesia Procedure Notes (Signed)
Performed by: Huxton Glaus D, CRNA       

## 2020-01-20 NOTE — Anesthesia Preprocedure Evaluation (Addendum)
Anesthesia Evaluation  Patient identified by MRN, date of birth, ID band Patient awake    Reviewed: Allergy & Precautions, NPO status , Patient's Chart, lab work & pertinent test results  History of Anesthesia Complications Negative for: history of anesthetic complications  Airway Mallampati: II  TM Distance: >3 FB Neck ROM: Full    Dental  (+) Dental Advisory Given   Pulmonary asthma , COPD,  COPD inhaler,  01/16/2020 SARS coronavirus NEG   breath sounds clear to auscultation       Cardiovascular negative cardio ROS   Rhythm:Regular Rate:Normal     Neuro/Psych  Headaches, Depression    GI/Hepatic Neg liver ROS, GERD  Medicated and Controlled,  Endo/Other  negative endocrine ROS  Renal/GU negative Renal ROS     Musculoskeletal  (+) Arthritis , Osteoarthritis,    Abdominal   Peds  Hematology negative hematology ROS (+)   Anesthesia Other Findings Breast cancer  Reproductive/Obstetrics                            Anesthesia Physical Anesthesia Plan  ASA: II  Anesthesia Plan: General   Post-op Pain Management: GA combined w/ Regional for post-op pain   Induction: Intravenous  PONV Risk Score and Plan: 3 and Ondansetron, Dexamethasone and Scopolamine patch - Pre-op  Airway Management Planned: LMA  Additional Equipment: None  Intra-op Plan:   Post-operative Plan:   Informed Consent: I have reviewed the patients History and Physical, chart, labs and discussed the procedure including the risks, benefits and alternatives for the proposed anesthesia with the patient or authorized representative who has indicated his/her understanding and acceptance.     Dental advisory given  Plan Discussed with: CRNA and Surgeon  Anesthesia Plan Comments: (Plan routine monitors, GA with pectoralis block for post op analgesia)       Anesthesia Quick Evaluation

## 2020-01-20 NOTE — Op Note (Signed)
Right Breast Radioactive seed localized lumpectomy and sentinel lymph node biopsy, mastopexy  Indications: This patient presents with history of right breast cancer, upper inner quadrant, [id number] grade 2 invasive ductal with DCIS, +/+/-  Pre-operative Diagnosis: right breast cancer  Post-operative Diagnosis: Same  Surgeon: Stark Klein   Anesthesia: General endotracheal anesthesia  ASA Class: 2  Procedure Details  The patient was seen in the Holding Room. The risks, benefits, complications, treatment options, and expected outcomes were discussed with the patient. The possibilities of bleeding, infection, the need for additional procedures, failure to diagnose a condition, and creating a complication requiring transfusion or operation were discussed with the patient. The patient concurred with the proposed plan, giving informed consent.  The site of surgery properly noted/marked. The patient was taken to Operating Room # 8, identified, and the procedure verified as Right Breast seed localized Lumpectomy with sentinel lymph node biopsy. A Time Out was held and the above information confirmed.  The right arm, breast, and chest were prepped and draped in standard fashion. The lumpectomy was performed by creating a medial circumareolar incision near the previously placed radioactive seed.  Dissection was carried down to around the point of maximum signal intensity. The cautery was used to perform the dissection.  Hemostasis was achieved with cautery. The edges of the cavity were marked with large clips, with one each medial, lateral, inferior and superior, and two clips posteriorly.   The specimen was inked with the margin marker paint kit.    Specimen radiography confirmed inclusion of the mammographic lesion, the clip, and the seed.  The background signal in the breast was zero.  There was a relatively significant defect medially.  The skin was elevated and the breast tissue was dissected away from  the skin to create a flap of breast tissue to help cover the defect.  2-0 vicryl was used to pull the tissue together to minimize the defect.  Local anesthetic was also administered into the surrounding tissue.  The wound was irrigated and closed with 3-0 vicryl in layers and 4-0 monocryl subcuticular suture.    Using a hand-held gamma probe, right axillary sentinel nodes were identified transcutaneously.  An oblique incision was created below the axillary hairline.  Dissection was carried through the clavipectoral fascia.  Four deep level 2 axillary sentinel nodes were removed.  Counts per second were 410, 65, 197, and 250.    The background count was 8 cps.  The wound was irrigated.  Hemostasis was achieved with cautery.  The axillary incision was closed with a 3-0 vicryl deep dermal interrupted sutures and a 4-0 monocryl subcuticular closure.    Sterile dressings were applied. At the end of the operation, all sponge, instrument, and needle counts were correct.  Findings: grossly clear surgical margins and no adenopathy.  Skin is anterior margin and pectoralis is posterior margin.    Estimated Blood Loss:  min         Specimens: right breast lumpectomy with seed and Four right axillary sentinel lymph nodes.             Complications:  None; patient tolerated the procedure well.         Disposition: PACU - hemodynamically stable.         Condition: stable

## 2020-01-20 NOTE — Discharge Instructions (Addendum)
[facility] °Office Phone Number [phone] ° °BREAST BIOPSY/ PARTIAL MASTECTOMY: POST OP INSTRUCTIONS ° °Always review your discharge instruction sheet given to you by the facility where your surgery was performed. ° °IF YOU HAVE DISABILITY OR FAMILY LEAVE FORMS, YOU MUST BRING THEM TO THE OFFICE FOR PROCESSING.  DO NOT GIVE THEM TO YOUR DOCTOR. ° °1. A prescription for pain medication may be given to you upon discharge.  Take your pain medication as prescribed, if needed.  If narcotic pain medicine is not needed, then you may take acetaminophen (Tylenol) or ibuprofen (Advil) as needed. °2. Take your usually prescribed medications unless otherwise directed °3. If you need a refill on your pain medication, please contact your pharmacy.  They will contact our office to request authorization.  Prescriptions will not be filled after 5pm or on week-ends. °4. You should eat very light the first 24 hours after surgery, such as soup, crackers, pudding, etc.  Resume your normal diet the day after surgery. °5. Most patients will experience some swelling and bruising in the breast.  Ice packs and a good support bra will help.  Swelling and bruising can take several days to resolve.  °6. It is common to experience some constipation if taking pain medication after surgery.  Increasing fluid intake and taking a stool softener will usually help or prevent this problem from occurring.  A mild laxative (Milk of Magnesia or Miralax) should be taken according to package directions if there are no bowel movements after 48 hours. °7. Unless discharge instructions indicate otherwise, you may remove your bandages 48 hours after surgery, and you may shower at that time.  You may have steri-strips (small skin tapes) in place directly over the incision.  These strips should be left on the skin for 7-10 days.   Any sutures or staples will be removed at the office during your follow-up visit. °8. ACTIVITIES:  You may resume  regular daily activities (gradually increasing) beginning the next day.  Wearing a good support bra or sports bra (or the breast binder) minimizes pain and swelling.  You may have sexual intercourse when it is comfortable. °a. You may drive when you no longer are taking prescription pain medication, you can comfortably wear a seatbelt, and you can safely maneuver your car and apply brakes. °b. RETURN TO WORK:  __________1 week_______________ °9. You should see your doctor in the office for a follow-up appointment approximately two weeks after your surgery.  Your doctor’s nurse will typically make your follow-up appointment when she calls you with your pathology report.  Expect your pathology report 2-3 business days after your surgery.  You may call to check if you do not hear from us after three days. ° ° °WHEN TO CALL YOUR DOCTOR: °1. Fever over 101.0 °2. Nausea and/or vomiting. °3. Extreme swelling or bruising. °4. Continued bleeding from incision. °5. Increased pain, redness, or drainage from the incision. ° °The clinic staff is available to answer your questions during regular business hours.  Please don’t hesitate to call and ask to speak to one of the nurses for clinical concerns.  If you have a medical emergency, go to the nearest emergency room or call 911.  A surgeon from [facility] is always on call at the hospital. ° °For further questions, please visit [email or web address]  ° ° ° ° ° ° ° °Post Anesthesia Home Care Instructions ° °Activity: °Get plenty of rest for the remainder of the day. A responsible individual must   stay with you for 24 hours following the procedure.  °For the next 24 hours, DO NOT: °-Drive a car °-Operate machinery °-Drink alcoholic beverages °-Take any medication unless instructed by your physician °-Make any legal decisions or sign important papers. ° °Meals: °Start with liquid foods such as gelatin or soup. Progress to regular foods as tolerated. Avoid greasy,  spicy, heavy foods. If nausea and/or vomiting occur, drink only clear liquids until the nausea and/or vomiting subsides. Call your physician if vomiting continues. ° °Special Instructions/Symptoms: °Your throat may feel dry or sore from the anesthesia or the breathing tube placed in your throat during surgery. If this causes discomfort, gargle with warm salt water. The discomfort should disappear within 24 hours. ° °If you had a scopolamine patch placed behind your ear for the management of post- operative nausea and/or vomiting: ° °1. The medication in the patch is effective for 72 hours, after which it should be removed.  Wrap patch in a tissue and discard in the trash. Wash hands thoroughly with soap and water. °2. You may remove the patch earlier than 72 hours if you experience unpleasant side effects which may include dry mouth, dizziness or visual disturbances. °3. Avoid touching the patch. Wash your hands with soap and water after contact with the patch. °  ° ° ° ° ° °Regional Anesthesia Blocks ° °1. Numbness or the inability to move the "blocked" extremity may last from 3-48 hours after placement. The length of time depends on the medication injected and your individual response to the medication. If the numbness is not going away after 48 hours, call your surgeon. ° °2. The extremity that is blocked will need to be protected until the numbness is gone and the  Strength has returned. Because you cannot feel it, you will need to take extra care to avoid injury. Because it may be weak, you may have difficulty moving it or using it. You may not know what position it is in without looking at it while the block is in effect. ° °3. For blocks in the legs and feet, returning to weight bearing and walking needs to be done carefully. You will need to wait until the numbness is entirely gone and the strength has returned. You should be able to move your leg and foot normally before you try and bear weight or walk. You  will need someone to be with you when you first try to ensure you do not fall and possibly risk injury. ° °4. Bruising and tenderness at the needle site are common side effects and will resolve in a few days. ° °5. Persistent numbness or new problems with movement should be communicated to the surgeon or the [facility] ([phone])/ [facility] ([phone]). ° °

## 2020-01-20 NOTE — Anesthesia Procedure Notes (Signed)
Procedure Name: LMA Insertion Date/Time: 01/20/2020 10:15 AM Performed by: Willa Frater, CRNA Pre-anesthesia Checklist: Patient identified, Emergency Drugs available, Suction available and Patient being monitored Patient Re-evaluated:Patient Re-evaluated prior to induction Oxygen Delivery Method: Circle system utilized Preoxygenation: Pre-oxygenation with 100% oxygen Induction Type: IV induction Ventilation: Mask ventilation without difficulty LMA: LMA inserted LMA Size: 4.0 Number of attempts: 1 Airway Equipment and Method: Bite block Placement Confirmation: positive ETCO2 Tube secured with: Tape Dental Injury: Teeth and Oropharynx as per pre-operative assessment

## 2020-01-20 NOTE — Transfer of Care (Signed)
Immediate Anesthesia Transfer of Care Note  Patient: Katherine Hanna  Procedure(s) Performed: RIGHT BREAST LUMPECTOMY WITH RADIOACTIVE SEED AND SENTINEL LYMPH NODE BIOPSY (Right Breast)  Patient Location: PACU  Anesthesia Type:GA combined with regional for post-op pain  Level of Consciousness: awake, alert , oriented, drowsy and patient cooperative  Airway & Oxygen Therapy: Patient Spontanous Breathing and Patient connected to face mask oxygen  Post-op Assessment: Report given to RN and Post -op Vital signs reviewed and stable  Post vital signs: Reviewed and stable  Last Vitals:  Vitals Value Taken Time  BP    Temp    Pulse    Resp    SpO2      Last Pain:  Vitals:   01/20/20 0826  TempSrc: Oral  PainSc: 0-No pain         Complications: No complications documented.

## 2020-01-20 NOTE — Anesthesia Postprocedure Evaluation (Signed)
Anesthesia Post Note  Patient: MANAAL MANDALA  Procedure(s) Performed: RIGHT BREAST LUMPECTOMY WITH RADIOACTIVE SEED AND SENTINEL LYMPH NODE BIOPSY (Right Breast)     Patient location during evaluation: Phase II Anesthesia Type: General Level of consciousness: awake and alert, patient cooperative and oriented Pain management: pain level controlled Vital Signs Assessment: post-procedure vital signs reviewed and stable Respiratory status: spontaneous breathing, nonlabored ventilation and respiratory function stable Cardiovascular status: blood pressure returned to baseline and stable Postop Assessment: no apparent nausea or vomiting, adequate PO intake and able to ambulate Anesthetic complications: no   No complications documented.  Last Vitals:  Vitals:   01/20/20 1245 01/20/20 1313  BP: 105/85 127/81  Pulse: 63 62  Resp: 14 14  Temp:  36.9 C  SpO2: 96% 98%    Last Pain:  Vitals:   01/20/20 1313  TempSrc:   PainSc: 4                  Javoris Star,E. Lasharn Bufkin

## 2020-01-21 ENCOUNTER — Telehealth: Payer: Self-pay | Admitting: General Surgery

## 2020-01-21 ENCOUNTER — Encounter (HOSPITAL_BASED_OUTPATIENT_CLINIC_OR_DEPARTMENT_OTHER): Payer: Self-pay | Admitting: General Surgery

## 2020-01-21 NOTE — Telephone Encounter (Signed)
Discussed pathology with patient.  Margins negative.  1/4 nodes positive.  Have forwarded to oncology.

## 2020-01-22 ENCOUNTER — Encounter: Payer: Self-pay | Admitting: *Deleted

## 2020-01-27 ENCOUNTER — Encounter: Payer: Self-pay | Admitting: *Deleted

## 2020-01-27 ENCOUNTER — Telehealth: Payer: Self-pay | Admitting: General Surgery

## 2020-01-27 NOTE — Telephone Encounter (Signed)
Discussed path with patient.  Margins negative.  1/4 nodes positive.  Will forward to care team for additional testing.

## 2020-01-28 LAB — SURGICAL PATHOLOGY

## 2020-01-29 ENCOUNTER — Telehealth: Payer: Self-pay | Admitting: *Deleted

## 2020-01-29 ENCOUNTER — Encounter: Payer: Self-pay | Admitting: *Deleted

## 2020-01-29 NOTE — Telephone Encounter (Signed)
Her2 by FISH is negative. Ordered mammaprint per Dr. Burr Medico. Faxed requisition to pathology and [facility].

## 2020-02-06 ENCOUNTER — Telehealth: Payer: Self-pay | Admitting: *Deleted

## 2020-02-06 DIAGNOSIS — C50211 Malignant neoplasm of upper-inner quadrant of right female breast: Secondary | ICD-10-CM

## 2020-02-06 NOTE — Telephone Encounter (Signed)
Called pt with Mammaprint results of LOW RISK. Informed chemo not recommended and next step is XRT with Dr. Sondra Come. Received verbal understanding. Denies further questions at this time. Informed pt will receive call with an appt to see Dr. Sondra Come.  Referral placed for XRT with Dr. Sondra Come

## 2020-02-09 ENCOUNTER — Encounter: Payer: Self-pay | Admitting: Hematology

## 2020-02-10 ENCOUNTER — Encounter (HOSPITAL_COMMUNITY): Payer: Self-pay | Admitting: Hematology

## 2020-02-12 ENCOUNTER — Encounter: Payer: Self-pay | Admitting: *Deleted

## 2020-02-12 ENCOUNTER — Encounter: Payer: Self-pay | Admitting: Physical Therapy

## 2020-02-12 ENCOUNTER — Other Ambulatory Visit: Payer: Self-pay

## 2020-02-12 ENCOUNTER — Ambulatory Visit: Payer: 59 | Attending: General Surgery | Admitting: Physical Therapy

## 2020-02-12 DIAGNOSIS — R293 Abnormal posture: Secondary | ICD-10-CM | POA: Diagnosis present

## 2020-02-12 DIAGNOSIS — C50211 Malignant neoplasm of upper-inner quadrant of right female breast: Secondary | ICD-10-CM | POA: Diagnosis not present

## 2020-02-12 DIAGNOSIS — M25611 Stiffness of right shoulder, not elsewhere classified: Secondary | ICD-10-CM | POA: Diagnosis present

## 2020-02-12 DIAGNOSIS — Z483 Aftercare following surgery for neoplasm: Secondary | ICD-10-CM | POA: Insufficient documentation

## 2020-02-12 DIAGNOSIS — Z170 Estrogen receptor positive status [ER+]: Secondary | ICD-10-CM | POA: Insufficient documentation

## 2020-02-12 NOTE — Therapy (Signed)
[place], [place], [address] Phone: [phone]   Fax:  [phone]  Physical Therapy Treatment  Patient Details  Name: Katherine Hanna MRN: [id number] Date of Birth: 10-02-1955 Referring Provider (PT): Dr. Stark Klein   Encounter Date: 02/12/2020   PT End of Session - 02/12/20 0928    Visit Number 2    Number of Visits 2    PT Start Time 0853    PT Stop Time 0930    PT Time Calculation (min) 37 min    Activity Tolerance Patient tolerated treatment well    Behavior During Therapy [facility] for tasks assessed/performed           Past Medical History:  Diagnosis Date   Allergic rhinitis    Arthritis    knee   Asthma    Breast cancer ([place])    History of chicken pox    Migraine    Rheumatic fever    as a child.    Past Surgical History:  Procedure Laterality Date   APPENDECTOMY  11/1971   BREAST LUMPECTOMY WITH RADIOACTIVE SEED AND SENTINEL LYMPH NODE BIOPSY Right 01/20/2020   Procedure: RIGHT BREAST LUMPECTOMY WITH RADIOACTIVE SEED AND SENTINEL LYMPH NODE BIOPSY;  Surgeon: Stark Klein, MD;  Location: [facility];  Service: General;  Laterality: Right;   COLONOSCOPY     TONSILLECTOMY     when she was 2 or 3    There were no vitals filed for this visit.   Subjective Assessment - 02/12/20 0900    Subjective Patient underwent a right lumpectomy and sentinel node biopsy on 01/20/2020 with 1/4 positive axillary lymph nodes. Mammaprint test was low so no chemo is needed. She will have radiation simulation on 02/18/2020.    Pertinent History Patient was diagnosed on 11/10/2019 with right grade II invasive ductal carcinoma breast cancer. Patient underwent a right lumpectomy and sentinel node biopsy on 01/20/2020 with 1/4 positive axillary lymph nodes. It is ER/PR positive and HER2 negative with a Ki67 of 10%.    Patient Stated Goals See if my arm is doing ok and improve shoulder  motion    Currently in Pain? Yes    Pain Score 4     Pain Location Chest    Pain Orientation Right    Pain Descriptors / Indicators Sore    Pain Type Surgical pain    Pain Onset 1 to 4 weeks ago    Pain Frequency Intermittent    Aggravating Factors  Using arm alot    Pain Relieving Factors Resting              [facility] PT Assessment - 02/12/20 0001      Assessment   Medical Diagnosis s/p right lumpectomy and SLNB    Referring Provider (PT) Dr. Stark Klein    Onset Date/Surgical Date 01/20/20    Hand Dominance Right    Prior Therapy Baselines      Precautions   Precautions Other (comment)    Precaution Comments recent surgery      Restrictions   Weight Bearing Restrictions No      Balance Screen   Has the patient fallen in the past 6 months No    Has the patient had a decrease in activity level because of a fear of falling?  No    Is the patient reluctant to leave their home because of a fear of falling?  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 There is moderate edema present from a seroma in her right breast. She is wearing a compression bra but it is not full coverage. mild tenderness present in breast.      Posture/Postural Control   Posture/Postural Control Postural limitations    Postural Limitations Rounded Shoulders;Forward head      ROM / Strength   AROM / PROM / Strength AROM      AROM   AROM Assessment Site Shoulder    Right/Left Shoulder Right    Right Shoulder Extension  52 Degrees    Right Shoulder Flexion 148 Degrees    Right Shoulder ABduction 148 Degrees    Right Shoulder Internal Rotation 62 Degrees    Right Shoulder External Rotation 84 Degrees      Strength   Overall Strength Within functional limits for tasks performed             LYMPHEDEMA/ONCOLOGY QUESTIONNAIRE - 02/12/20 0001      Type   Cancer Type Right breast cancer      Surgeries   Lumpectomy Date 01/20/20    Sentinel Lymph Node Biopsy Date 01/20/20    Number Lymph Nodes Removed 4      Treatment   Active Chemotherapy Treatment No    Past Chemotherapy Treatment No    Active Radiation Treatment No    Past Radiation Treatment No    Current Hormone Treatment No    Past Hormone Therapy No      What other symptoms do you have   Are you Having Heaviness or Tightness Yes    Are you having Pain Yes    Are you having pitting edema No    Is it Hard or Difficult finding clothes that fit No    Do you have infections No    Is there Decreased scar mobility Yes    Stemmer Sign No      Lymphedema Assessments   Lymphedema Assessments Upper extremities      Right Upper Extremity Lymphedema   10 cm Proximal to Olecranon Process 31.6 cm    Olecranon Process 26.1 cm    10 cm Proximal to Ulnar Styloid Process 23.3 cm    Just Proximal to Ulnar Styloid Process 16.8 cm    Across Hand at [employer] 18.4 cm    At [place] of 2nd Digit 6.1 cm      Left Upper Extremity Lymphedema   10 cm Proximal to Olecranon Process 29.9 cm    Olecranon Process 26.3 cm    10 cm Proximal to Ulnar Styloid Process 22.4 cm    Just Proximal to Ulnar Styloid Process 16.3 cm    Across Hand at [employer] 17.9 cm    At [place] of 2nd Digit 6 cm              Quick Dash - 02/12/20 0001    Open a tight or new jar Mild difficulty  Do heavy household chores (wash walls, wash floors) No difficulty    Carry a shopping bag or briefcase No difficulty    Wash your back Mild difficulty    Use a knife to cut  food No difficulty    Recreational activities in which you take some force or impact through your arm, shoulder, or hand (golf, hammering, tennis) Mild difficulty    During the past week, to what extent has your arm, shoulder or hand problem interfered with your normal social activities with family, friends, neighbors, or groups? Not at all    During the past week, to what extent has your arm, shoulder or hand problem limited your work or other regular daily activities Not at all    Arm, shoulder, or hand pain. Mild    Tingling (pins and needles) in your arm, shoulder, or hand None    Difficulty Sleeping No difficulty    DASH Score 9.09 %                          PT Education - 02/12/20 0926    Education Details Closed chain HEP; scar massage; follow up care including getting a full coverage compression bra    Person(s) Educated Patient    Methods Explanation;Demonstration;Handout    Comprehension Verbalized understanding;Returned demonstration               PT Long Term Goals - 02/12/20 0939      PT LONG TERM GOAL #1   Title Patient will demonstrate she has regained full shoulder ROM and function post operatively compared to baselines.    Time 8    Period Weeks    Status Partially Met                 Plan - 02/12/20 7124    Clinical Impression Statement Patient is doing very well s/p right lumpectomy and sentinel node biopsy. She had 1/4 positive axillary lymph nodes so she will undergo axillary and right breast radiation but does not need chemotherapy. Her shoulder ROM is very near baseline measurements, there is no sign of early lymphedema, and her incisions are healing well. She will benefit from a fuller coverage compression bra and was advised on how to purchase that. She plans to attend the ABC class on 02/23/2020 for lymphedema risk reduction education. Otherwise she has no PT needs at this time. She knows to call me if her breast edema persists > 3-4  more weeks so we can intervene.    PT Treatment/Interventions ADLs/Self Care Home Management;Therapeutic exercise;Patient/family education    PT Next Visit Plan D/C    PT Home Exercise Plan Post op shoulder ROM HEP    Consulted and Agree with Plan of Care Patient           Patient will benefit from skilled therapeutic intervention in order to improve the following deficits and impairments:  Postural dysfunction, Decreased range of motion, Decreased knowledge of precautions, Impaired UE functional use, Pain, Increased edema, Increased fascial restricitons  Visit Diagnosis: Malignant neoplasm of upper-inner quadrant of right breast in female, estrogen receptor positive ([place])  Abnormal posture  Aftercare following surgery for neoplasm  Stiffness of right shoulder, not elsewhere classified     Problem List Patient Active Problem List   Diagnosis Date Noted   Malignant neoplasm of upper-inner quadrant of right breast in female, estrogen receptor positive ([place]) 12/08/2019   Seasonal allergies 07/30/2017   Need for diphtheria-tetanus-pertussis (  Tdap) vaccine 07/30/2017   Vitamin D deficiency 06/28/2016   Osteoporosis 06/18/2015   Asthma with bronchitis 06/26/2014   Food allergy 02/28/2012   Depression 10/11/2010   Preventative health care 10/05/2010   Screening for malignant neoplasm of the cervix 10/05/2010   COMMON MIGRAINE 08/31/2009   ALLERGIC RHINITIS 08/31/2009   CERVICAL POLYP 08/31/2009   ARTHRITIS 08/31/2009   POSTMENOPAUSAL STATUS 08/31/2009  PHYSICAL THERAPY DISCHARGE SUMMARY  Visits from Start of Care: 2  Current functional level related to goals / functional outcomes: See above for objective findings.   Remaining deficits: Shoulder ROM mild deficits compared to baselines. Moderate right breast edema.   Education / Equipment: HEP and lymphedema risk reduction.  Plan: Patient agrees to discharge.  Patient goals were partially met. Patient is  being discharged due to being pleased with the current functional level.  ?????         Annia Friendly, [place] 02/12/20 9:40 AM   [place], [place], [address] Phone: [phone]   Fax:  [phone]  Name: Katherine Hanna MRN: [id number] Date of Birth: 1956-02-21

## 2020-02-12 NOTE — Patient Instructions (Signed)
Closed Chain: Shoulder Flexion / Extension - on Wall    Hands on wall, step backward. Return. Stepping causes shoulder flexion and extension Do __5_ times, holding 5 seconds, _2__ times per day.  http://ss.exer.us/265   Copyright  VHI. All rights reserved.      Copyright  VHI. All rights reserved.  Closed Chain: Shoulder Abduction / Adduction - on Wall    One hand on wall, step to side and return. Stepping causes shoulder to abduct and adduct. Step _5__ times, holding 5 seconds, __2_ times per day.  http://ss.exer.us/267   Copyright  VHI. All rights reserved.              [facility]         [address], [place] [address]         [phone]         Annia Friendly, PT, CLT   After Breast Cancer Class It is recommended you attend the ABC class to be educated on lymphedema risk reduction. This class is free of charge and lasts for 1 hour. It is a 1-time class.  You are scheduled for October 4th at 11:00. We will send you a link.  Scar massage You can begin gentle scar massage to the right breast incision and armpit incision with your vitamin E cream.   Compression garment It would be good to get a compression bra that comes up higher on your chest. The [facility] is a good one.   Home exercise Program Continue doing your exercises until you can reach all directions without feeling any tightness.   Follow up PT: It is recommended you return every 3 months for the first 3 years following surgery to be assessed on the SOZO machine for an L-Dex score. This helps prevent clinically significant lymphedema in 95% of patients. These follow up screens are 15 minute appointments that you are not billed for. You are scheduled for 04/12/2020 at 8:00 with [place] for this test.

## 2020-02-13 NOTE — Progress Notes (Signed)
Patient here for a consult with Dr. Sondra Come.  Name: [name]            MRN: [id number]         Date: 12/10/2019                      DOB: Jul 06, 1955   WU:[id number] Chase, Alferd Apa, DO  Stark Klein, MD    REFERRING PHYSICIAN: Stark Klein, MD   DIAGNOSIS: The encounter diagnosis was Malignant neoplasm of upper-inner quadrant of right breast in female, estrogen receptor positive ([place]).   Stage T1c, Nx, Mx, Right Breast UIQ, Invasive Ductal Carcinoma with DCIS, ER+ / PR+ / Her2-, Grade 2       ICD-10-CM    1. Malignant neoplasm of upper-inner quadrant of right breast in female, estrogen receptor positive ([place])  C50.211      Z17.0        HISTORY OF PRESENT ILLNESS::Katherine Hanna is a 64 y.o. female who is presenting to the office today for evaluation of her newly diagnosed breast cancer. She is accompanied by her husband. She is doing well overall.    She had routine screening mammography on 11/10/2019 that showed an indeterminate architectural distortion in the right breast. She underwent unilateral diagnostic mammography with tomography and right breast ultrasonography at [facility] on 11/25/2019 that showed a 1.1 x 0.8 x 0.9 cm irregular mass in the right breast at the 3 o'clock position that was highly suggestive of malignancy.   Biopsy on 12/03/2019 revealed grade 2 invasive ductal carcinoma with ductal carcinoma in-situ. Prognostic indicators were significant for estrogen receptor, 95% positive and progesterone receptor, 80% positive, both with strong staining intensities. Proliferation marker Ki67 at 10%. HER2 negative.   Menarche: 65 years old Age at first live birth: 64 years old GP: 2 LMP: 2001 Contraceptive: Yes, from [place] HRT: No    The patient was referred today for presentation in the multidisciplinary conference.  Radiology studies and pathology slides were presented there for review and discussion   IMPRESSION: Stage T1c, Nx, Mx, Right Breast UIQ, Invasive Ductal  Carcinoma with DCIS, ER+ / PR+ / Her2-, Grade 2   Patient will be a good candidate for breast conservation with radiotherapy to the right breast. We discussed the general course of radiation, potential side effects, and toxicities with radiation and the patient is interested in this approach.  01/20/2020 Right Breast Radioactive seed localized lumpectomy and sentinel lymph node biopsy, mastopexy   Indications: This patient presents with history of right breast cancer, upper inner quadrant, cT1cN0M0 grade 2 invasive ductal with DCIS, +/+/-  Pre-operative Diagnosis: right breast cancer   Post-operative Diagnosis: Same   Surgeon: Stark Klein   FINAL MICROSCOPIC DIAGNOSIS:   A. BREAST, RIGHT, LUMPECTOMY:  - Invasive ductal carcinoma, grade 2, spanning 1.3 cm.  - Intermediate grade ductal carcinoma in situ.  - Invasive carcinoma is <1 mm from the lateral margin focally.  - In situ carcinoma is 3 mm from the lateral margin focally.  - Biopsy site.  - Fibrocystic change and usual ductal hyperplasia.  - See oncology table.   B. LYMPH NODE, RIGHT AXILLARY #1, SENTINEL, EXCISION:  - Metastatic carcinoma in one of one lymph nodes (1/1).  - Focal extracapsular extension.   C. LYMPH NODE, RIGHT AXILLARY #2, SENTINEL, EXCISION:  - One of one lymph nodes negative for carcinoma (0/1).   D. LYMPH NODE, RIGHT AXILLARY #3, SENTINEL, EXCISION:  - One of one lymph  nodes negative for carcinoma (0/1).   E. LYMPH NODE, RIGHT AXILLARY #4, SENTINEL, EXCISION:  - One of one lymph nodes negative for carcinoma (0/1).    Past/Anticipated interventions by medical oncology, if any: no  Lymphedema issues, if any: no  Pain issues, if any:  right breast sore.  SAFETY ISSUES: Prior radiation? no Pacemaker/ICD? no Possible current pregnancy?postmenopausal Is the patient on methotrexate? no  Current Complaints / other details:  wearing a compression bra due to extra fluid in right breast    T 97.8 P  79 R 18 sat 97% 149/82    Wt Readings from Last 3 Encounters:  02/19/20 179 lb 8 oz (81.4 kg)  01/20/20 176 lb 12.9 oz (80.2 kg)  12/10/19 177 lb 9.6 oz (80.6 kg)      De Burrs, RN 02/13/2020,2:34 PM

## 2020-02-18 ENCOUNTER — Ambulatory Visit: Payer: 59 | Admitting: Radiation Oncology

## 2020-02-18 ENCOUNTER — Ambulatory Visit: Payer: 59

## 2020-02-18 ENCOUNTER — Ambulatory Visit
Admission: RE | Admit: 2020-02-18 | Discharge: 2020-02-18 | Disposition: A | Discharge status: Home / Self Care | Payer: 59 | Source: Ambulatory Visit | Attending: Radiation Oncology | Admitting: Radiation Oncology

## 2020-02-18 NOTE — Progress Notes (Signed)
Radiation Oncology         [phone] ________________________________  Name: Katherine Hanna MRN: [id number]  Date: 02/19/2020  DOB: Aug 04, 1955  Re-Evaluation Note  CC: Carollee Herter, Alferd Apa, DO  Truitt Merle, MD    ICD-10-CM   1. Malignant neoplasm of upper-inner quadrant of right breast in female, estrogen receptor positive ([place])  C50.211    Z17.0     Diagnosis:  Stage IA (pT1c, pN1a) Right Breast UIQ, Invasive Ductal Carcinoma with DCIS, ER+ / PR+ / Her2-, Grade 2  Narrative:  The patient returns today to discuss radiation treatment options. She was seen in the multidisciplinary breast clinic on 12/10/2019. At that time, it was recommended that she proceed with right lumpectomy with sentinel lymph node biopsy, oncotype Dx, adjuvant radiation therapy, and aromatase inhibitor.  Since consultation, she underwent a right breast lumpectomy and sentinel lymph node biopsy on 01/20/2020 that was performed by Dr. Barry Dienes. Pathology from the procedure revealed grade 2 invasive ductal carcinoma with intermediate grade ductal carcinoma in situ. The invasive carcinoma was <1 mm from the lateral margin focally and the in situ carcinoma was 3 mm from the lateral margin focally. Four right axillary sentinel lymph nodes were excised, one of which was positive for metastatic carcinoma and focal extracapsular extension.  MammaPrint was obtained on the final surgical sample and the MPI of +03.11 predicts a low risk of recurrence (10%) over the next 10 years if untreated. No potential significant chemotherapy benefit was predicted.  On review of systems, the patient reports mild discomfort in the breast but no pain.  She denies any problems with swelling in her right arm or hand.  She has seen Dr. Barry Dienes since her surgery.  Patient was noted to have some fluid in the breast but aspiration was not recommended..    Allergies:  is allergic to penicillins.  Meds: Current Outpatient Medications  Medication Sig  Dispense Refill  . albuterol (VENTOLIN HFA) 108 (90 BASE) MCG/ACT inhaler Inhale 2 puffs into the lungs every 6 (six) hours as needed. 1 Inhaler 1  . blood glucose meter kit and supplies Check glucose once daily. (Patient not taking: Reported on 08/04/2019) 1 each 0  . Calcium Carbonate-Vitamin D (CALCIUM 500 + D PO) Take 1 tablet by mouth 2 (two) times daily.    Marland Kitchen co-enzyme Q-10 30 MG capsule Take 30 mg by mouth daily.      . fexofenadine (ALLEGRA) 180 MG tablet Take 180 mg by mouth daily.    . Misc Natural Products (TART CHERRY ADVANCED PO) Take by mouth.    . Multiple Vitamin (MULTIVITAMIN) tablet Take 1 tablet by mouth daily.      Marland Kitchen oxyCODONE (OXY IR/ROXICODONE) 5 MG immediate release tablet Take 1 tablet (5 mg total) by mouth every 6 (six) hours as needed for severe pain. 10 tablet 0  . pantoprazole (PROTONIX) 40 MG tablet Take 1 tablet (40 mg total) by mouth daily. 90 tablet 3  . sertraline (ZOLOFT) 50 MG tablet Take 1 tablet (50 mg total) by mouth daily. 30 tablet 3  . TURMERIC PO Take by mouth.     No current facility-administered medications for this encounter.    Physical Findings: The patient is in no acute distress. Patient is alert and oriented.  height is _0  (1.651 m) and weight is 179 lb 8 oz (81.4 kg). Her tympanic temperature is 97.8 F (36.6 C). Her blood pressure is 149/82 (abnormal) and her pulse is 79. Her respiration  is 18 and oxygen saturation is 97%.  No significant changes. Lungs are clear to auscultation bilaterally. Heart has regular rate and rhythm. No palpable cervical, supraclavicular, or axillary adenopathy. Abdomen soft, non-tender, normal bowel sounds. Left breast: no palpable mass, nipple discharge or bleeding. Right breast: Well-healing periareolar scar in the medial aspect of the breast.  There is some erythema noted along the skin of the upper inner quadrant of the breast but no obvious signs of infection.  Patient has a separate scar in the axillary region  from her sentinel node procedure.  Lab Findings: Lab Results  Component Value Date   WBC 8.2 12/10/2019   HGB 14.1 12/10/2019   HCT 43.0 12/10/2019   MCV 93.3 12/10/2019   PLT 303 12/10/2019    Radiographic Findings: NM Sentinel Node Inj-No Rpt (Breast)  Result Date: 01/20/2020 Sulfur colloid was injected by the nuclear medicine technologist for melanoma sentinel node.    Impression:  Stage IA (pT1c, pN1a) Right Breast UIQ, Invasive Ductal Carcinoma with DCIS, ER+ / PR+ / Her2-, Grade 2  I discussed with patient that she was found to have one of her lymph nodes positive for metastatic disease.  Fortunately even with this finding she does not require adjuvant chemotherapy based on MammaPrint findings.  I did discuss with patient that I would recommend elective coverage of the axillary region given his positive lymph node.  She would not be a candidate for hypofractionated accelerated radiation therapy given the necessity of covering the axillary and supraclavicular region.  I discussed the general course of treatment side effects and potential toxicities of radiation therapy with the patient.  She appears to understand and wishes to proceed with treatment  Plan:  Patient is scheduled for CT simulation later today.  Treatments to begin approximately week.  She will receive 6-1/2 weeks of radiation therapy.  First 28 treatments will cover the right breast and axillary area.  She will then proceed with a boost to the lumpectomy cavity of 12 Gy in 6 fractions.  Total time spent in this encounter was 35 minutes which included reviewing the patient's most recent lumpectomy, pathology report, mammaprint, physical examination, and documentation.  -----------------------------------  Blair Promise, PhD, MD  This document serves as a record of services personally performed by Gery Pray, MD. It was created on his behalf by Clerance Lav, a trained medical scribe. The creation of this record  is based on the scribe's personal observations and the provider's statements to them. This document has been checked and approved by the attending provider.

## 2020-02-19 ENCOUNTER — Other Ambulatory Visit: Payer: Self-pay

## 2020-02-19 ENCOUNTER — Ambulatory Visit
Admission: RE | Admit: 2020-02-19 | Discharge: 2020-02-19 | Disposition: A | Discharge status: Home / Self Care | Payer: 59 | Source: Ambulatory Visit | Attending: Radiation Oncology | Admitting: Radiation Oncology

## 2020-02-19 ENCOUNTER — Institutional Professional Consult (permissible substitution): Payer: 59 | Admitting: Radiation Oncology

## 2020-02-19 ENCOUNTER — Encounter: Payer: Self-pay | Admitting: Radiation Oncology

## 2020-02-19 DIAGNOSIS — C50211 Malignant neoplasm of upper-inner quadrant of right female breast: Secondary | ICD-10-CM

## 2020-02-19 DIAGNOSIS — Z170 Estrogen receptor positive status [ER+]: Secondary | ICD-10-CM | POA: Diagnosis not present

## 2020-02-19 DIAGNOSIS — C773 Secondary and unspecified malignant neoplasm of axilla and upper limb lymph nodes: Secondary | ICD-10-CM | POA: Diagnosis not present

## 2020-02-20 ENCOUNTER — Encounter: Payer: Self-pay | Admitting: General Practice

## 2020-02-20 NOTE — Progress Notes (Signed)
[place] Psychosocial Distress Screening Clinical Social Work  Clinical Social Work was referred by distress screening protocol.  The patient scored a 6 on the Psychosocial Distress Thermometer which indicates moderate distress. Clinical Social Worker did not contact patient as she declined social work referral to assess for distress and other psychosocial needs.   ONCBCN DISTRESS SCREENING 02/19/2020  Screening Type Initial Screening  Distress experienced in past week (1-10) 6  Practical problem type   Emotional problem type   Referral to clinical social work No  Referral to support programs   Other declined social work referral    Clinical Social Worker follow up needed: No.  If yes, follow up plan:  Beverely Pace, [place], LCSW Clinical Social Worker Phone:  [phone]

## 2020-02-23 DIAGNOSIS — Z170 Estrogen receptor positive status [ER+]: Secondary | ICD-10-CM | POA: Insufficient documentation

## 2020-02-23 DIAGNOSIS — C50211 Malignant neoplasm of upper-inner quadrant of right female breast: Secondary | ICD-10-CM | POA: Diagnosis not present

## 2020-02-26 ENCOUNTER — Ambulatory Visit
Admission: RE | Admit: 2020-02-26 | Discharge: 2020-02-26 | Disposition: A | Discharge status: Home / Self Care | Payer: 59 | Source: Ambulatory Visit | Attending: Radiation Oncology | Admitting: Radiation Oncology

## 2020-02-26 ENCOUNTER — Other Ambulatory Visit: Payer: Self-pay

## 2020-02-26 DIAGNOSIS — C50211 Malignant neoplasm of upper-inner quadrant of right female breast: Secondary | ICD-10-CM | POA: Diagnosis not present

## 2020-02-26 DIAGNOSIS — Z170 Estrogen receptor positive status [ER+]: Secondary | ICD-10-CM

## 2020-02-27 ENCOUNTER — Other Ambulatory Visit: Payer: Self-pay

## 2020-02-27 ENCOUNTER — Ambulatory Visit
Admission: RE | Admit: 2020-02-27 | Discharge: 2020-02-27 | Disposition: A | Discharge status: Home / Self Care | Payer: 59 | Source: Ambulatory Visit | Attending: Radiation Oncology | Admitting: Radiation Oncology

## 2020-02-27 DIAGNOSIS — C50211 Malignant neoplasm of upper-inner quadrant of right female breast: Secondary | ICD-10-CM | POA: Diagnosis not present

## 2020-03-01 ENCOUNTER — Ambulatory Visit
Admission: RE | Admit: 2020-03-01 | Discharge: 2020-03-01 | Disposition: A | Discharge status: Home / Self Care | Payer: 59 | Source: Ambulatory Visit | Attending: Radiation Oncology | Admitting: Radiation Oncology

## 2020-03-01 DIAGNOSIS — C50211 Malignant neoplasm of upper-inner quadrant of right female breast: Secondary | ICD-10-CM | POA: Diagnosis not present

## 2020-03-02 ENCOUNTER — Ambulatory Visit
Admission: RE | Admit: 2020-03-02 | Discharge: 2020-03-02 | Disposition: A | Discharge status: Home / Self Care | Payer: 59 | Source: Ambulatory Visit | Attending: Radiation Oncology | Admitting: Radiation Oncology

## 2020-03-02 DIAGNOSIS — C50211 Malignant neoplasm of upper-inner quadrant of right female breast: Secondary | ICD-10-CM | POA: Diagnosis not present

## 2020-03-02 DIAGNOSIS — Z170 Estrogen receptor positive status [ER+]: Secondary | ICD-10-CM

## 2020-03-02 MED ORDER — ALRA NON-METALLIC DEODORANT (RAD-ONC)
1.0000 "application " | Freq: Once | TOPICAL | Status: AC
  Administered 2020-03-02: 1 via TOPICAL

## 2020-03-02 MED ORDER — RADIAPLEXRX EX GEL: Freq: Once | CUTANEOUS | Status: AC

## 2020-03-03 ENCOUNTER — Ambulatory Visit
Admission: RE | Admit: 2020-03-03 | Discharge: 2020-03-03 | Disposition: A | Discharge status: Home / Self Care | Payer: 59 | Source: Ambulatory Visit | Attending: Radiation Oncology | Admitting: Radiation Oncology

## 2020-03-03 DIAGNOSIS — C50211 Malignant neoplasm of upper-inner quadrant of right female breast: Secondary | ICD-10-CM | POA: Diagnosis not present

## 2020-03-04 ENCOUNTER — Ambulatory Visit
Admission: RE | Admit: 2020-03-04 | Discharge: 2020-03-04 | Disposition: A | Discharge status: Home / Self Care | Payer: 59 | Source: Ambulatory Visit | Attending: Radiation Oncology | Admitting: Radiation Oncology

## 2020-03-04 DIAGNOSIS — C50211 Malignant neoplasm of upper-inner quadrant of right female breast: Secondary | ICD-10-CM | POA: Diagnosis not present

## 2020-03-05 ENCOUNTER — Ambulatory Visit
Admission: RE | Admit: 2020-03-05 | Discharge: 2020-03-05 | Disposition: A | Discharge status: Home / Self Care | Payer: 59 | Source: Ambulatory Visit | Attending: Radiation Oncology | Admitting: Radiation Oncology

## 2020-03-05 DIAGNOSIS — C50211 Malignant neoplasm of upper-inner quadrant of right female breast: Secondary | ICD-10-CM | POA: Diagnosis not present

## 2020-03-08 ENCOUNTER — Ambulatory Visit
Admission: RE | Admit: 2020-03-08 | Discharge: 2020-03-08 | Disposition: A | Discharge status: Home / Self Care | Payer: 59 | Source: Ambulatory Visit | Attending: Radiation Oncology | Admitting: Radiation Oncology

## 2020-03-08 DIAGNOSIS — C50211 Malignant neoplasm of upper-inner quadrant of right female breast: Secondary | ICD-10-CM | POA: Diagnosis not present

## 2020-03-09 ENCOUNTER — Other Ambulatory Visit: Payer: Self-pay

## 2020-03-09 ENCOUNTER — Telehealth: Payer: Self-pay | Admitting: Hematology

## 2020-03-09 ENCOUNTER — Encounter: Payer: Self-pay | Admitting: *Deleted

## 2020-03-09 ENCOUNTER — Ambulatory Visit
Admission: RE | Admit: 2020-03-09 | Discharge: 2020-03-09 | Disposition: A | Discharge status: Home / Self Care | Payer: 59 | Source: Ambulatory Visit | Attending: Radiation Oncology | Admitting: Radiation Oncology

## 2020-03-09 DIAGNOSIS — C50211 Malignant neoplasm of upper-inner quadrant of right female breast: Secondary | ICD-10-CM | POA: Diagnosis not present

## 2020-03-09 NOTE — Telephone Encounter (Signed)
Scheduled appt per 10/19 sch message - mailed reminder letter with appt date and time

## 2020-03-10 ENCOUNTER — Ambulatory Visit
Admission: RE | Admit: 2020-03-10 | Discharge: 2020-03-10 | Disposition: A | Discharge status: Home / Self Care | Payer: 59 | Source: Ambulatory Visit | Attending: Radiation Oncology | Admitting: Radiation Oncology

## 2020-03-10 DIAGNOSIS — C50211 Malignant neoplasm of upper-inner quadrant of right female breast: Secondary | ICD-10-CM | POA: Diagnosis not present

## 2020-03-11 ENCOUNTER — Ambulatory Visit
Admission: RE | Admit: 2020-03-11 | Discharge: 2020-03-11 | Disposition: A | Discharge status: Home / Self Care | Payer: 59 | Source: Ambulatory Visit | Attending: Radiation Oncology | Admitting: Radiation Oncology

## 2020-03-11 DIAGNOSIS — C50211 Malignant neoplasm of upper-inner quadrant of right female breast: Secondary | ICD-10-CM | POA: Diagnosis not present

## 2020-03-12 ENCOUNTER — Ambulatory Visit
Admission: RE | Admit: 2020-03-12 | Discharge: 2020-03-12 | Disposition: A | Discharge status: Home / Self Care | Payer: 59 | Source: Ambulatory Visit | Attending: Radiation Oncology | Admitting: Radiation Oncology

## 2020-03-12 DIAGNOSIS — C50211 Malignant neoplasm of upper-inner quadrant of right female breast: Secondary | ICD-10-CM | POA: Diagnosis not present

## 2020-03-15 ENCOUNTER — Ambulatory Visit
Admission: RE | Admit: 2020-03-15 | Discharge: 2020-03-15 | Disposition: A | Discharge status: Home / Self Care | Payer: 59 | Source: Ambulatory Visit | Attending: Radiation Oncology | Admitting: Radiation Oncology

## 2020-03-15 ENCOUNTER — Other Ambulatory Visit: Payer: Self-pay

## 2020-03-15 DIAGNOSIS — C50211 Malignant neoplasm of upper-inner quadrant of right female breast: Secondary | ICD-10-CM | POA: Diagnosis not present

## 2020-03-16 ENCOUNTER — Ambulatory Visit
Admission: RE | Admit: 2020-03-16 | Discharge: 2020-03-16 | Disposition: A | Discharge status: Home / Self Care | Payer: 59 | Source: Ambulatory Visit | Attending: Radiation Oncology | Admitting: Radiation Oncology

## 2020-03-16 DIAGNOSIS — C50211 Malignant neoplasm of upper-inner quadrant of right female breast: Secondary | ICD-10-CM | POA: Diagnosis not present

## 2020-03-17 ENCOUNTER — Ambulatory Visit
Admission: RE | Admit: 2020-03-17 | Discharge: 2020-03-17 | Disposition: A | Discharge status: Home / Self Care | Payer: 59 | Source: Ambulatory Visit | Attending: Radiation Oncology | Admitting: Radiation Oncology

## 2020-03-17 ENCOUNTER — Other Ambulatory Visit: Payer: Self-pay

## 2020-03-17 DIAGNOSIS — C50211 Malignant neoplasm of upper-inner quadrant of right female breast: Secondary | ICD-10-CM | POA: Diagnosis not present

## 2020-03-18 ENCOUNTER — Ambulatory Visit
Admission: RE | Admit: 2020-03-18 | Discharge: 2020-03-18 | Disposition: A | Discharge status: Home / Self Care | Payer: 59 | Source: Ambulatory Visit | Attending: Radiation Oncology | Admitting: Radiation Oncology

## 2020-03-18 DIAGNOSIS — C50211 Malignant neoplasm of upper-inner quadrant of right female breast: Secondary | ICD-10-CM | POA: Diagnosis not present

## 2020-03-19 ENCOUNTER — Ambulatory Visit
Admission: RE | Admit: 2020-03-19 | Discharge: 2020-03-19 | Disposition: A | Discharge status: Home / Self Care | Payer: 59 | Source: Ambulatory Visit | Attending: Radiation Oncology | Admitting: Radiation Oncology

## 2020-03-19 DIAGNOSIS — C50211 Malignant neoplasm of upper-inner quadrant of right female breast: Secondary | ICD-10-CM | POA: Diagnosis not present

## 2020-03-22 ENCOUNTER — Other Ambulatory Visit: Payer: Self-pay

## 2020-03-22 ENCOUNTER — Ambulatory Visit
Admission: RE | Admit: 2020-03-22 | Discharge: 2020-03-22 | Disposition: A | Discharge status: Home / Self Care | Payer: 59 | Source: Ambulatory Visit | Attending: Radiation Oncology | Admitting: Radiation Oncology

## 2020-03-22 DIAGNOSIS — Z79811 Long term (current) use of aromatase inhibitors: Secondary | ICD-10-CM | POA: Diagnosis not present

## 2020-03-22 DIAGNOSIS — Z510 Encounter for antineoplastic radiation therapy: Secondary | ICD-10-CM | POA: Diagnosis present

## 2020-03-22 DIAGNOSIS — Z170 Estrogen receptor positive status [ER+]: Secondary | ICD-10-CM | POA: Diagnosis not present

## 2020-03-22 DIAGNOSIS — C50211 Malignant neoplasm of upper-inner quadrant of right female breast: Secondary | ICD-10-CM | POA: Insufficient documentation

## 2020-03-23 ENCOUNTER — Other Ambulatory Visit: Payer: Self-pay

## 2020-03-23 ENCOUNTER — Ambulatory Visit
Admission: RE | Admit: 2020-03-23 | Discharge: 2020-03-23 | Disposition: A | Discharge status: Home / Self Care | Payer: 59 | Source: Ambulatory Visit | Attending: Radiation Oncology | Admitting: Radiation Oncology

## 2020-03-23 DIAGNOSIS — Z510 Encounter for antineoplastic radiation therapy: Secondary | ICD-10-CM | POA: Diagnosis not present

## 2020-03-24 ENCOUNTER — Ambulatory Visit
Admission: RE | Admit: 2020-03-24 | Discharge: 2020-03-24 | Disposition: A | Discharge status: Home / Self Care | Payer: 59 | Source: Ambulatory Visit | Attending: Radiation Oncology | Admitting: Radiation Oncology

## 2020-03-24 DIAGNOSIS — Z170 Estrogen receptor positive status [ER+]: Secondary | ICD-10-CM

## 2020-03-24 DIAGNOSIS — C50211 Malignant neoplasm of upper-inner quadrant of right female breast: Secondary | ICD-10-CM

## 2020-03-24 DIAGNOSIS — Z79811 Long term (current) use of aromatase inhibitors: Secondary | ICD-10-CM | POA: Insufficient documentation

## 2020-03-24 DIAGNOSIS — Z510 Encounter for antineoplastic radiation therapy: Secondary | ICD-10-CM | POA: Diagnosis not present

## 2020-03-24 MED ORDER — ALRA NON-METALLIC DEODORANT (RAD-ONC)
1.0000 "application " | Freq: Once | TOPICAL | Status: AC
  Administered 2020-03-24: 1 via TOPICAL

## 2020-03-24 MED ORDER — RADIAPLEXRX EX GEL: Freq: Once | CUTANEOUS | Status: AC

## 2020-03-25 ENCOUNTER — Ambulatory Visit
Admission: RE | Admit: 2020-03-25 | Discharge: 2020-03-25 | Disposition: A | Discharge status: Home / Self Care | Payer: 59 | Source: Ambulatory Visit | Attending: Radiation Oncology | Admitting: Radiation Oncology

## 2020-03-25 DIAGNOSIS — Z510 Encounter for antineoplastic radiation therapy: Secondary | ICD-10-CM | POA: Diagnosis not present

## 2020-03-26 ENCOUNTER — Ambulatory Visit
Admission: RE | Admit: 2020-03-26 | Discharge: 2020-03-26 | Disposition: A | Discharge status: Home / Self Care | Payer: 59 | Source: Ambulatory Visit | Attending: Radiation Oncology | Admitting: Radiation Oncology

## 2020-03-26 DIAGNOSIS — Z510 Encounter for antineoplastic radiation therapy: Secondary | ICD-10-CM | POA: Diagnosis not present

## 2020-03-29 ENCOUNTER — Other Ambulatory Visit: Payer: Self-pay

## 2020-03-29 ENCOUNTER — Ambulatory Visit
Admission: RE | Admit: 2020-03-29 | Discharge: 2020-03-29 | Disposition: A | Discharge status: Home / Self Care | Payer: 59 | Source: Ambulatory Visit | Attending: Radiation Oncology | Admitting: Radiation Oncology

## 2020-03-29 DIAGNOSIS — Z510 Encounter for antineoplastic radiation therapy: Secondary | ICD-10-CM | POA: Diagnosis not present

## 2020-03-30 ENCOUNTER — Ambulatory Visit
Admission: RE | Admit: 2020-03-30 | Discharge: 2020-03-30 | Disposition: A | Discharge status: Home / Self Care | Payer: 59 | Source: Ambulatory Visit | Attending: Radiation Oncology | Admitting: Radiation Oncology

## 2020-03-30 ENCOUNTER — Ambulatory Visit: Payer: 59 | Admitting: Radiation Oncology

## 2020-03-30 DIAGNOSIS — Z510 Encounter for antineoplastic radiation therapy: Secondary | ICD-10-CM | POA: Diagnosis not present

## 2020-03-31 ENCOUNTER — Ambulatory Visit
Admission: RE | Admit: 2020-03-31 | Discharge: 2020-03-31 | Disposition: A | Discharge status: Home / Self Care | Payer: 59 | Source: Ambulatory Visit | Attending: Radiation Oncology | Admitting: Radiation Oncology

## 2020-03-31 DIAGNOSIS — Z510 Encounter for antineoplastic radiation therapy: Secondary | ICD-10-CM | POA: Diagnosis not present

## 2020-04-01 ENCOUNTER — Ambulatory Visit: Payer: 59 | Admitting: Radiation Oncology

## 2020-04-01 ENCOUNTER — Ambulatory Visit
Admission: RE | Admit: 2020-04-01 | Discharge: 2020-04-01 | Disposition: A | Discharge status: Home / Self Care | Payer: 59 | Source: Ambulatory Visit | Attending: Radiation Oncology | Admitting: Radiation Oncology

## 2020-04-01 DIAGNOSIS — Z510 Encounter for antineoplastic radiation therapy: Secondary | ICD-10-CM | POA: Diagnosis not present

## 2020-04-02 ENCOUNTER — Ambulatory Visit
Admission: RE | Admit: 2020-04-02 | Discharge: 2020-04-02 | Disposition: A | Discharge status: Home / Self Care | Payer: 59 | Source: Ambulatory Visit | Attending: Radiation Oncology | Admitting: Radiation Oncology

## 2020-04-02 DIAGNOSIS — Z510 Encounter for antineoplastic radiation therapy: Secondary | ICD-10-CM | POA: Diagnosis not present

## 2020-04-02 NOTE — Progress Notes (Signed)
[facility]   Telephone:([phone] Fax:([phone]   Clinic Follow up Note   Patient Care Team: [name], [name], DO as PCP - General (Family Medicine) Mardella Layman, MD as Consulting Physician (Gastroenterology) Hollar, Ronal Fear, MD as Referring Physician (Dermatology) [name], OD as Consulting Physician (Optometry) Pershing Proud, RN as Oncology Nurse Navigator Donnelly Angelica, RN as Oncology Nurse Navigator Almond Lint, MD as Consulting Physician (General Surgery) [name], MD as Consulting Physician (Hematology) Antony Blackbird, MD as Consulting Physician (Radiation Oncology)  Date of Service:  04/07/2020  CHIEF COMPLAINT: F/u of right breast cancer  SUMMARY OF ONCOLOGIC HISTORY: Oncology History Overview Note  Cancer Staging Malignant neoplasm of upper-inner quadrant of right breast in female, estrogen receptor positive (HCC) Staging form: Breast, AJCC 8th Edition - Clinical stage from 12/03/2019: Stage IA (cT1c, cN0, cM0, G2, ER+, PR+, HER2-) - Signed by [name], MD on 12/09/2019 - Pathologic stage from 01/20/2020: Stage IA (pT1c, pN1a, cM0, G2, ER+, PR+, HER2-) - Signed by [name], MD on 04/06/2020    Malignant neoplasm of upper-inner quadrant of right breast in female, estrogen receptor positive (HCC)  11/25/2019 Mammogram   Mammogram and [place] 11/25/19  IMPRESSION The 1.1x0.8x0.9cm architectural distortion in the right breast at 3:00 position middle depth 4 cm from nipple is highly suggestive of malignancy. An US guided biopsy is recommended.     12/03/2019 Initial Biopsy   Diagnosis 12/03/19 Breast, right, needle core biopsy, 3 o'clock, 4 cmfn - INVASIVE DUCTAL CARCINOMA. - DUCTAL CARCINOMA IN SITU. Microscopic Comment The carcinoma appears grade 2. The greatest linear extent of tumor in any one core is 10 mm. Ancillary studies will be reported separately. Results reported to [employer] on 12/04/2019.  Intradepartmental consultation (Dr. Charm Barges).   12/03/2019 Receptors her2   PROGNOSTIC INDICATORS Results: IMMUNOHISTOCHEMICAL AND MORPHOMETRIC ANALYSIS PERFORMED MANUALLY The tumor cells are EQUIVOCAL for Her2 (2+). Her2 by FISH will be performed and results reported separately. Estrogen Receptor: 95%, POSITIVE, STRONG STAINING INTENSITY Progesterone Receptor: 80%, POSITIVE, STRONG STAINING INTENSITY Proliferation Marker Ki67: 10%   FLUORESCENCE IN-SITU HYBRIDIZATION Results: GROUP 5: HER2 **NEGATIVE** Equivocal form of amplification of the HER2 gene was detected in the IHC 2+ tissue sample received from this individual. HER2 FISH was performed by a technologist and cell imaging and analysis on the BioView.   12/03/2019 Cancer Staging   Staging form: Breast, AJCC 8th Edition - Clinical stage from 12/03/2019: Stage IA (cT1c, cN0, cM0, G2, ER+, PR+, HER2-) - Signed by [name], MD on 12/09/2019   12/08/2019 Initial Diagnosis   Malignant neoplasm of upper-inner quadrant of right breast in female, estrogen receptor positive (HCC)   01/20/2020 Surgery   RIGHT BREAST LUMPECTOMY WITH RADIOACTIVE SEED AND SENTINEL LYMPH NODE BIOPSY by Donell Beers    01/20/2020 Pathology Results   FINAL MICROSCOPIC DIAGNOSIS:   A. BREAST, RIGHT, LUMPECTOMY:  - Invasive ductal carcinoma, grade 2, spanning 1.3 cm.  - Intermediate grade ductal carcinoma in situ.  - Invasive carcinoma is <1 mm from the lateral margin focally.  - In situ carcinoma is 3 mm from the lateral margin focally.  - Biopsy site.  - Fibrocystic change and usual ductal hyperplasia.  - See oncology table.   B. LYMPH NODE, RIGHT AXILLARY #1, SENTINEL, EXCISION:  - Metastatic carcinoma in one of one lymph nodes (1/1).  - Focal extracapsular extension.   C. LYMPH NODE, RIGHT AXILLARY #2, SENTINEL, EXCISION:  - One of one lymph  nodes negative for carcinoma (0/1).   D. LYMPH NODE, RIGHT AXILLARY #3, SENTINEL, EXCISION:  - One of one lymph  nodes negative for carcinoma (0/1).   E. LYMPH NODE, RIGHT AXILLARY #4, SENTINEL, EXCISION:  - One of one lymph nodes negative for carcinoma (0/1).     ADDENDUM:   By immunohistochemistry, HER-2 is EQUIVOCAL (2+).  HER-2 by FISH is  pending and will be reported in an addendum.   ADDENDUM:   FLOURESCENCE IN-SITU HYBRIDIZATION RESULTS:   GROUP 5:   HER2 **NEGATIVE**    01/20/2020 Miscellaneous   Mammaprint Low risk Luminal Type A - MPI at +0.311 She has 97.8% benefit of Horomal Therpay alone    01/20/2020 Cancer Staging   Staging form: Breast, AJCC 8th Edition - Pathologic stage from 01/20/2020: Stage IA (pT1c, pN1a, cM0, G2, ER+, PR+, HER2-) - Signed by Truitt Merle, MD on 04/06/2020   02/26/2020 - 04/13/2020 Radiation Therapy   Adjuvant Radiation with Dr Sondra Come    04/2020 -  Anti-estrogen oral therapy   Anastrozole $RemoveBefo'1mg'oOzcUbeWBqT$  daily starting in 04/2020       CURRENT THERAPY:  Adjuvant Radiation with Dr Sondra Come 02/26/20-04/13/20.  Anastrozole $RemoveBefo'1mg'DAcZdizElGO$  daily starting 04/2020  INTERVAL HISTORY:  Katherine Hanna is here for a follow up. She presents to the clinic alone. She notes she is tolerating RT well but has more skin redness with blistering. She has been using topical treatment with Dr Sondra Come on this. She notes she feels tired by the end of the day. She notes her arthritis is manageable and has pain occasionally. She will use Tylenol as needed. She note she plans to retire end of 2021 and will switch to Medicare.     REVIEW OF SYSTEMS:   Constitutional: Denies fevers, chills or abnormal weight loss Eyes: Denies blurriness of vision Ears, nose, mouth, throat, and face: Denies mucositis or sore throat Respiratory: Denies cough, dyspnea or wheezes Cardiovascular: Denies palpitation, chest discomfort or lower extremity swelling Gastrointestinal:  Denies nausea, heartburn or change in bowel habits Skin: Denies abnormal skin rashes Lymphatics: Denies new lymphadenopathy or easy  bruising Neurological:Denies numbness, tingling or new weaknesses Behavioral/Psych: Mood is stable, no new changes  All other systems were reviewed with the patient and are negative.  MEDICAL HISTORY:  Past Medical History:  Diagnosis Date   Allergic rhinitis    Arthritis    knee   Asthma    Breast cancer ([place])    History of chicken pox    Migraine    Rheumatic fever    as a child.    SURGICAL HISTORY: Past Surgical History:  Procedure Laterality Date   APPENDECTOMY  11/1971   BREAST LUMPECTOMY WITH RADIOACTIVE SEED AND SENTINEL LYMPH NODE BIOPSY Right 01/20/2020   Procedure: RIGHT BREAST LUMPECTOMY WITH RADIOACTIVE SEED AND SENTINEL LYMPH NODE BIOPSY;  Surgeon: Stark Klein, MD;  Location: [facility];  Service: General;  Laterality: Right;   COLONOSCOPY     TONSILLECTOMY     when she was 2 or 3    I have reviewed the social history and family history with the patient and they are unchanged from previous note.  ALLERGIES:  is allergic to penicillins.  MEDICATIONS:  Current Outpatient Medications  Medication Sig Dispense Refill   albuterol (VENTOLIN HFA) 108 (90 BASE) MCG/ACT inhaler Inhale 2 puffs into the lungs every 6 (six) hours as needed. 1 Inhaler 1   anastrozole (ARIMIDEX) 1 MG tablet Take 1 tablet (1 mg total) by mouth daily.  30 tablet 3   blood glucose meter kit and supplies Check glucose once daily. (Patient not taking: Reported on 08/04/2019) 1 each 0   Calcium Carbonate-Vitamin D (CALCIUM 500 + D PO) Take 1 tablet by mouth 2 (two) times daily.     co-enzyme Q-10 30 MG capsule Take 30 mg by mouth daily.       fexofenadine (ALLEGRA) 180 MG tablet Take 180 mg by mouth daily.     Misc Natural Products (TART CHERRY ADVANCED PO) Take by mouth.     Multiple Vitamin (MULTIVITAMIN) tablet Take 1 tablet by mouth daily.       oxyCODONE (OXY IR/ROXICODONE) 5 MG immediate release tablet Take 1 tablet (5 mg total) by mouth every 6 (six)  hours as needed for severe pain. 10 tablet 0   pantoprazole (PROTONIX) 40 MG tablet Take 1 tablet (40 mg total) by mouth daily. 90 tablet 3   sertraline (ZOLOFT) 50 MG tablet Take 1 tablet (50 mg total) by mouth daily. 30 tablet 3   TURMERIC PO Take by mouth.     No current facility-administered medications for this visit.    PHYSICAL EXAMINATION: ECOG PERFORMANCE STATUS: 0 - Asymptomatic  Vitals:   04/07/20 0957  BP: 129/83  Pulse: 84  Resp: 18  Temp: 97.9 F (36.6 C)  SpO2: 98%   Filed Weights   04/07/20 0957  Weight: 180 lb (81.6 kg)    GENERAL:alert, no distress and comfortable SKIN: skin color, texture, turgor are normal, no rashes or significant lesions EYES: normal, Conjunctiva are pink and non-injected, sclera clear  NECK: supple, thyroid normal size, non-tender, without nodularity LYMPH:  no palpable lymphadenopathy in the cervical, axillary  LUNGS: clear to auscultation and percussion with normal breathing effort HEART: regular rate & rhythm and no murmurs and no lower extremity edema ABDOMEN:abdomen soft, non-tender and normal bowel sounds Musculoskeletal:no cyanosis of digits and no clubbing  NEURO: alert & oriented x 3 with fluent speech, no focal motor/sensory deficits BREAST: S/p right lumpectomy: Surgical incision healed well with firm breast tissue (+) Diffuse skin erythema and hyperpigmentation from RT. No skin breakdown.   LABORATORY DATA:  I have reviewed the data as listed CBC Latest Ref Rng & Units 12/10/2019 08/04/2019 08/02/2018  WBC 4.0 - 10.5 K/uL 8.2 6.8 6.5  Hemoglobin 12.0 - 15.0 g/dL 14.1 14.3 14.1  Hematocrit 36 - 46 % 43.0 41.9 40.9  Platelets 150 - 400 K/uL 303 307.0 296.0     CMP Latest Ref Rng & Units 12/10/2019 08/04/2019 08/02/2018  Glucose 70 - 99 mg/dL 109(H) 95 93  BUN 8 - 23 mg/dL $Remove'17 16 18  'FBOauSp$ Creatinine 0.44 - 1.00 mg/dL 0.92 0.84 0.94  Sodium 135 - 145 mmol/L 140 141 142  Potassium 3.5 - 5.1 mmol/L 4.5 4.5 4.6  Chloride 98 -  111 mmol/L 105 103 104  CO2 22 - 32 mmol/L $RemoveB'24 28 28  'tKQRpsFW$ Calcium 8.9 - 10.3 mg/dL 10.7(H) 9.9 9.9  Total Protein 6.5 - 8.1 g/dL 8.1 7.4 7.1  Total Bilirubin 0.3 - 1.2 mg/dL 0.5 0.6 0.6  Alkaline Phos 38 - 126 U/L 140(H) 129(H) 126(H)  AST 15 - 41 U/L $Remo'22 21 19  'byhsm$ ALT 0 - 44 U/L $Remo'22 25 19      'GDoXE$ RADIOGRAPHIC STUDIES: I have personally reviewed the radiological images as listed and agreed with the findings in the report. No results found.   ASSESSMENT & PLAN:  Katherine Hanna is a 64 y.o. female with  1. Malignant neoplasm of upper-inner quadrant of right breast, Stage 1A, p(T1cN1aM0), ER+/PR+/HER2-, Grade II  -She was diagnosed in 11/2019 with 1.1cm right breast mass of Invasive ductal carcinoma with DCIS components.  -She did not have genetic testing.  -She underwent right lumpectomy and SLNB with Dr Barry Dienes on 01/20/20. Surgical path showed complete resection of 1.3cm mass with clear margins and 1/4 positive LN, HER2 negative. Her Mammaprint was low risk Luminal Type A. Adjuvant chemotherapy was not recommended given her low risk disease.  -To reduce her risk of local recurrence, she proceeded with Radiation with Dr Sondra Come on 02/26/20. Plan to complete on 04/13/20. She is tolerating well with mild fatigue and moderate skin irritation.  -Given the strong ER and PR expression in her postmenopausal status, I recommend adjuvant endocrine therapy with aromatase inhibitor with Anastrozole, Letrozole or Exemestane for a total of 5-10 years to reduce the risk of cancer recurrence.   -The potential benefit and side effects, which includes but not limited to, hot flash, skin and vaginal dryness, metabolic changes ( increased blood glucose, cholesterol, weight, etc.), slightly in increased risk of cardiovascular disease, cataracts, muscular and joint discomfort, osteopenia and osteoporosis, etc, were discussed with her in great details. She is interested in Anastrozole. Plan to start after she completes radiation  in 04/2020.   -Will proceed with survivorship clinic with NP Lacie in 3 months and f/u with me in 6 months.    2. Anxiety, Arthritis  -She has started Zoloft in the past few month which has helped (before cancer diagnosis).  -She mainly has arthritis in her thumbs and right knee. Pain is occasional and manageable.  -Will monitor on Anastrozole.    3. Elevated Alk Phos  -Her Alk phos has been mildly elevated an trending up since 03/2018 labs based on Epic records.  -I previously discussed possible etiology such as bone disease. If this continues to increase will recommend bone biopsy and further work up. will repeat lab in 6 months and with PCP in interim.    PLAN:  -Continue RT. Plan to complete on 04/13/20 -I called in Anastrozole. Plan to start in 04/2020.  -Survivorship clinic with NP Lacie in 3 months -Lab and F/u with me in 6 months    No problem-specific Assessment & Plan notes found for this encounter.   No orders of the defined types were placed in this encounter.  All questions were answered. The patient knows to call the clinic with any problems, questions or concerns. No barriers to learning was detected. The total time spent in the appointment was 30 minutes.     Truitt Merle, MD 04/07/2020   I, Joslyn Devon, am acting as scribe for Truitt Merle, MD.   I have reviewed the above documentation for accuracy and completeness, and I agree with the above.

## 2020-04-05 ENCOUNTER — Ambulatory Visit
Admission: RE | Admit: 2020-04-05 | Discharge: 2020-04-05 | Disposition: A | Discharge status: Home / Self Care | Payer: 59 | Source: Ambulatory Visit | Attending: Radiation Oncology | Admitting: Radiation Oncology

## 2020-04-05 DIAGNOSIS — Z510 Encounter for antineoplastic radiation therapy: Secondary | ICD-10-CM | POA: Diagnosis not present

## 2020-04-06 ENCOUNTER — Ambulatory Visit
Admission: RE | Admit: 2020-04-06 | Discharge: 2020-04-06 | Disposition: A | Discharge status: Home / Self Care | Payer: 59 | Source: Ambulatory Visit | Attending: Radiation Oncology | Admitting: Radiation Oncology

## 2020-04-06 DIAGNOSIS — Z510 Encounter for antineoplastic radiation therapy: Secondary | ICD-10-CM | POA: Diagnosis not present

## 2020-04-07 ENCOUNTER — Telehealth: Payer: Self-pay | Admitting: Hematology

## 2020-04-07 ENCOUNTER — Ambulatory Visit
Admission: RE | Admit: 2020-04-07 | Discharge: 2020-04-07 | Disposition: A | Discharge status: Home / Self Care | Payer: 59 | Source: Ambulatory Visit | Attending: Radiation Oncology | Admitting: Radiation Oncology

## 2020-04-07 ENCOUNTER — Inpatient Hospital Stay: Payer: 59 | Attending: Hematology | Admitting: Hematology

## 2020-04-07 ENCOUNTER — Other Ambulatory Visit: Payer: Self-pay

## 2020-04-07 VITALS — BP 129/83 | HR 84 | Temp 97.9°F | Resp 18 | Ht 65.0 in | Wt 180.0 lb

## 2020-04-07 DIAGNOSIS — Z79811 Long term (current) use of aromatase inhibitors: Secondary | ICD-10-CM | POA: Insufficient documentation

## 2020-04-07 DIAGNOSIS — Z510 Encounter for antineoplastic radiation therapy: Secondary | ICD-10-CM | POA: Diagnosis not present

## 2020-04-07 DIAGNOSIS — C50211 Malignant neoplasm of upper-inner quadrant of right female breast: Secondary | ICD-10-CM | POA: Diagnosis not present

## 2020-04-07 DIAGNOSIS — Z170 Estrogen receptor positive status [ER+]: Secondary | ICD-10-CM | POA: Diagnosis not present

## 2020-04-07 MED ORDER — ANASTROZOLE 1 MG PO TABS: 1.0000 mg | ORAL_TABLET | Freq: Every day | ORAL | 3 refills | Status: DC

## 2020-04-07 NOTE — Telephone Encounter (Signed)
Scheduled per 11/17 los. No avs or calendar needed. Pt is mychart active. 

## 2020-04-08 ENCOUNTER — Ambulatory Visit
Admission: RE | Admit: 2020-04-08 | Discharge: 2020-04-08 | Disposition: A | Discharge status: Home / Self Care | Payer: 59 | Source: Ambulatory Visit | Attending: Radiation Oncology | Admitting: Radiation Oncology

## 2020-04-08 ENCOUNTER — Encounter: Payer: Self-pay | Admitting: Hematology

## 2020-04-08 DIAGNOSIS — Z510 Encounter for antineoplastic radiation therapy: Secondary | ICD-10-CM | POA: Diagnosis not present

## 2020-04-09 ENCOUNTER — Ambulatory Visit
Admission: RE | Admit: 2020-04-09 | Discharge: 2020-04-09 | Disposition: A | Discharge status: Home / Self Care | Payer: 59 | Source: Ambulatory Visit | Attending: Radiation Oncology | Admitting: Radiation Oncology

## 2020-04-09 DIAGNOSIS — Z510 Encounter for antineoplastic radiation therapy: Secondary | ICD-10-CM | POA: Diagnosis not present

## 2020-04-12 ENCOUNTER — Ambulatory Visit: Payer: 59 | Attending: General Surgery

## 2020-04-12 ENCOUNTER — Ambulatory Visit
Admission: RE | Admit: 2020-04-12 | Discharge: 2020-04-12 | Disposition: A | Discharge status: Home / Self Care | Payer: 59 | Source: Ambulatory Visit | Attending: Radiation Oncology | Admitting: Radiation Oncology

## 2020-04-12 ENCOUNTER — Other Ambulatory Visit: Payer: Self-pay

## 2020-04-12 ENCOUNTER — Encounter: Payer: Self-pay | Admitting: *Deleted

## 2020-04-12 DIAGNOSIS — Z510 Encounter for antineoplastic radiation therapy: Secondary | ICD-10-CM | POA: Diagnosis not present

## 2020-04-12 DIAGNOSIS — Z483 Aftercare following surgery for neoplasm: Secondary | ICD-10-CM

## 2020-04-12 NOTE — Therapy (Signed)
[facility] [address] [place], [place], [address] Phone: [phone]   Fax:  [phone]  Physical Therapy Treatment  Patient Details  Name: Katherine Hanna MRN: [id number] Date of Birth: 1956-05-13 Referring Provider (PT): Dr. Almond Lint   Encounter Date: 04/12/2020   PT End of Session - 04/12/20 0813    Visit Number 2   # unchange due to screen only   Number of Visits 2    Date for PT Re-Evaluation 02/04/20    PT Start Time 0804    PT Stop Time 0813    PT Time Calculation (min) 9 min    Activity Tolerance Patient tolerated treatment well    Behavior During Therapy [facility] for tasks assessed/performed           Past Medical History:  Diagnosis Date  . Allergic rhinitis   . Arthritis    knee  . Asthma   . Breast cancer (HCC)   . History of chicken pox   . Migraine   . Rheumatic fever    as a child.    Past Surgical History:  Procedure Laterality Date  . APPENDECTOMY  11/1971  . BREAST LUMPECTOMY WITH RADIOACTIVE SEED AND SENTINEL LYMPH NODE BIOPSY Right 01/20/2020   Procedure: RIGHT BREAST LUMPECTOMY WITH RADIOACTIVE SEED AND SENTINEL LYMPH NODE BIOPSY;  Surgeon: Almond Lint, MD;  Location: [facility];  Service: General;  Laterality: Right;  . COLONOSCOPY    . TONSILLECTOMY     when she was 2 or 3    There were no vitals filed for this visit.   Subjective Assessment - 04/12/20 0808    Subjective PT returns for 3 month L-Dex screen.    Pertinent History Patient was diagnosed on 11/10/2019 with right grade II invasive ductal carcinoma breast cancer. Patient underwent a right lumpectomy and sentinel node biopsy on 01/20/2020 with 1/4 positive axillary lymph nodes. It is ER/PR positive and HER2 negative with a Ki67 of 10%.                  L-DEX FLOWSHEETS - 04/12/20 0800      L-DEX LYMPHEDEMA SCREENING   BASELINE SCORE (UNILATERAL) 4.3    L-DEX SCORE (UNILATERAL) 6.1    VALUE  CHANGE (UNILAT) 1.8                                  PT Long Term Goals - 02/12/20 5619      PT LONG TERM GOAL #1   Title Patient will demonstrate she has regained full shoulder ROM and function post operatively compared to baselines.    Time 8    Period Weeks    Status Partially Met                 Plan - 04/12/20 0813    Clinical Impression Statement Pt returns for 3 month L-Dex screen reporting she is doing well. Her change from baseline of 1.8 is WNLs so no further treatment is indicated at this time except to continue every 3 month screens for up to 2 years from [facility] then every 6 months after.    PT Next Visit Plan Cont L-Dex screens as indicated above.    Consulted and Agree with Plan of Care Patient           Patient will benefit from skilled therapeutic intervention in order to improve the following deficits  and impairments:     Visit Diagnosis: Aftercare following surgery for neoplasm     Problem List Patient Active Problem List   Diagnosis Date Noted  . Malignant neoplasm of upper-inner quadrant of right breast in female, estrogen receptor positive ([place]) 12/08/2019  . Seasonal allergies 07/30/2017  . Need for diphtheria-tetanus-pertussis (Tdap) vaccine 07/30/2017  . Vitamin D deficiency 06/28/2016  . Osteoporosis 06/18/2015  . Asthma with bronchitis 06/26/2014  . Food allergy 02/28/2012  . Depression 10/11/2010  . Preventative health care 10/05/2010  . Screening for malignant neoplasm of the cervix 10/05/2010  . COMMON MIGRAINE 08/31/2009  . ALLERGIC RHINITIS 08/31/2009  . CERVICAL POLYP 08/31/2009  . ARTHRITIS 08/31/2009  . POSTMENOPAUSAL STATUS 08/31/2009    Otelia Limes, PTA 04/12/2020, 8:18 AM  [place], [place], [address] Phone: [phone]   Fax:  [phone]  Name: Katherine Hanna MRN: [id number] Date of Birth:  1956/02/20

## 2020-04-13 ENCOUNTER — Encounter: Payer: Self-pay | Admitting: Radiation Oncology

## 2020-04-13 ENCOUNTER — Ambulatory Visit
Admission: RE | Admit: 2020-04-13 | Discharge: 2020-04-13 | Disposition: A | Discharge status: Home / Self Care | Payer: 59 | Source: Ambulatory Visit | Attending: Radiation Oncology | Admitting: Radiation Oncology

## 2020-04-13 DIAGNOSIS — Z510 Encounter for antineoplastic radiation therapy: Secondary | ICD-10-CM | POA: Diagnosis not present

## 2020-05-11 ENCOUNTER — Telehealth: Payer: Self-pay | Admitting: *Deleted

## 2020-05-11 NOTE — Telephone Encounter (Signed)
CALLED PATIENT TO ASK ABOUT RESCHEDULING FU APPT. FOR 05-17-20 DUE TO DR. KINARD BEING IN THE OR, RESCHEDULED FOR 06-03-20 @ 8:30 AM, PATIENT AGREED TO NEW DATE AND TIME

## 2020-05-17 ENCOUNTER — Ambulatory Visit: Payer: Self-pay | Admitting: Radiation Oncology

## 2020-05-31 ENCOUNTER — Telehealth: Payer: Self-pay | Admitting: Nurse Practitioner

## 2020-05-31 NOTE — Telephone Encounter (Signed)
Rescheduled appointment per provider PAL request. Spoke to patient who is aware of updated appointment date and time.

## 2020-06-02 ENCOUNTER — Telehealth: Payer: Self-pay | Admitting: *Deleted

## 2020-06-02 NOTE — Telephone Encounter (Signed)
Called patient to reschedule fu for 06-03-20 due to Dr. Sondra Come being in the [place], spoke with patient and she agreed to come in on 06-10-20 @ 8:15 am

## 2020-06-03 ENCOUNTER — Ambulatory Visit: Payer: 59 | Admitting: Radiation Oncology

## 2020-06-09 NOTE — Progress Notes (Incomplete)
  Patient Name: Katherine Hanna MRN: [id number] DOB: 1955-09-30 Referring Physician: Truitt Merle (Profile Not Attached) Date of Service: 04/13/2020 [facility], [place]                                                        End Of Treatment Note  Diagnoses: C50.211-Malignant neoplasm of upper-inner quadrant of right female breast  Cancer Staging: Stage IA(pT1c, pN1a)RightBreastUIQ,Invasive DuctalCarcinomawith DCIS, ER+/ PR+/ Her2-, Grade2  Intent: Curative  Radiation Treatment Dates: 02/26/2020 through 04/13/2020 Site Technique Total Dose (Gy) Dose per Fx (Gy) Completed Fx Beam Energies  Breast, Right: Breast_Rt 3D 50.4/50.4 1.8 28/28 6X, 10X  Breast, Right: Breast_Rt_Bst 3D 12/12 2 6/6 6X, 10X  Sclav-RT: SCV_Rt 3D 50.4/50.4 1.8 28/28 6X, 10X   Narrative: The patient tolerated radiation therapy relatively well. She did report increased swelling and bruising to the lateral side of the breast/axilla. She also reported some mild fatigue, right nipple pain, pain under right arm, and limited range motion of right arm that improved throughout the day. She denied chest pain and shortness of breath. A third of the way through treatment, she was noted to have some erythema and hyperpigmentation changes without skin breakdown. As treatment continued, erythema became more significant in the upper aspect of the breast and the patient ended up developing impending moist desquamation in the inframammary fold with some skin breakdown in the supraclavicular area. No moist desquamation noted during final treatment.  Plan: The patient will follow-up with radiation oncology in one month.  ________________________________________________   Blair Promise, PhD, MD  This document serves as a record of services personally performed by  Gery Pray, MD. It was created on his behalf by Clerance Lav, a trained medical scribe. The creation of this record is based on the scribe's personal observations and the provider's statements to them. This document has been checked and approved by the attending provider.

## 2020-06-09 NOTE — Progress Notes (Signed)
Radiation Oncology         [phone] ________________________________  Name: Katherine Hanna MRN: [id number]  Date: 06/10/2020  DOB: Oct 09, 1955  Follow-Up Visit Note  CC: Ann Held, DO  Truitt Merle, MD    ICD-10-CM   1. Malignant neoplasm of upper-inner quadrant of right breast in female, estrogen receptor positive ([place])  C50.211    Z17.0     Diagnosis: Stage IA(pT1c, pN1a)RightBreastUIQ,Invasive DuctalCarcinomawith DCIS, ER+/ PR+/ Her2-, Grade2  Interval Since Last Radiation: One month and four weeks  Radiation Treatment Dates: 02/26/2020 through 04/13/2020 Site Technique Total Dose (Gy) Dose per Fx (Gy) Completed Fx Beam Energies  Breast, Right: Breast_Rt 3D 50.4/50.4 1.8 28/28 6X, 10X  Breast, Right: Breast_Rt_Bst 3D 12/12 2 6/6 6X, 10X  Sclav-RT: SCV_Rt 3D 50.4/50.4 1.8 28/28 6X, 10X    Narrative:  The patient returns today for routine follow-up. No significant interval history since the end of treatment.  She has recently retired and is joining retirement.  She is on Arimidex and tolerating this medication well  On review of systems, she reports occasional sharp pains within the right breast no consistent pain. She denies nipple discharge or bleeding.  She denies any problems with swelling in her right arm or hand..                     ALLERGIES:  is allergic to penicillins.  Meds: Current Outpatient Medications  Medication Sig Dispense Refill  . anastrozole (ARIMIDEX) 1 MG tablet Take 1 tablet (1 mg total) by mouth daily. 30 tablet 3  . Calcium Carbonate-Vitamin D (CALCIUM 500 + D PO) Take 1 tablet by mouth 2 (two) times daily.    Marland Kitchen co-enzyme Q-10 30 MG capsule Take 30 mg by mouth daily.    . fexofenadine (ALLEGRA) 180 MG tablet Take 180 mg by mouth daily.    . Misc Natural Products (TART CHERRY ADVANCED PO) Take by mouth.    . Multiple Vitamin (MULTIVITAMIN) tablet Take 1 tablet by mouth daily.    Marland Kitchen oxyCODONE (OXY IR/ROXICODONE) 5 MG immediate  release tablet Take 1 tablet (5 mg total) by mouth every 6 (six) hours as needed for severe pain. 10 tablet 0  . pantoprazole (PROTONIX) 40 MG tablet Take 1 tablet (40 mg total) by mouth daily. 90 tablet 3  . sertraline (ZOLOFT) 50 MG tablet Take 1 tablet (50 mg total) by mouth daily. 30 tablet 3  . TURMERIC PO Take by mouth.     No current facility-administered medications for this encounter.    Physical Findings: The patient is in no acute distress. Patient is alert and oriented.  height is _0  (1.651 m) and weight is 177 lb 6.4 oz (80.5 kg). Her temperature is 97.8 F (36.6 C). Her blood pressure is 138/92 (abnormal) and her pulse is 85. Her respiration is 18 and oxygen saturation is 100%. No significant changes. Lungs are clear to auscultation bilaterally. Heart has regular rate and rhythm. No palpable cervical, supraclavicular, or axillary adenopathy. Abdomen soft, non-tender, normal bowel sounds. Left breast: No palpable mass, nipple discharge, or bleeding. Right breast: Mild hyperpigmentation changes noted.  Some mild edema and induration along the lumpectomy scar.  Skin is healed well at this point.  No dominant mass appreciated in the breast nipple discharge or bleeding  Lab Findings: Lab Results  Component Value Date   WBC 8.2 12/10/2019   HGB 14.1 12/10/2019   HCT 43.0 12/10/2019   MCV 93.3 12/10/2019  PLT 303 12/10/2019    Radiographic Findings: No results found.  Impression: Stage IA(pT1c, pN1a)RightBreastUIQ,Invasive DuctalCarcinomawith DCIS, ER+/ PR+/ Her2-, Grade2  The patient is recovering from the effects of radiation.  Her skin is healed well at this point.  Plan: The patient is scheduled to follow up with Cira Rue, NP, on 07/13/2020. She will follow up with radiation oncology in as needed basis.  She will follow-up with medical oncology later this spring.  She continues to follow-up with her  surgeon   ____________________________________   Blair Promise, PhD, MD  This document serves as a record of services personally performed by Gery Pray, MD. It was created on his behalf by Clerance Lav, a trained medical scribe. The creation of this record is based on the scribe's personal observations and the provider's statements to them. This document has been checked and approved by the attending provider.

## 2020-06-10 ENCOUNTER — Other Ambulatory Visit: Payer: Self-pay

## 2020-06-10 ENCOUNTER — Ambulatory Visit
Admission: RE | Admit: 2020-06-10 | Discharge: 2020-06-10 | Disposition: A | Discharge status: Home / Self Care | Payer: Medicare HMO | Source: Ambulatory Visit | Attending: Radiation Oncology | Admitting: Radiation Oncology

## 2020-06-10 ENCOUNTER — Encounter: Payer: Self-pay | Admitting: Radiation Oncology

## 2020-06-10 DIAGNOSIS — Z79811 Long term (current) use of aromatase inhibitors: Secondary | ICD-10-CM | POA: Insufficient documentation

## 2020-06-10 DIAGNOSIS — Z170 Estrogen receptor positive status [ER+]: Secondary | ICD-10-CM | POA: Insufficient documentation

## 2020-06-10 DIAGNOSIS — C50211 Malignant neoplasm of upper-inner quadrant of right female breast: Secondary | ICD-10-CM | POA: Diagnosis not present

## 2020-06-10 DIAGNOSIS — Z923 Personal history of irradiation: Secondary | ICD-10-CM | POA: Insufficient documentation

## 2020-06-10 DIAGNOSIS — Z79899 Other long term (current) drug therapy: Secondary | ICD-10-CM | POA: Insufficient documentation

## 2020-06-10 NOTE — Progress Notes (Signed)
Patient denies having any pain or discomfort today but does report having a pain occasionally (sudden/sharp in nature).  Patient denies limitation in range of motion of arm/ no swelling.  Patient reports appetite is reasonable.  Reports energy level is improving.  Vitals:   06/10/20 0817  BP: (!) 138/92  Pulse: 85  Resp: 18  Temp: 97.8 F (36.6 C)  SpO2: 100%  Weight: 177 lb 6.4 oz (80.5 kg)  Height: 5\' 5"  (1.651 m)

## 2020-07-08 ENCOUNTER — Encounter: Payer: 59 | Admitting: Nurse Practitioner

## 2020-07-09 ENCOUNTER — Telehealth: Payer: Self-pay | Admitting: Nurse Practitioner

## 2020-07-09 NOTE — Telephone Encounter (Signed)
Contacted patient, patient is aware that upcoming appointment is virtual. Per provider request.

## 2020-07-12 ENCOUNTER — Telehealth: Payer: Medicare HMO | Admitting: Nurse Practitioner

## 2020-07-12 DIAGNOSIS — J0100 Acute maxillary sinusitis, unspecified: Secondary | ICD-10-CM

## 2020-07-12 MED ORDER — DOXYCYCLINE HYCLATE 100 MG PO TABS: 100.0000 mg | ORAL_TABLET | Freq: Two times a day (BID) | ORAL | 0 refills | Status: DC

## 2020-07-12 NOTE — Progress Notes (Signed)

## 2020-07-13 ENCOUNTER — Telehealth: Payer: Self-pay | Admitting: Hematology

## 2020-07-13 ENCOUNTER — Encounter: Payer: Self-pay | Admitting: Nurse Practitioner

## 2020-07-13 ENCOUNTER — Inpatient Hospital Stay: Payer: 59 | Attending: Hematology | Admitting: Nurse Practitioner

## 2020-07-13 DIAGNOSIS — Z170 Estrogen receptor positive status [ER+]: Secondary | ICD-10-CM | POA: Diagnosis not present

## 2020-07-13 DIAGNOSIS — C50211 Malignant neoplasm of upper-inner quadrant of right female breast: Secondary | ICD-10-CM

## 2020-07-13 MED ORDER — ANASTROZOLE 1 MG PO TABS
1.0000 mg | ORAL_TABLET | Freq: Every day | ORAL | 3 refills | Status: DC
Start: 2020-07-13 — End: 2021-05-09

## 2020-07-13 NOTE — Telephone Encounter (Signed)
Added infusion appointment per 2/22 staff message. Patient is aware.

## 2020-07-13 NOTE — Progress Notes (Signed)
CLINIC: Survivorship   Patient Care Team: [name], [name], DO as PCP - General (Family Medicine) Mardella Layman, MD as Consulting Physician (Gastroenterology) Hollar, Ronal Fear, MD as Referring Physician (Dermatology) [name], OD as Consulting Physician (Optometry) Pershing Proud, RN as Oncology Nurse Navigator Donnelly Angelica, RN as Oncology Nurse Navigator Almond Lint, MD as Consulting Physician (General Surgery) [name], MD as Consulting Physician (Hematology) Antony Blackbird, MD as Consulting Physician (Radiation Oncology) [name], NP as Nurse Practitioner (Nurse Practitioner)   I connected with Katherine Hanna on 07/13/20 at  9:45 AM EST by telephone and verified that I am speaking with the correct person using two identifiers.  I discussed the limitations, risks, security and privacy concerns of performing an evaluation and management service by telephone and the availability of in person appointments. I also discussed with the patient that there may be a patient responsible charge related to this service. The patient expressed understanding and agreed to proceed.   No other people participating in today's encounter:   Patient's location: Home Provider's location: Home office    BRIEF ONCOLOGIC HISTORY:  Oncology History Overview Note  Cancer Staging Malignant neoplasm of upper-inner quadrant of right breast in female, estrogen receptor positive (HCC) Staging form: Breast, AJCC 8th Edition - Clinical stage from 12/03/2019: Stage IA (cT1c, cN0, cM0, G2, ER+, PR+, HER2-) - Signed by [name], MD on 12/09/2019 - Pathologic stage from 01/20/2020: Stage IA (pT1c, pN1a, cM0, G2, ER+, PR+, HER2-) - Signed by [name], MD on 04/06/2020    Malignant neoplasm of upper-inner quadrant of right breast in female, estrogen receptor positive (HCC)  11/25/2019 Mammogram   Mammogram and [place] 11/25/19  IMPRESSION The 1.1x0.8x0.9cm architectural distortion in the right  breast at 3:00 position middle depth 4 cm from nipple is highly suggestive of malignancy. An US guided biopsy is recommended.     12/03/2019 Initial Biopsy   Diagnosis 12/03/19 Breast, right, needle core biopsy, 3 o'clock, 4 cmfn - INVASIVE DUCTAL CARCINOMA. - DUCTAL CARCINOMA IN SITU. Microscopic Comment The carcinoma appears grade 2. The greatest linear extent of tumor in any one core is 10 mm. Ancillary studies will be reported separately. Results reported to [employer] on 12/04/2019. Intradepartmental consultation (Dr. Charm Barges).   12/03/2019 Receptors her2   PROGNOSTIC INDICATORS Results: IMMUNOHISTOCHEMICAL AND MORPHOMETRIC ANALYSIS PERFORMED MANUALLY The tumor cells are EQUIVOCAL for Her2 (2+). Her2 by FISH will be performed and results reported separately. Estrogen Receptor: 95%, POSITIVE, STRONG STAINING INTENSITY Progesterone Receptor: 80%, POSITIVE, STRONG STAINING INTENSITY Proliferation Marker Ki67: 10%   FLUORESCENCE IN-SITU HYBRIDIZATION Results: GROUP 5: HER2 **NEGATIVE** Equivocal form of amplification of the HER2 gene was detected in the IHC 2+ tissue sample received from this individual. HER2 FISH was performed by a technologist and cell imaging and analysis on the [employer].   12/03/2019 Cancer Staging   Staging form: Breast, AJCC 8th Edition - Clinical stage from 12/03/2019: Stage IA (cT1c, cN0, cM0, G2, ER+, PR+, HER2-) - Signed by [name], MD on 12/09/2019   12/08/2019 Initial Diagnosis   Malignant neoplasm of upper-inner quadrant of right breast in female, estrogen receptor positive (HCC)   01/20/2020 Surgery   RIGHT BREAST LUMPECTOMY WITH RADIOACTIVE SEED AND SENTINEL LYMPH NODE BIOPSY by Donell Beers    01/20/2020 Pathology Results   FINAL MICROSCOPIC DIAGNOSIS:   A. BREAST, RIGHT, LUMPECTOMY:  - Invasive ductal carcinoma, grade 2, spanning 1.3 cm.  - Intermediate grade ductal carcinoma in situ.  -  Invasive carcinoma is <1 mm from the lateral margin  focally.  - In situ carcinoma is 3 mm from the lateral margin focally.  - Biopsy site.  - Fibrocystic change and usual ductal hyperplasia.  - See oncology table.   B. LYMPH NODE, RIGHT AXILLARY #1, SENTINEL, EXCISION:  - Metastatic carcinoma in one of one lymph nodes (1/1).  - Focal extracapsular extension.   C. LYMPH NODE, RIGHT AXILLARY #2, SENTINEL, EXCISION:  - One of one lymph nodes negative for carcinoma (0/1).   D. LYMPH NODE, RIGHT AXILLARY #3, SENTINEL, EXCISION:  - One of one lymph nodes negative for carcinoma (0/1).   E. LYMPH NODE, RIGHT AXILLARY #4, SENTINEL, EXCISION:  - One of one lymph nodes negative for carcinoma (0/1).     ADDENDUM:   By immunohistochemistry, HER-2 is EQUIVOCAL (2+).  HER-2 by FISH is  pending and will be reported in an addendum.   ADDENDUM:   FLOURESCENCE IN-SITU HYBRIDIZATION RESULTS:   GROUP 5:   HER2 **NEGATIVE**    01/20/2020 Miscellaneous   Mammaprint Low risk Luminal Type A - MPI at +0.311 She has 97.8% benefit of [employer] alone    01/20/2020 Cancer Staging   Staging form: Breast, AJCC 8th Edition - Pathologic stage from 01/20/2020: Stage IA (pT1c, pN1a, cM0, G2, ER+, PR+, HER2-) - Signed by Truitt Merle, MD on 04/06/2020   02/26/2020 - 04/13/2020 Radiation Therapy   Adjuvant Radiation with Dr Sondra Come    04/2020 -  Anti-estrogen oral therapy   Anastrozole $RemoveBefo'1mg'[id number]$  daily starting in 04/2020    07/13/2020 Survivorship   SCP delivered virtually by Cira Rue, NP      INTERVAL HISTORY:  Katherine Hanna to review her survivorship care plan detailing her treatment course for breast cancer, as well as monitoring long-term side effects of that treatment, education regarding health maintenance, screening, and overall wellness and health promotion.     Overall, Katherine Hanna is doing well. Just started antibiotics for sinusitis. She has no side effects from AI. Has baseline joint pain in thumbs that is stable. Skin is hyperpigmented in areas but  fading, new moles that developed during radiation also disappearing. She has some swelling above axillary incision but not painful. She will go back to [facility] clinic 2/28. Massage helps breast scar tissue. Also has baseline "cysts like grapes" that are stable. She has not been on Prolia for few years due to joint aches and cost. She has dentist visit in April or May, no oral issues. Denies other concerns.   ONCOLOGY TREATMENT TEAM:  1. Surgeon:  Dr. Barry Dienes at [facility] 2. Medical Oncologist: Dr. Burr Medico  3. Radiation Oncologist: Dr. Sondra Come    PAST MEDICAL/SURGICAL HISTORY:  Past Medical History:  Diagnosis Date  . Allergic rhinitis   . Arthritis    knee  . Asthma   . Breast cancer ([place])   . History of chicken pox   . Migraine   . Rheumatic fever    as a child.   Past Surgical History:  Procedure Laterality Date  . APPENDECTOMY  11/1971  . BREAST LUMPECTOMY WITH RADIOACTIVE SEED AND SENTINEL LYMPH NODE BIOPSY Right 01/20/2020   Procedure: RIGHT BREAST LUMPECTOMY WITH RADIOACTIVE SEED AND SENTINEL LYMPH NODE BIOPSY;  Surgeon: Stark Klein, MD;  Location: [facility];  Service: General;  Laterality: Right;  . COLONOSCOPY    . TONSILLECTOMY     when she was 2 or 3     ALLERGIES:  Allergies  Allergen  Reactions  . Penicillins Rash    Rash head to toe     CURRENT MEDICATIONS:  Outpatient Encounter Medications as of 07/13/2020  Medication Sig  . anastrozole (ARIMIDEX) 1 MG tablet Take 1 tablet (1 mg total) by mouth daily.  . Calcium Carbonate-Vitamin D (CALCIUM 500 + D PO) Take 1 tablet by mouth 2 (two) times daily.  Marland Kitchen co-enzyme Q-10 30 MG capsule Take 30 mg by mouth daily.  Marland Kitchen doxycycline (VIBRA-TABS) 100 MG tablet Take 1 tablet (100 mg total) by mouth 2 (two) times daily. 1 po bid  . fexofenadine (ALLEGRA) 180 MG tablet Take 180 mg by mouth daily.  . Misc Natural Products (TART CHERRY ADVANCED PO) Take by mouth.  . Multiple Vitamin (MULTIVITAMIN) tablet  Take 1 tablet by mouth daily.  Marland Kitchen oxyCODONE (OXY IR/ROXICODONE) 5 MG immediate release tablet Take 1 tablet (5 mg total) by mouth every 6 (six) hours as needed for severe pain.  . pantoprazole (PROTONIX) 40 MG tablet Take 1 tablet (40 mg total) by mouth daily.  . sertraline (ZOLOFT) 50 MG tablet Take 1 tablet (50 mg total) by mouth daily.  . TURMERIC PO Take by mouth.  . [DISCONTINUED] anastrozole (ARIMIDEX) 1 MG tablet Take 1 tablet (1 mg total) by mouth daily.   No facility-administered encounter medications on file as of 07/13/2020.     ONCOLOGIC FAMILY HISTORY:  Family History  Problem Relation Age of Onset  . Coronary artery disease Maternal Grandfather   . Diabetes Father   . Stroke Father   . Cancer Father        bladder  . Leukemia Father   . Stroke Maternal Grandmother        34s  . Stroke Paternal Grandmother   . Lung cancer Mother   . Colon cancer Maternal Uncle   . Breast cancer Paternal Aunt   . Esophageal cancer Neg Hx      GENETIC COUNSELING/TESTING: N/A  SOCIAL HISTORY:  Social History   Socioeconomic History  . Marital status: Married    Spouse name: Not on file  . Number of children: 2  . Years of education: Not on file  . Highest education level: Not on file  Occupational History  . Occupation: Engineer, maintenance (IT)  Tobacco Use  . Smoking status: Never Smoker  . Smokeless tobacco: Never Used  Vaping Use  . Vaping Use: Never used  Substance and Sexual Activity  . Alcohol use: No  . Drug use: No  . Sexual activity: Not on file  Other Topics Concern  . Not on file  Social History Narrative  . Not on file   Social Determinants of Health   Financial Resource Strain: Not on file  Food Insecurity: Not on file  Transportation Needs: Not on file  Physical Activity: Not on file  Stress: Not on file  Social Connections: Not on file  Intimate Partner Violence: Not on file     OBSERVATIONS/OBJECTIVE:  Patient appears well via video. Speech is clear/intact.  Normal mood/affect. No cough or conversational dyspnea   LABORATORY DATA:  None for this visit.  DIAGNOSTIC IMAGING:  None for this visit.      ASSESSMENT AND PLAN:  Ms.. Hanna is a pleasant 65 y.o. female with Stage I right breast invasive ductal carcinoma, ER+/PR+/HER2-, diagnosed in 11/2019, treated with lumpectomy, adjuvant radiation therapy, and anti-estrogen therapy with Anastrozole beginning in 04/2020.  She presents to the Survivorship Clinic for our initial meeting and routine follow-up post-completion of treatment for  breast cancer.    1. Stage I right breast cancer:  Katherine Hanna is continuing to recover from definitive treatment for breast cancer. She will follow-up with her medical oncologist, Dr. Burr Medico in 09/2019 with history and physical exam per surveillance protocol.  She will continue her anti-estrogen therapy with Anastrozole. Thus far, she is tolerating well, with minimal side effects. She was instructed to make Dr. Burr Medico or myself aware if she begins to experience any worsening side effects of the medication and I could see her back in clinic to help manage those side effects, as needed. Her mammogram is due 11/2020; orders placed today. Today, a comprehensive survivorship care plan and treatment summary was reviewed with the patient today detailing her breast cancer diagnosis, treatment course, potential late/long-term effects of treatment, appropriate follow-up care with recommendations for the future, and patient education resources.  A copy of this summary, along with a letter will be sent to the patient's primary care provider via In Basket message after today's visit.    2. Bone health:  Given Katherine Hanna's age/history of breast cancer and osteoporosis, and her current treatment regimen including anti-estrogen therapy with Anastrozole, she is at risk for further bone demineralization.  Her last DEXA scan was 2019 which showed osteoporosis RFN -2.8. she was on prolia but did not  tolerate it. I recommend switch to zometa, we discussed risk/benefit and side effects. She agrees. In the meantime, she was encouraged to increase her consumption of foods rich in calcium, as well as increase her weight-bearing activities.  She was given education on specific activities to promote bone health.  3. Cancer screening:  Due to Katherine Hanna's history and her age, she should receive screening for skin cancers, colon cancer, and gynecologic cancers.  The information and recommendations are listed on the patient's comprehensive care plan/treatment summary and were reviewed in detail with the patient.    4. Health maintenance and wellness promotion: Katherine Hanna was encouraged to consume 5-7 servings of fruits and vegetables per day. We reviewed the "Nutrition Rainbow" handout, as well as the handout "Take Control of Your Health and Reduce Your Cancer Risk" from the [place].  She was also encouraged to engage in moderate to vigorous exercise for 30 minutes per day most days of the week. We discussed the [employer] fitness program, which is designed for cancer survivors to help them become more physically fit after cancer treatments.  She was instructed to limit her alcohol consumption and continue to abstain from tobacco use.   5. Support services/counseling: It is not uncommon for this period of the patient's cancer care trajectory to be one of many emotions and stressors.  We discussed how this can be increasingly difficult during the times of quarantine and social distancing due to the COVID-19 pandemic.   She was given information regarding our available services and encouraged to contact me with any questions or for help enrolling in any of our support group/programs.    Follow up instructions:    -Return to cancer center 09/2020 -Mammogram due in 11/2020 -Follow up with surgery 07/2020 -get dental clearance, dental f/up in April or May; start zometa at next f/up   She is  welcome to return back to the Survivorship Clinic at any time; no additional follow-up needed at this time. Consider referral back to survivorship as a long-term survivor for continued surveillance.  The patient was provided an opportunity to ask questions and all were answered. The patient agreed with the  plan and demonstrated an understanding of the instructions. The patient was advised to call back or seek an in-person evaluation if the symptoms worsen or if the condition fails to improve as anticipated.   I provided 35 minutes of non-face-to-face time during this encounter.   Alla Feeling, NP

## 2020-07-19 ENCOUNTER — Ambulatory Visit: Payer: Medicare Other | Attending: Family Medicine

## 2020-07-19 ENCOUNTER — Other Ambulatory Visit: Payer: Self-pay

## 2020-07-19 DIAGNOSIS — Z483 Aftercare following surgery for neoplasm: Secondary | ICD-10-CM | POA: Insufficient documentation

## 2020-07-19 NOTE — Therapy (Signed)
[place], [place], [address] Phone: [phone]   Fax:  [phone]  Physical Therapy Treatment  Patient Details  Name: Katherine Hanna MRN: [id number] Date of Birth: 03-20-1956 Referring Provider (PT): Dr. Stark Klein   Encounter Date: 07/19/2020   PT End of Session - 07/19/20 0809    Visit Number 2   # unchanged due to screen only   Number of Visits 2    Date for PT Re-Evaluation 02/04/20    PT Start Time 0804    PT Stop Time 0811    PT Time Calculation (min) 7 min    Activity Tolerance Patient tolerated treatment well    Behavior During Therapy [facility] for tasks assessed/performed           Past Medical History:  Diagnosis Date  . Allergic rhinitis   . Arthritis    knee  . Asthma   . Breast cancer ([place])   . History of chicken pox   . Migraine   . Rheumatic fever    as a child.    Past Surgical History:  Procedure Laterality Date  . APPENDECTOMY  11/1971  . BREAST LUMPECTOMY WITH RADIOACTIVE SEED AND SENTINEL LYMPH NODE BIOPSY Right 01/20/2020   Procedure: RIGHT BREAST LUMPECTOMY WITH RADIOACTIVE SEED AND SENTINEL LYMPH NODE BIOPSY;  Surgeon: Stark Klein, MD;  Location: [facility];  Service: General;  Laterality: Right;  . COLONOSCOPY    . TONSILLECTOMY     when she was 2 or 3    There were no vitals filed for this visit.   Subjective Assessment - 07/19/20 0809    Subjective Pt returns for 3 month L-Dex screen.    Pertinent History Patient was diagnosed on 11/10/2019 with right grade II invasive ductal carcinoma breast cancer. Patient underwent a right lumpectomy and sentinel node biopsy on 01/20/2020 with 1/4 positive axillary lymph nodes. It is ER/PR positive and HER2 negative with a Ki67 of 10%.                  L-DEX FLOWSHEETS - 07/19/20 0800      L-DEX LYMPHEDEMA SCREENING   Measurement Type Unilateral    L-DEX MEASUREMENT EXTREMITY Upper Extremity     POSITION  Standing    DOMINANT SIDE Right    At Risk Side Right    BASELINE SCORE (UNILATERAL) 4.3    L-DEX SCORE (UNILATERAL) 9.2    VALUE CHANGE (UNILAT) 4.9                                  PT Long Term Goals - 02/12/20 5361      PT LONG TERM GOAL #1   Title Patient will demonstrate she has regained full shoulder ROM and function post operatively compared to baselines.    Time 8    Period Weeks    Status Partially Met                 Plan - 07/19/20 0813    Clinical Impression Statement Pt returns for her 3 month L-Dex screen. Her change from baseline of 4.9 is WNLs so no further treatment is required at this time except to cont every 3 month L-Dex screens which pt is agreeable to.    PT Next Visit Plan Cont every 3 month L-dex screens for up to 2 years from her SLNB.    Consulted  and Agree with Plan of Care Patient           Patient will benefit from skilled therapeutic intervention in order to improve the following deficits and impairments:     Visit Diagnosis: Aftercare following surgery for neoplasm     Problem List Patient Active Problem List   Diagnosis Date Noted  . Malignant neoplasm of upper-inner quadrant of right breast in female, estrogen receptor positive ([place]) 12/08/2019  . Seasonal allergies 07/30/2017  . Need for diphtheria-tetanus-pertussis (Tdap) vaccine 07/30/2017  . Vitamin D deficiency 06/28/2016  . Osteoporosis 06/18/2015  . Asthma with bronchitis 06/26/2014  . Food allergy 02/28/2012  . Depression 10/11/2010  . Preventative health care 10/05/2010  . Screening for malignant neoplasm of the cervix 10/05/2010  . COMMON MIGRAINE 08/31/2009  . ALLERGIC RHINITIS 08/31/2009  . CERVICAL POLYP 08/31/2009  . ARTHRITIS 08/31/2009  . POSTMENOPAUSAL STATUS 08/31/2009    Otelia Limes, PTA 07/19/2020, 8:15 AM  [place], [place], [address] Phone: [phone]   Fax:  [phone]  Name: Katherine Hanna MRN: [id number] Date of Birth: 09-26-1955

## 2020-07-20 DIAGNOSIS — Z170 Estrogen receptor positive status [ER+]: Secondary | ICD-10-CM | POA: Diagnosis not present

## 2020-07-20 DIAGNOSIS — C50211 Malignant neoplasm of upper-inner quadrant of right female breast: Secondary | ICD-10-CM | POA: Diagnosis not present

## 2020-08-04 ENCOUNTER — Other Ambulatory Visit: Payer: Self-pay

## 2020-08-05 ENCOUNTER — Other Ambulatory Visit: Payer: Self-pay

## 2020-08-05 ENCOUNTER — Ambulatory Visit (INDEPENDENT_AMBULATORY_CARE_PROVIDER_SITE_OTHER): Payer: Medicare HMO | Admitting: Family Medicine

## 2020-08-05 ENCOUNTER — Encounter: Payer: Self-pay | Admitting: Family Medicine

## 2020-08-05 VITALS — BP 130/88 | HR 86 | Temp 98.4°F | Resp 18 | Ht 65.0 in | Wt 177.0 lb

## 2020-08-05 DIAGNOSIS — Z1211 Encounter for screening for malignant neoplasm of colon: Secondary | ICD-10-CM | POA: Diagnosis not present

## 2020-08-05 DIAGNOSIS — R739 Hyperglycemia, unspecified: Secondary | ICD-10-CM

## 2020-08-05 DIAGNOSIS — K219 Gastro-esophageal reflux disease without esophagitis: Secondary | ICD-10-CM

## 2020-08-05 DIAGNOSIS — Z136 Encounter for screening for cardiovascular disorders: Secondary | ICD-10-CM

## 2020-08-05 DIAGNOSIS — E559 Vitamin D deficiency, unspecified: Secondary | ICD-10-CM

## 2020-08-05 DIAGNOSIS — E2839 Other primary ovarian failure: Secondary | ICD-10-CM | POA: Diagnosis not present

## 2020-08-05 DIAGNOSIS — Z0000 Encounter for general adult medical examination without abnormal findings: Secondary | ICD-10-CM | POA: Diagnosis not present

## 2020-08-05 LAB — LIPID PANEL
Cholesterol: 231 mg/dL — ABNORMAL HIGH (ref 0–200)
HDL: 50.7 mg/dL (ref >39.00)
LDL Cholesterol: 158 mg/dL — ABNORMAL HIGH (ref 0–99)
NonHDL: 179.95
Total CHOL/HDL Ratio: 5
Triglycerides: 109 mg/dL (ref 0.0–149.0)
VLDL: 21.8 mg/dL (ref 0.0–40.0)

## 2020-08-05 LAB — COMPREHENSIVE METABOLIC PANEL
ALT: 17 U/L (ref 0–35)
AST: 19 U/L (ref 0–37)
Albumin: 4.3 g/dL (ref 3.5–5.2)
Alkaline Phosphatase: 115 U/L (ref 39–117)
BUN: 15 mg/dL (ref 6–23)
CO2: 26 mEq/L (ref 19–32)
Calcium: 9.9 mg/dL (ref 8.4–10.5)
Chloride: 103 mEq/L (ref 96–112)
Creatinine, Ser: 0.77 mg/dL (ref 0.40–1.20)
GFR: 80.36 mL/min (ref >60.00)
Glucose, Bld: 96 mg/dL (ref 70–99)
Potassium: 4.6 mEq/L (ref 3.5–5.1)
Sodium: 139 mEq/L (ref 135–145)
Total Bilirubin: 0.5 mg/dL (ref 0.2–1.2)
Total Protein: 6.9 g/dL (ref 6.0–8.3)

## 2020-08-05 LAB — CBC WITH DIFFERENTIAL/PLATELET
Basophils Absolute: 0 10*3/uL (ref 0.0–0.1)
Basophils Relative: 0.2 % (ref 0.0–3.0)
Eosinophils Absolute: 0.1 10*3/uL (ref 0.0–0.7)
Eosinophils Relative: 1.7 % (ref 0.0–5.0)
HCT: 39.1 % (ref 36.0–46.0)
Hemoglobin: 13.4 g/dL (ref 12.0–15.0)
Lymphocytes Relative: 21.6 % (ref 12.0–46.0)
Lymphs Abs: 1 10*3/uL (ref 0.7–4.0)
MCHC: 34.4 g/dL (ref 30.0–36.0)
MCV: 90.9 fl (ref 78.0–100.0)
Monocytes Absolute: 0.3 10*3/uL (ref 0.1–1.0)
Monocytes Relative: 6.8 % (ref 3.0–12.0)
Neutro Abs: 3.2 10*3/uL (ref 1.4–7.7)
Neutrophils Relative %: 69.7 % (ref 43.0–77.0)
Platelets: 291 10*3/uL (ref 150.0–400.0)
RBC: 4.3 Mil/uL (ref 3.87–5.11)
RDW: 14 % (ref 11.5–15.5)
WBC: 4.6 10*3/uL (ref 4.0–10.5)

## 2020-08-05 LAB — HEMOGLOBIN A1C: Hgb A1c MFr Bld: 5.7 % (ref 4.6–6.5)

## 2020-08-05 LAB — VITAMIN D 25 HYDROXY (VIT D DEFICIENCY, FRACTURES): VITD: 64.31 ng/mL (ref 30.00–100.00)

## 2020-08-05 MED ORDER — PANTOPRAZOLE SODIUM 40 MG PO TBEC: 40.0000 mg | DELAYED_RELEASE_TABLET | Freq: Every day | ORAL | 3 refills | Status: DC

## 2020-08-05 NOTE — Patient Instructions (Signed)
Preventive Care 67 Years and Older, Female Preventive care refers to lifestyle choices and visits with your health care provider that can promote health and wellness. This includes:  A yearly physical exam. This is also called an annual wellness visit.  Regular dental and eye exams.  Immunizations.  Screening for certain conditions.  Healthy lifestyle choices, such as: ? Eating a healthy diet. ? Getting regular exercise. ? Not using drugs or products that contain nicotine and tobacco. ? Limiting alcohol use. What can I expect for my preventive care visit? Physical exam Your health care provider will check your:  Height and weight. These may be used to calculate your BMI (body mass index). BMI is a measurement that tells if you are at a healthy weight.  Heart rate and blood pressure.  Body temperature.  Skin for abnormal spots. Counseling Your health care provider may ask you questions about your:  Past medical problems.  Family's medical history.  Alcohol, tobacco, and drug use.  Emotional well-being.  Home life and relationship well-being.  Sexual activity.  Diet, exercise, and sleep habits.  History of falls.  Memory and ability to understand (cognition).  Work and work Statistician.  Pregnancy and menstrual history.  Access to firearms. What immunizations do I need? Vaccines are usually given at various ages, according to a schedule. Your health care provider will recommend vaccines for you based on your age, medical history, and lifestyle or other factors, such as travel or where you work.   What tests do I need? Blood tests  Lipid and cholesterol levels. These may be checked every 5 years, or more often depending on your overall health.  Hepatitis C test.  Hepatitis B test. Screening  Lung cancer screening. You may have this screening every year starting at age 67 if you have a 30-pack-year history of smoking and currently smoke or have quit within  the past 15 years.  Colorectal cancer screening. ? All adults should have this screening starting at age 67 and continuing until age 67. ? Your health care provider may recommend screening at age 67 if you are at increased risk. ? You will have tests every 1-10 years, depending on your results and the type of screening test.  Diabetes screening. ? This is done by checking your blood sugar (glucose) after you have not eaten for a while (fasting). ? You may have this done every 1-3 years.  Mammogram. ? This may be done every 1-2 years. ? Talk with your health care provider about how often you should have regular mammograms.  Abdominal aortic aneurysm (AAA) screening. You may need this if you are a current or former smoker.  BRCA-related cancer screening. This may be done if you have a family history of breast, ovarian, tubal, or peritoneal cancers. Other tests  STD (sexually transmitted disease) testing, if you are at risk.  Bone density scan. This is done to screen for osteoporosis. You may have this done starting at age 67. Talk with your health care provider about your test results, treatment options, and if necessary, the need for more tests. Follow these instructions at home: Eating and drinking  Eat a diet that includes fresh fruits and vegetables, whole grains, lean protein, and low-fat dairy products. Limit your intake of foods with high amounts of sugar, saturated fats, and salt.  Take vitamin and mineral supplements as recommended by your health care provider.  Do not drink alcohol if your health care provider tells you not to drink.  If you drink alcohol: ? Limit how much you have to 0-1 drink a day. ? Be aware of how much alcohol is in your drink. In the [place] one drink equals one 12 oz bottle of beer (355 mL), one 5 oz glass of wine (148 mL), or one 1 oz glass of hard liquor (44 mL).   Lifestyle  Take daily care of your teeth and gums. Brush your teeth every morning  and night with fluoride toothpaste. Floss one time each day.  Stay active. Exercise for at least 30 minutes 5 or more days each week.  Do not use any products that contain nicotine or tobacco, such as cigarettes, e-cigarettes, and chewing tobacco. If you need help quitting, ask your health care provider.  Do not use drugs.  If you are sexually active, practice safe sex. Use a condom or other form of protection in order to prevent STIs (sexually transmitted infections).  Talk with your health care provider about taking a low-dose aspirin or statin.  Find healthy ways to cope with stress, such as: ? Meditation, yoga, or listening to music. ? Journaling. ? Talking to a trusted person. ? Spending time with friends and family. Safety  Always wear your seat belt while driving or riding in a vehicle.  Do not drive: ? If you have been drinking alcohol. Do not ride with someone who has been drinking. ? When you are tired or distracted. ? While texting.  Wear a helmet and other protective equipment during sports activities.  If you have firearms in your house, make sure you follow all gun safety procedures. What's next?  Visit your health care provider once a year for an annual wellness visit.  Ask your health care provider how often you should have your eyes and teeth checked.  Stay up to date on all vaccines. This information is not intended to replace advice given to you by your health care provider. Make sure you discuss any questions you have with your health care provider. Document Revised: 04/28/2020 Document Reviewed: 05/02/2018 Elsevier Patient Education  2021 Elsevier Inc.  

## 2020-08-05 NOTE — Progress Notes (Addendum)
Subjective:    Katherine Hanna is a 65 y.o. female who presents for a Welcome to Medicare exam.   Review of Systems  Review of Systems  Constitutional: Negative for activity change, appetite change and fatigue.  HENT: Negative for hearing loss, congestion, tinnitus and ear discharge.   Eyes: Negative for visual disturbance (see optho q1y -- vision corrected to 20/20 with glasses).  Respiratory: Negative for cough, chest tightness and shortness of breath.   Cardiovascular: Negative for chest pain, palpitations and leg swelling.  Gastrointestinal: Negative for abdominal pain, diarrhea, constipation and abdominal distention.  Genitourinary: Negative for urgency, frequency, decreased urine volume and difficulty urinating.  Musculoskeletal: Negative for back pain, arthralgias and gait problem.  Skin: Negative for color change, pallor and rash.  Neurological: Negative for dizziness, light-headedness, numbness and headaches.  Hematological: Negative for adenopathy. Does not bruise/bleed easily.  Psychiatric/Behavioral: Negative for suicidal ideas, confusion, sleep disturbance, self-injury, dysphoric mood, decreased concentration and agitation.  Pt is able to read and write and can do all ADLs No risk for falling No abuse/ violence in home          Objective:    Today's Vitals   08/05/20 0905  BP: 130/88  Pulse: 86  Resp: 18  Temp: 98.4 F (36.9 C)  TempSrc: Oral  SpO2: 98%  Weight: 177 lb (80.3 kg)  Height: 5\' 5"  (1.651 m)  Body mass index is 29.45 kg/m.  Medications Outpatient Encounter Medications as of 08/05/2020  Medication Sig  . anastrozole (ARIMIDEX) 1 MG tablet Take 1 tablet (1 mg total) by mouth daily.  . Calcium Carbonate-Vitamin D (CALCIUM 500 + D PO) Take 1 tablet by mouth 2 (two) times daily.  Marland Kitchen co-enzyme Q-10 30 MG capsule Take 30 mg by mouth daily.  . fexofenadine (ALLEGRA) 180 MG tablet Take 180 mg by mouth daily.  . Misc Natural Products (TART CHERRY  ADVANCED PO) Take by mouth.  . Multiple Vitamin (MULTIVITAMIN) tablet Take 1 tablet by mouth daily.  . TURMERIC PO Take by mouth.  . [DISCONTINUED] pantoprazole (PROTONIX) 40 MG tablet Take 1 tablet (40 mg total) by mouth daily.  Marland Kitchen doxycycline (VIBRA-TABS) 100 MG tablet Take 1 tablet (100 mg total) by mouth 2 (two) times daily. 1 po bid (Patient not taking: Reported on 08/05/2020)  . pantoprazole (PROTONIX) 40 MG tablet Take 1 tablet (40 mg total) by mouth daily.  . sertraline (ZOLOFT) 50 MG tablet Take 1 tablet (50 mg total) by mouth daily. (Patient not taking: Reported on 08/05/2020)  . [DISCONTINUED] oxyCODONE (OXY IR/ROXICODONE) 5 MG immediate release tablet Take 1 tablet (5 mg total) by mouth every 6 (six) hours as needed for severe pain.   No facility-administered encounter medications on file as of 08/05/2020.     History: Past Medical History:  Diagnosis Date  . Allergic rhinitis   . Arthritis    knee  . Asthma   . Breast cancer ([place])   . History of chicken pox   . Migraine   . Rheumatic fever    as a child.   Past Surgical History:  Procedure Laterality Date  . APPENDECTOMY  11/1971  . BREAST LUMPECTOMY WITH RADIOACTIVE SEED AND SENTINEL LYMPH NODE BIOPSY Right 01/20/2020   Procedure: RIGHT BREAST LUMPECTOMY WITH RADIOACTIVE SEED AND SENTINEL LYMPH NODE BIOPSY;  Surgeon: Stark Klein, MD;  Location: [facility];  Service: General;  Laterality: Right;  . COLONOSCOPY    . TONSILLECTOMY     when she  was 2 or 3    Family History  Problem Relation Age of Onset  . Coronary artery disease Maternal Grandfather   . Diabetes Father   . Stroke Father   . Cancer Father        bladder  . Leukemia Father   . Stroke Maternal Grandmother        65s  . Stroke Paternal Grandmother   . Lung cancer Mother   . Colon cancer Maternal Uncle   . Breast cancer Paternal Aunt   . Esophageal cancer Neg Hx    Social History   Occupational History  . Occupation: Engineer, maintenance (IT)   Tobacco Use  . Smoking status: Never Smoker  . Smokeless tobacco: Never Used  Vaping Use  . Vaping Use: Never used  Substance and Sexual Activity  . Alcohol use: No  . Drug use: No  . Sexual activity: Yes    Partners: Male    Tobacco Counseling Counseling given: Not Answered   Immunizations and Health Maintenance Immunization History  Administered Date(s) Administered  . Fluad Quad(high Dose 65+) 03/28/2019  . Hep A / Hep B 03/15/2010, 07/25/2010  . Influenza Whole 02/19/2013  . Influenza, High Dose Seasonal PF 03/05/2020  . Influenza,inj,Quad PF,6+ Mos 03/13/2014  . Influenza-Unspecified 03/23/2015, 03/28/2017, 03/14/2018  . Moderna Sars-Covid-2 Vaccination 07/04/2019, 07/28/2019, 03/19/2020  . Pneumococcal Polysaccharide-23 06/18/2015  . Tdap 09/02/2006, 07/30/2017  . Zoster 04/28/2014  . Zoster Recombinat (Shingrix) 08/02/2018, 12/09/2018   Health Maintenance Due  Topic Date Due  . URINE MICROALBUMIN  Never done  . PNA vac Low Risk Adult (1 of 2 - PCV13) 02/16/2019  . COLONOSCOPY (Pts 45-59yrs Insurance coverage will need to be confirmed)  10/12/2019  . DEXA SCAN  11/01/2019    Activities of Daily Living In your present state of health, do you have any difficulty performing the following activities: 08/05/2020 01/20/2020  Hearing? N N  Vision? N N  Difficulty concentrating or making decisions? N N  Walking or climbing stairs? N N  Dressing or bathing? N N  Doing errands, shopping? N -  Some recent data might be hidden    Physical Exam   BP 130/88 (BP Location: Left Arm, Patient Position: Sitting, Cuff Size: Normal)   Pulse 86   Temp 98.4 F (36.9 C) (Oral)   Resp 18   Ht 5\' 5"  (1.651 m)   Wt 177 lb (80.3 kg)   SpO2 98%   BMI 29.45 kg/m  General appearance: alert, cooperative, appears stated age and no distress Head: Normocephalic, without obvious abnormality, atraumatic Eyes: negative findings: lids and lashes normal, conjunctivae and sclerae normal  and pupils equal, round, reactive to light and accomodation Ears: normal TM's and external ear canals both ears Neck: no adenopathy, no carotid bruit, no JVD, supple, symmetrical, trachea midline and thyroid not enlarged, symmetric, no tenderness/mass/nodules Back: symmetric, no curvature. ROM normal. No CVA tenderness. Lungs: clear to auscultation bilaterally Breasts: deferred --- done by surgeon and oncologist Heart: regular rate and rhythm, S1, S2 normal, no murmur, click, rub or gallop Abdomen: soft, non-tender; bowel sounds normal; no masses,  no organomegaly Pelvic: not indicated; post-menopausal, no abnormal Pap smears in past Extremities: extremities normal, atraumatic, no cyanosis or edema Pulses: 2+ and symmetric Skin: Skin color, texture, turgor normal. No rashes or lesions Lymph nodes: Cervical, supraclavicular, and axillary nodes normal. Neurologic: Alert and oriented X 3, normal strength and tone. Normal symmetric reflexes. Normal coordination and gait  Advanced Directives: Does Patient Have a Medical  Advance Directive?: Yes Type of Advance Directive: [place] will Does patient want to make changes to medical advance directive?: No - Patient declined Copy of [place] in Chart?: No - copy requested   EKG--- no change from 10/05/10 Assessment:    This is a routine wellness examination for this patient .   Vision/Hearing screen  Hearing Screening   125Hz  250Hz  500Hz  1000Hz  2000Hz  3000Hz  4000Hz  6000Hz  8000Hz   Right ear:           Left ear:           Comments: Normal whisper    Visual Acuity Screening   Right eye Left eye Both eyes  Without correction:     With correction: 20/20 20/25 20/25     Dietary issues and exercise activities discussed:  Current Exercise Habits: Home exercise routine, Type of exercise: walking, Time (Minutes): 30, Frequency (Times/Week): 3, Weekly Exercise (Minutes/Week): 90, Intensity: Mild, Exercise  limited by: None identified  Goals   None    Depression Screen PHQ 2/9 Scores 08/05/2020 08/04/2019 08/02/2018 04/24/2013  PHQ - 2 Score 0 4 0 0  PHQ- 9 Score - 5 - -     Fall Risk Fall Risk  08/05/2020  Falls in the past year? 0  Number falls in past yr: -  Injury with Fall? -  Risk for fall due to : No Fall Risks  Follow up -    Cognitive Function: MMSE - Mini Mental State Exam 08/05/2020  Orientation to time 5  Orientation to Place 5  Registration 3  Attention/ Calculation 5  Recall 3  Language- name 2 objects 2  Language- repeat 1  Language- follow 3 step command 3  Language- read & follow direction 1  Write a sentence 1  Copy design 1  Total score 30      Health Maintenance  Topic Date Due  . URINE MICROALBUMIN  Never done  . PNA vac Low Risk Adult (1 of 2 - PCV13) 02/16/2019  . COLONOSCOPY (Pts 45-26yrs Insurance coverage will need to be confirmed)  10/12/2019  . DEXA SCAN  11/01/2019  . COVID-19 Vaccine (4 - Booster for Moderna series) 09/17/2020  . MAMMOGRAM  11/24/2020  . TETANUS/TDAP  07/31/2027  . INFLUENZA VACCINE  Completed  . Hepatitis C Screening  Completed  . HPV VACCINES  Aged Out       Patient Care Team: Carollee Herter, Alferd Apa, DO as PCP - General (Family Medicine) Sable Feil, MD as Consulting Physician (Gastroenterology) Hollar, Katharine Look, MD as Referring Physician (Dermatology) Lynnell Dike, OD as Consulting Physician (Optometry) Mauro Kaufmann, RN as Oncology Nurse Navigator Rockwell Germany, RN as Oncology Nurse Navigator Stark Klein, MD as Consulting Physician (General Surgery) Truitt Merle, MD as Consulting Physician (Hematology) Gery Pray, MD as Consulting Physician (Radiation Oncology) Alla Feeling, NP as Nurse Practitioner (Nurse Practitioner)     Plan:   ghm utd Check labs  I have personally reviewed and noted the following in the patient's chart:   . Medical and social history . Use of alcohol, tobacco  or illicit drugs  . Current medications and supplements . Functional ability and status . Nutritional status . Physical activity . Advanced directives . List of other physicians . Hospitalizations, surgeries, and ER visits in previous 12 months . Vitals . Screenings to include cognitive, depression, and falls . Referrals and appointments  In addition, I have reviewed and discussed with patient certain preventive  protocols, quality metrics, and best practice recommendations. A written personalized care plan for preventive services as well as general preventive health recommendations were provided to patient.    1. Gastroesophageal reflux disease Stable  - pantoprazole (PROTONIX) 40 MG tablet; Take 1 tablet (40 mg total) by mouth daily.  Dispense: 90 tablet; Refill: 3  2. Preventative health care See above  - EKG 12-Lead----  ekg-- low voltage otherwise normal ---- -last ekg 2012-- Left atrial enlargement   3. Welcome to Medicare preventive visit See above   4. Ischemic heart disease screen Check labs  - Lipid panel - CBC with Differential/Platelet - Comprehensive metabolic panel  5. Encounter for Medicare annual wellness exam   6. Colon cancer screening  - Ambulatory referral to Gastroenterology  7. Estrogen deficiency  - DG Bone Density; Future  8. Hyperglycemia Check labs [id number] to be checked, minimize simple carbs. Increase exercise as tolerated. Continue current meds  - Hemoglobin A1c  9. Hypercalcemia   - PTH, intact (no Ca)  10. Vitamin D deficiency Check labs  - Vitamin D (25 hydroxy)   Ann Held, DO 08/05/2020

## 2020-08-06 LAB — PARATHYROID HORMONE, INTACT (NO CA): PTH: 43 pg/mL (ref 16–77)

## 2020-08-08 ENCOUNTER — Other Ambulatory Visit: Payer: Self-pay | Admitting: Family Medicine

## 2020-08-08 DIAGNOSIS — E785 Hyperlipidemia, unspecified: Secondary | ICD-10-CM

## 2020-08-17 DIAGNOSIS — L814 Other melanin hyperpigmentation: Secondary | ICD-10-CM | POA: Diagnosis not present

## 2020-08-17 DIAGNOSIS — X32XXXS Exposure to sunlight, sequela: Secondary | ICD-10-CM | POA: Diagnosis not present

## 2020-08-17 DIAGNOSIS — L821 Other seborrheic keratosis: Secondary | ICD-10-CM | POA: Diagnosis not present

## 2020-08-17 DIAGNOSIS — D225 Melanocytic nevi of trunk: Secondary | ICD-10-CM | POA: Diagnosis not present

## 2020-08-17 DIAGNOSIS — D1801 Hemangioma of skin and subcutaneous tissue: Secondary | ICD-10-CM | POA: Diagnosis not present

## 2020-10-01 NOTE — Progress Notes (Signed)
[place]   [phone] [phone]) [phone]   Clinic Follow up Note   Patient Care Team: Carollee Herter, Alferd Apa, DO as PCP - General (Family Medicine) Sable Feil, MD as Consulting Physician (Gastroenterology) Hollar, Katharine Look, MD as Referring Physician (Dermatology) Lynnell Dike, OD as Consulting Physician (Optometry) Mauro Kaufmann, RN as Oncology Nurse Navigator Rockwell Germany, RN as Oncology Nurse Navigator Stark Klein, MD as Consulting Physician (General Surgery) Truitt Merle, MD as Consulting Physician (Hematology) Gery Pray, MD as Consulting Physician (Radiation Oncology) Alla Feeling, NP as Nurse Practitioner (Nurse Practitioner)  Date of Service:  10/06/2020  CHIEF COMPLAINT: F/u of right breast cancer  SUMMARY OF ONCOLOGIC HISTORY: Oncology History Overview Note  Cancer Staging Malignant neoplasm of upper-inner quadrant of right breast in female, estrogen receptor positive ([place]) Staging form: Breast, AJCC 8th Edition - Clinical stage from 12/03/2019: Stage IA (cT1c, cN0, cM0, G2, ER+, PR+, HER2-) - Signed by Truitt Merle, MD on 12/09/2019 - Pathologic stage from 01/20/2020: Stage IA (pT1c, pN1a, cM0, G2, ER+, PR+, HER2-) - Signed by Truitt Merle, MD on 04/06/2020    Malignant neoplasm of upper-inner quadrant of right breast in female, estrogen receptor positive ([place])  11/25/2019 Mammogram   Mammogram and [place] 11/25/19  IMPRESSION The 1.1x0.8x0.9cm architectural distortion in the right breast at 3:00 position middle depth 4 cm from nipple is highly suggestive of malignancy. An US guided biopsy is recommended.     12/03/2019 Initial Biopsy   Diagnosis 12/03/19 Breast, right, needle core biopsy, 3 o'clock, 4 cmfn - INVASIVE DUCTAL CARCINOMA. - DUCTAL CARCINOMA IN SITU. Microscopic Comment The carcinoma appears grade 2. The greatest linear extent of tumor in any one core is 10 mm. Ancillary studies will be reported separately.  Results reported to [employer] on 12/04/2019. Intradepartmental consultation (Dr. Melina Copa).   12/03/2019 Receptors her2   PROGNOSTIC INDICATORS Results: IMMUNOHISTOCHEMICAL AND MORPHOMETRIC ANALYSIS PERFORMED MANUALLY The tumor cells are EQUIVOCAL for Her2 (2+). Her2 by FISH will be performed and results reported separately. Estrogen Receptor: 95%, POSITIVE, STRONG STAINING INTENSITY Progesterone Receptor: 80%, POSITIVE, STRONG STAINING INTENSITY Proliferation Marker Ki67: 10%   FLUORESCENCE IN-SITU HYBRIDIZATION Results: GROUP 5: HER2 **NEGATIVE** Equivocal form of amplification of the HER2 gene was detected in the IHC 2+ tissue sample received from this individual. HER2 FISH was performed by a technologist and cell imaging and analysis on the [employer].   12/03/2019 Cancer Staging   Staging form: Breast, AJCC 8th Edition - Clinical stage from 12/03/2019: Stage IA (cT1c, cN0, cM0, G2, ER+, PR+, HER2-) - Signed by Truitt Merle, MD on 12/09/2019   12/08/2019 Initial Diagnosis   Malignant neoplasm of upper-inner quadrant of right breast in female, estrogen receptor positive ([place])   01/20/2020 Surgery   RIGHT BREAST LUMPECTOMY WITH RADIOACTIVE SEED AND SENTINEL LYMPH NODE BIOPSY by Barry Dienes    01/20/2020 Pathology Results   FINAL MICROSCOPIC DIAGNOSIS:   A. BREAST, RIGHT, LUMPECTOMY:  - Invasive ductal carcinoma, grade 2, spanning 1.3 cm.  - Intermediate grade ductal carcinoma in situ.  - Invasive carcinoma is <1 mm from the lateral margin focally.  - In situ carcinoma is 3 mm from the lateral margin focally.  - Biopsy site.  - Fibrocystic change and usual ductal hyperplasia.  - See oncology table.   B. LYMPH NODE, RIGHT AXILLARY #1, SENTINEL, EXCISION:  - Metastatic carcinoma in one of one lymph nodes (1/1).  - Focal extracapsular extension.   C. LYMPH NODE, RIGHT AXILLARY #  2, SENTINEL, EXCISION:  - One of one lymph nodes negative for carcinoma (0/1).   D. LYMPH NODE,  RIGHT AXILLARY #3, SENTINEL, EXCISION:  - One of one lymph nodes negative for carcinoma (0/1).   E. LYMPH NODE, RIGHT AXILLARY #4, SENTINEL, EXCISION:  - One of one lymph nodes negative for carcinoma (0/1).     ADDENDUM:   By immunohistochemistry, HER-2 is EQUIVOCAL (2+).  HER-2 by FISH is  pending and will be reported in an addendum.   ADDENDUM:   FLOURESCENCE IN-SITU HYBRIDIZATION RESULTS:   GROUP 5:   HER2 **NEGATIVE**    01/20/2020 Miscellaneous   Mammaprint Low risk Luminal Type A - MPI at +0.311 She has 97.8% benefit of Horomal Therpay alone    01/20/2020 Cancer Staging   Staging form: Breast, AJCC 8th Edition - Pathologic stage from 01/20/2020: Stage IA (pT1c, pN1a, cM0, G2, ER+, PR+, HER2-) - Signed by Truitt Merle, MD on 04/06/2020   02/26/2020 - 04/13/2020 Radiation Therapy   Adjuvant Radiation with Dr Sondra Come    04/2020 -  Anti-estrogen oral therapy   Anastrozole $RemoveBefo'1mg'IvNNmFiedee$  daily starting in 04/2020    07/13/2020 Survivorship   SCP delivered virtually by Cira Rue, NP       CURRENT THERAPY:  Anastrozole $RemoveBefo'1mg'FCjXnZnVHgq$  daily starting 04/2020 Zometa q43months for 2 years starting 10/06/20  INTERVAL HISTORY:  Katherine Hanna is here for a follow up of right breast cancer. She was last seen by me 6 months ago and seen by NP Lacie 3 months ago in interim. She presents to the clinic alone. She notes she is doing well. She notes she is now retired. She notes in the past 2 weeks her energy has significantly improved. She feels her residual fatigue was from work and radiation. She notes she is tolerating Anastrozole well with mild hot flashes. Her weight is stable and she watches what she eats. She wants to do programs to be active with. She notes right hand stiffness was present before starting anastrozole. I reviewed her medication list with her.    REVIEW OF SYSTEMS:   Constitutional: Denies fevers, chills or abnormal weight loss Eyes: Denies blurriness of vision Ears, nose, mouth,  throat, and face: Denies mucositis or sore throat Respiratory: Denies cough, dyspnea or wheezes Cardiovascular: Denies palpitation, chest discomfort or lower extremity swelling Gastrointestinal:  Denies nausea, heartburn or change in bowel habits Skin: Denies abnormal skin rashes Lymphatics: Denies new lymphadenopathy or easy bruising Neurological:Denies numbness, tingling or new weaknesses Behavioral/Psych: Mood is stable, no new changes  All other systems were reviewed with the patient and are negative.  MEDICAL HISTORY:  Past Medical History:  Diagnosis Date  . Allergic rhinitis   . Arthritis    knee  . Asthma   . Breast cancer ([place])   . History of chicken pox   . Migraine   . Rheumatic fever    as a child.    SURGICAL HISTORY: Past Surgical History:  Procedure Laterality Date  . APPENDECTOMY  11/1971  . BREAST LUMPECTOMY WITH RADIOACTIVE SEED AND SENTINEL LYMPH NODE BIOPSY Right 01/20/2020   Procedure: RIGHT BREAST LUMPECTOMY WITH RADIOACTIVE SEED AND SENTINEL LYMPH NODE BIOPSY;  Surgeon: Stark Klein, MD;  Location: [facility];  Service: General;  Laterality: Right;  . COLONOSCOPY    . TONSILLECTOMY     when she was 2 or 3    I have reviewed the social history and family history with the patient and they are unchanged from previous  note.  ALLERGIES:  is allergic to penicillins.  MEDICATIONS:  Current Outpatient Medications  Medication Sig Dispense Refill  . anastrozole (ARIMIDEX) 1 MG tablet Take 1 tablet (1 mg total) by mouth daily. 90 tablet 3  . Calcium Carbonate-Vitamin D (CALCIUM 500 + D PO) Take 1 tablet by mouth 2 (two) times daily.    Marland Kitchen co-enzyme Q-10 30 MG capsule Take 30 mg by mouth daily.    . fexofenadine (ALLEGRA) 180 MG tablet Take 180 mg by mouth daily.    . Misc Natural Products (TART CHERRY ADVANCED PO) Take by mouth.    . Multiple Vitamin (MULTIVITAMIN) tablet Take 1 tablet by mouth daily.    . pantoprazole (PROTONIX) 40 MG tablet  Take 1 tablet (40 mg total) by mouth daily. 90 tablet 3  . TURMERIC PO Take by mouth.     No current facility-administered medications for this visit.    PHYSICAL EXAMINATION: ECOG PERFORMANCE STATUS: 0 - Asymptomatic  Vitals:   10/06/20 0839  BP: (!) 147/90  Pulse: 95  Resp: 17  Temp: 98.9 F (37.2 C)  SpO2: 99%   Filed Weights   10/06/20 0839  Weight: 179 lb 11.2 oz (81.5 kg)    GENERAL:alert, no distress and comfortable SKIN: skin color, texture, turgor are normal, no rashes or significant lesions EYES: normal, Conjunctiva are pink and non-injected, sclera clear  NECK: supple, thyroid normal size, non-tender, without nodularity LYMPH:  no palpable lymphadenopathy in the cervical, axillary  LUNGS: clear to auscultation and percussion with normal breathing effort HEART: regular rate & rhythm and no murmurs and no lower extremity edema ABDOMEN:abdomen soft, non-tender and normal bowel sounds Musculoskeletal:no cyanosis of digits and no clubbing  NEURO: alert & oriented x 3 with fluent speech, no focal motor/sensory deficits BREAST: s/p right lumpectomy: surgical incision healed well with scar tissue in ILQ (+) Skin hyperpigmentation of right breast from RT. No palpable mass, nodules or adenopathy bilaterally. Breast exam benign.   LABORATORY DATA:  I have reviewed the data as listed CBC Latest Ref Rng & Units 10/06/2020 08/05/2020 12/10/2019  WBC 4.0 - 10.5 K/uL 6.0 4.6 8.2  Hemoglobin 12.0 - 15.0 g/dL 14.1 13.4 14.1  Hematocrit 36.0 - 46.0 % 41.7 39.1 43.0  Platelets 150 - 400 K/uL 288 291.0 303     CMP Latest Ref Rng & Units 10/06/2020 08/05/2020 12/10/2019  Glucose 70 - 99 mg/dL 99 96 109(H)  BUN 8 - 23 mg/dL $Remove'18 15 17  'gCchnZK$ Creatinine 0.44 - 1.00 mg/dL 0.85 0.77 0.92  Sodium 135 - 145 mmol/L 144 139 140  Potassium 3.5 - 5.1 mmol/L 4.0 4.6 4.5  Chloride 98 - 111 mmol/L 107 103 105  CO2 22 - 32 mmol/L $RemoveB'22 26 24  'rRRFFEXA$ Calcium 8.9 - 10.3 mg/dL 10.1 9.9 10.7(H)  Total Protein 6.5 -  8.1 g/dL 7.8 6.9 8.1  Total Bilirubin 0.3 - 1.2 mg/dL 0.9 0.5 0.5  Alkaline Phos 38 - 126 U/L 118 115 140(H)  AST 15 - 41 U/L $Remo'20 19 22  'tpulz$ ALT 0 - 44 U/L $Remo'19 17 22      'qoadC$ RADIOGRAPHIC STUDIES: I have personally reviewed the radiological images as listed and agreed with the findings in the report. No results found.   ASSESSMENT & PLAN:  RASHAUNA TEP is a 65 y.o. female with   1.Malignant neoplasm of upper-inner quadrant of right breast, Stage1A,p(T1cN1aM0), ER+/PR+/HER2-, [place] Fresh -She was diagnosed in 11/2019 with 1.1cm right breast mass of Invasive ductal carcinoma with DCIS components. -  She underwent right lumpectomy and SLNB with Dr Barry Dienes on 01/20/20. Surgical path showed complete resection of 1.3cm mass with clear margins and 1/4 positive LN, HER2 negative. Her Mammaprint was low risk Luminal Type A. Adjuvant chemotherapy was not recommended given her low risk disease. She also completed adjuvant radiation.  -She did not have genetic testing.  -I started her on antiestrogen therapy with Anastrozole in 04/2020. She is tolerating well with mild hot flashes. Will watch for joint pain  -She is clinically doing well. Lab reviewed, her CBC and CMP are within normal limits. Her physical exam was unremarkable. There is no clinical concern for recurrence. -Continue surveillance. Next mammogram 10/2020.  -F/u in 6 months    2. Anxiety, Arthritis  -She was previously on Zoloft right before her cancer diagnosis.  -She mainly has arthritis in her thumbs, right hand and right knee. Pain is occasional and manageable.  -Will monitor on Anastrozole.    3. Osteoporosis  -Her 10/2017 DEXA showed osteoporosis at right femur neck with lowest T-score -2.8. -She was previously on prolia but did not tolerate it. NP Lacie recommended switch to Zometa, patient agreed. Will start Zometa q32months for 2 years today (10/06/20). She was cleared by her dentist a few days ago. We reviewed potential side effects  again today   3. Elevated Alk Phos  -Her Alk phos has been mildly elevated an trending up since 03/2018 labs based on Epic records.  -Resolved on 08/05/20 labs.    PLAN: -Continue Anastrozole  -will proceed first dose zometa today and continue every 6 months  -Mammogram in 10/2020.  -Lab and F/u in 4 months    No problem-specific Assessment & Plan notes found for this encounter.   No orders of the defined types were placed in this encounter.  All questions were answered. The patient knows to call the clinic with any problems, questions or concerns. No barriers to learning was detected. The total time spent in the appointment was 30 minutes.     Truitt Merle, MD 10/06/2020   I, Joslyn Devon, am acting as scribe for Truitt Merle, MD.   I have reviewed the above documentation for accuracy and completeness, and I agree with the above.

## 2020-10-05 ENCOUNTER — Other Ambulatory Visit: Payer: Self-pay

## 2020-10-05 DIAGNOSIS — C50211 Malignant neoplasm of upper-inner quadrant of right female breast: Secondary | ICD-10-CM

## 2020-10-05 DIAGNOSIS — Z170 Estrogen receptor positive status [ER+]: Secondary | ICD-10-CM

## 2020-10-06 ENCOUNTER — Encounter: Payer: Self-pay | Admitting: Hematology

## 2020-10-06 ENCOUNTER — Other Ambulatory Visit: Payer: Self-pay

## 2020-10-06 ENCOUNTER — Inpatient Hospital Stay: Payer: Medicare HMO | Attending: Hematology | Admitting: Hematology

## 2020-10-06 ENCOUNTER — Inpatient Hospital Stay: Payer: Medicare HMO

## 2020-10-06 VITALS — BP 147/90 | HR 95 | Temp 98.9°F | Resp 17 | Wt 179.7 lb

## 2020-10-06 DIAGNOSIS — R748 Abnormal levels of other serum enzymes: Secondary | ICD-10-CM | POA: Diagnosis not present

## 2020-10-06 DIAGNOSIS — Z170 Estrogen receptor positive status [ER+]: Secondary | ICD-10-CM

## 2020-10-06 DIAGNOSIS — F419 Anxiety disorder, unspecified: Secondary | ICD-10-CM | POA: Diagnosis not present

## 2020-10-06 DIAGNOSIS — Z79811 Long term (current) use of aromatase inhibitors: Secondary | ICD-10-CM | POA: Diagnosis not present

## 2020-10-06 DIAGNOSIS — M1990 Unspecified osteoarthritis, unspecified site: Secondary | ICD-10-CM | POA: Insufficient documentation

## 2020-10-06 DIAGNOSIS — M1711 Unilateral primary osteoarthritis, right knee: Secondary | ICD-10-CM | POA: Insufficient documentation

## 2020-10-06 DIAGNOSIS — M816 Localized osteoporosis [Lequesne]: Secondary | ICD-10-CM

## 2020-10-06 DIAGNOSIS — M810 Age-related osteoporosis without current pathological fracture: Secondary | ICD-10-CM | POA: Diagnosis not present

## 2020-10-06 DIAGNOSIS — Z923 Personal history of irradiation: Secondary | ICD-10-CM | POA: Diagnosis not present

## 2020-10-06 DIAGNOSIS — R69 Illness, unspecified: Secondary | ICD-10-CM | POA: Diagnosis not present

## 2020-10-06 DIAGNOSIS — C50211 Malignant neoplasm of upper-inner quadrant of right female breast: Secondary | ICD-10-CM | POA: Insufficient documentation

## 2020-10-06 LAB — CBC WITH DIFFERENTIAL (CANCER CENTER ONLY)
Abs Immature Granulocytes: 0.01 10*3/uL (ref 0.00–0.07)
Basophils Absolute: 0 10*3/uL (ref 0.0–0.1)
Basophils Relative: 1 %
Eosinophils Absolute: 0.1 10*3/uL (ref 0.0–0.5)
Eosinophils Relative: 1 %
HCT: 41.7 % (ref 36.0–46.0)
Hemoglobin: 14.1 g/dL (ref 12.0–15.0)
Immature Granulocytes: 0 %
Lymphocytes Relative: 27 %
Lymphs Abs: 1.6 10*3/uL (ref 0.7–4.0)
MCH: 31 pg (ref 26.0–34.0)
MCHC: 33.8 g/dL (ref 30.0–36.0)
MCV: 91.6 fL (ref 80.0–100.0)
Monocytes Absolute: 0.4 10*3/uL (ref 0.1–1.0)
Monocytes Relative: 6 %
Neutro Abs: 3.9 10*3/uL (ref 1.7–7.7)
Neutrophils Relative %: 65 %
Platelet Count: 288 10*3/uL (ref 150–400)
RBC: 4.55 MIL/uL (ref 3.87–5.11)
RDW: 12.2 % (ref 11.5–15.5)
WBC Count: 6 10*3/uL (ref 4.0–10.5)
nRBC: 0 % (ref 0.0–0.2)

## 2020-10-06 LAB — CMP (CANCER CENTER ONLY)
ALT: 19 U/L (ref 0–44)
AST: 20 U/L (ref 15–41)
Albumin: 4.3 g/dL (ref 3.5–5.0)
Alkaline Phosphatase: 118 U/L (ref 38–126)
Anion gap: 15 (ref 5–15)
BUN: 18 mg/dL (ref 8–23)
CO2: 22 mmol/L (ref 22–32)
Calcium: 10.1 mg/dL (ref 8.9–10.3)
Chloride: 107 mmol/L (ref 98–111)
Creatinine: 0.85 mg/dL (ref 0.44–1.00)
GFR, Estimated: >60 mL/min (ref >60)
Glucose, Bld: 99 mg/dL (ref 70–99)
Potassium: 4 mmol/L (ref 3.5–5.1)
Sodium: 144 mmol/L (ref 135–145)
Total Bilirubin: 0.9 mg/dL (ref 0.3–1.2)
Total Protein: 7.8 g/dL (ref 6.5–8.1)

## 2020-10-06 MED ORDER — SODIUM CHLORIDE 0.9 % IV SOLN
Freq: Once | INTRAVENOUS | Status: AC
  Filled 2020-10-06: qty 250

## 2020-10-06 MED ORDER — ZOLEDRONIC ACID 4 MG/100ML IV SOLN
INTRAVENOUS | Status: AC
  Filled 2020-10-06: qty 100

## 2020-10-06 MED ORDER — ZOLEDRONIC ACID 4 MG/100ML IV SOLN
4.0000 mg | Freq: Once | INTRAVENOUS | Status: AC
  Administered 2020-10-06: 4 mg via INTRAVENOUS

## 2020-10-06 NOTE — Patient Instructions (Signed)
Zoledronic Acid Injection (Hypercalcemia, Oncology) What is this medicine? ZOLEDRONIC ACID (ZOE le dron ik AS id) slows calcium loss from bones. It high calcium levels in the blood from some kinds of cancer. It may be used in other people at risk for bone loss. This medicine may be used for other purposes; ask your health care provider or pharmacist if you have questions. COMMON BRAND NAME(S): Zometa What should I tell my health care provider before I take this medicine? They need to know if you have any of these conditions:  cancer  dehydration  dental disease  kidney disease  liver disease  low levels of calcium in the blood  lung or breathing disease (asthma)  receiving steroids like dexamethasone or prednisone  an unusual or allergic reaction to zoledronic acid, other medicines, foods, dyes, or preservatives  pregnant or trying to get pregnant  breast-feeding How should I use this medicine? This drug is injected into a vein. It is given by a health care provider in a hospital or clinic setting. Talk to your health care provider about the use of this drug in children. Special care may be needed. Overdosage: If you think you have taken too much of this medicine contact a poison control center or emergency room at once. NOTE: This medicine is only for you. Do not share this medicine with others. What if I miss a dose? Keep appointments for follow-up doses. It is important not to miss your dose. Call your health care provider if you are unable to keep an appointment. What may interact with this medicine?  certain antibiotics given by injection  NSAIDs, medicines for pain and inflammation, like ibuprofen or naproxen  some diuretics like bumetanide, furosemide  teriparatide  thalidomide This list may not describe all possible interactions. Give your health care provider a list of all the medicines, herbs, non-prescription drugs, or dietary supplements you use. Also tell  them if you smoke, drink alcohol, or use illegal drugs. Some items may interact with your medicine. What should I watch for while using this medicine? Visit your health care provider for regular checks on your progress. It may be some time before you see the benefit from this drug. Some people who take this drug have severe bone, joint, or muscle pain. This drug may also increase your risk for jaw problems or a broken thigh bone. Tell your health care provider right away if you have severe pain in your jaw, bones, joints, or muscles. Tell you health care provider if you have any pain that does not go away or that gets worse. Tell your dentist and dental surgeon that you are taking this drug. You should not have major dental surgery while on this drug. See your dentist to have a dental exam and fix any dental problems before starting this drug. Take good care of your teeth while on this drug. Make sure you see your dentist for regular follow-up appointments. You should make sure you get enough calcium and vitamin D while you are taking this drug. Discuss the foods you eat and the vitamins you take with your health care provider. Check with your health care provider if you have severe diarrhea, nausea, and vomiting, or if you sweat a lot. The loss of too much body fluid may make it dangerous for you to take this drug. You may need blood work done while you are taking this drug. Do not become pregnant while taking this drug. Women should inform their health care provider   if they wish to become pregnant or think they might be pregnant. There is potential for serious harm to an unborn child. Talk to your health care provider for more information. What side effects may I notice from receiving this medicine? Side effects that you should report to your doctor or health care provider as soon as possible:  allergic reactions (skin rash, itching or hives; swelling of the face, lips, or tongue)  bone  pain  infection (fever, chills, cough, sore throat, pain or trouble passing urine)  jaw pain, especially after dental work  joint pain  kidney injury (trouble passing urine or change in the amount of urine)  low blood pressure (dizziness; feeling faint or lightheaded, falls; unusually weak or tired)  low calcium levels (fast heartbeat; muscle cramps or pain; pain, tingling, or numbness in the hands or feet; seizures)  low magnesium levels (fast, irregular heartbeat; muscle cramp or pain; muscle weakness; tremors; seizures)  low red blood cell counts (trouble breathing; feeling faint; lightheaded, falls; unusually weak or tired)  muscle pain  redness, blistering, peeling, or loosening of the skin, including inside the mouth  severe diarrhea  swelling of the ankles, feet, hands  trouble breathing Side effects that usually do not require medical attention (report to your doctor or health care provider if they continue or are bothersome):  anxious  constipation  coughing  depressed mood  eye irritation, itching, or pain  fever  general ill feeling or flu-like symptoms  nausea  pain, redness, or irritation at site where injected  trouble sleeping This list may not describe all possible side effects. Call your doctor for medical advice about side effects. You may report side effects to FDA at 1-800-FDA-1088. Where should I keep my medicine? This drug is given in a hospital or clinic. It will not be stored at home. NOTE: This sheet is a summary. It may not cover all possible information. If you have questions about this medicine, talk to your doctor, pharmacist, or health care provider.  2021 Elsevier/Gold Standard (2019-02-20 09:13:00)  

## 2020-10-11 ENCOUNTER — Other Ambulatory Visit: Payer: Self-pay

## 2020-10-11 ENCOUNTER — Ambulatory Visit: Payer: Medicare HMO | Attending: Family Medicine

## 2020-10-11 DIAGNOSIS — Z483 Aftercare following surgery for neoplasm: Secondary | ICD-10-CM | POA: Insufficient documentation

## 2020-10-11 NOTE — Therapy (Signed)
[facility] [address] [place], [place], [address] Phone: [phone]   Fax:  [phone]  Physical Therapy Treatment  Patient Details  Name: Katherine Hanna MRN: [id number] Date of Birth: June 10, 1955 Referring Provider (PT): Dr. Almond Lint   Encounter Date: 10/11/2020   PT End of Session - 10/11/20 0814    Visit Number 2   # unchanged due to screen only   Number of Visits 2    PT Start Time 0808    PT Stop Time 0814    PT Time Calculation (min) 6 min    Activity Tolerance Patient tolerated treatment well    Behavior During Therapy [facility] for tasks assessed/performed           Past Medical History:  Diagnosis Date  . Allergic rhinitis   . Arthritis    knee  . Asthma   . Breast cancer ([facility])   . History of chicken pox   . Migraine   . Rheumatic fever    as a child.    Past Surgical History:  Procedure Laterality Date  . APPENDECTOMY  11/1971  . BREAST LUMPECTOMY WITH RADIOACTIVE SEED AND SENTINEL LYMPH NODE BIOPSY Right 01/20/2020   Procedure: RIGHT BREAST LUMPECTOMY WITH RADIOACTIVE SEED AND SENTINEL LYMPH NODE BIOPSY;  Surgeon: Almond Lint, MD;  Location: [facility];  Service: General;  Laterality: Right;  . COLONOSCOPY    . TONSILLECTOMY     when she was 2 or 3    There were no vitals filed for this visit.   Subjective Assessment - 10/11/20 0810    Subjective Pt returns for 3 month L-Dex screen.    Pertinent History Patient was diagnosed on 11/10/2019 with right grade II invasive ductal carcinoma breast cancer. Patient underwent a right lumpectomy and sentinel node biopsy on 01/20/2020 with 1/4 positive axillary lymph nodes. It is ER/PR positive and HER2 negative with a Ki67 of 10%.                  L-DEX FLOWSHEETS - 10/11/20 0800      L-DEX LYMPHEDEMA SCREENING   Measurement Type Unilateral    L-DEX MEASUREMENT EXTREMITY Upper Extremity    POSITION  Standing    DOMINANT SIDE  Right    At Risk Side Right    BASELINE SCORE (UNILATERAL) 4.3    L-DEX SCORE (UNILATERAL) 7.8    VALUE CHANGE (UNILAT) 3.5                                  PT Long Term Goals - 02/12/20 9059      PT LONG TERM GOAL #1   Title Patient will demonstrate she has regained full shoulder ROM and function post operatively compared to baselines.    Time 8    Period Weeks    Status Partially Met                 Plan - 10/11/20 0815    Clinical Impression Statement Pt returns for 3 minth L-Dex screen. Her change from baseline of 3.5 is WNLs so no further treatment is required at this time except to cont every 3 month L-Dex screens which pt is agreeable to.    PT Next Visit Plan Cont every 3 month L-dex screens for up to 2 years from her SLNB.    Consulted and Agree with Plan of Care Patient  Patient will benefit from skilled therapeutic intervention in order to improve the following deficits and impairments:     Visit Diagnosis: Aftercare following surgery for neoplasm     Problem List Patient Active Problem List   Diagnosis Date Noted  . Malignant neoplasm of upper-inner quadrant of right breast in female, estrogen receptor positive ([place]) 12/08/2019  . Seasonal allergies 07/30/2017  . Need for diphtheria-tetanus-pertussis (Tdap) vaccine 07/30/2017  . Vitamin D deficiency 06/28/2016  . Osteoporosis 06/18/2015  . Asthma with bronchitis 06/26/2014  . Food allergy 02/28/2012  . Depression 10/11/2010  . Preventative health care 10/05/2010  . Screening for malignant neoplasm of the cervix 10/05/2010  . COMMON MIGRAINE 08/31/2009  . ALLERGIC RHINITIS 08/31/2009  . CERVICAL POLYP 08/31/2009  . ARTHRITIS 08/31/2009  . POSTMENOPAUSAL STATUS 08/31/2009    Otelia Limes, PTA 10/11/2020, 8:16 AM  [place], [place], [address] Phone: [phone]   Fax:   [phone]  Name: Katherine Hanna MRN: [id number] Date of Birth: 12-18-1955

## 2020-10-12 ENCOUNTER — Encounter: Payer: Self-pay | Admitting: Hematology

## 2020-11-01 ENCOUNTER — Encounter: Payer: Self-pay | Admitting: Family Medicine

## 2020-11-01 DIAGNOSIS — Z853 Personal history of malignant neoplasm of breast: Secondary | ICD-10-CM

## 2020-11-03 ENCOUNTER — Other Ambulatory Visit: Payer: Self-pay | Admitting: Family Medicine

## 2020-11-03 DIAGNOSIS — E2839 Other primary ovarian failure: Secondary | ICD-10-CM

## 2020-11-09 ENCOUNTER — Other Ambulatory Visit: Payer: Self-pay | Admitting: Family Medicine

## 2020-11-09 DIAGNOSIS — E2839 Other primary ovarian failure: Secondary | ICD-10-CM

## 2020-11-16 DIAGNOSIS — D225 Melanocytic nevi of trunk: Secondary | ICD-10-CM | POA: Diagnosis not present

## 2020-11-16 DIAGNOSIS — L810 Postinflammatory hyperpigmentation: Secondary | ICD-10-CM | POA: Diagnosis not present

## 2020-12-07 DIAGNOSIS — H43312 Vitreous membranes and strands, left eye: Secondary | ICD-10-CM | POA: Diagnosis not present

## 2020-12-07 DIAGNOSIS — H5213 Myopia, bilateral: Secondary | ICD-10-CM | POA: Diagnosis not present

## 2020-12-07 DIAGNOSIS — D3131 Benign neoplasm of right choroid: Secondary | ICD-10-CM | POA: Diagnosis not present

## 2020-12-13 DIAGNOSIS — Z780 Asymptomatic menopausal state: Secondary | ICD-10-CM | POA: Diagnosis not present

## 2020-12-13 DIAGNOSIS — R922 Inconclusive mammogram: Secondary | ICD-10-CM | POA: Diagnosis not present

## 2020-12-13 DIAGNOSIS — M85852 Other specified disorders of bone density and structure, left thigh: Secondary | ICD-10-CM | POA: Diagnosis not present

## 2020-12-13 DIAGNOSIS — M85851 Other specified disorders of bone density and structure, right thigh: Secondary | ICD-10-CM | POA: Diagnosis not present

## 2020-12-13 DIAGNOSIS — Z853 Personal history of malignant neoplasm of breast: Secondary | ICD-10-CM | POA: Diagnosis not present

## 2020-12-13 LAB — HM MAMMOGRAPHY

## 2020-12-13 LAB — HM DEXA SCAN

## 2021-01-10 ENCOUNTER — Ambulatory Visit: Payer: Medicare HMO

## 2021-01-21 DIAGNOSIS — C50211 Malignant neoplasm of upper-inner quadrant of right female breast: Secondary | ICD-10-CM | POA: Diagnosis not present

## 2021-01-21 DIAGNOSIS — Z170 Estrogen receptor positive status [ER+]: Secondary | ICD-10-CM | POA: Diagnosis not present

## 2021-01-31 ENCOUNTER — Other Ambulatory Visit: Payer: Self-pay

## 2021-01-31 ENCOUNTER — Ambulatory Visit: Payer: Medicare HMO | Attending: Family Medicine

## 2021-01-31 VITALS — Wt 178.5 lb

## 2021-01-31 DIAGNOSIS — Z483 Aftercare following surgery for neoplasm: Secondary | ICD-10-CM | POA: Insufficient documentation

## 2021-01-31 NOTE — Therapy (Signed)
[place], [place], [address] Phone: [phone]   Fax:  [phone]  Physical Therapy Treatment  Patient Details  Name: Katherine Hanna MRN: [id number] Date of Birth: [birth year] Referring Provider (PT): Dr. Stark Klein   Encounter Date: 01/31/2021   PT End of Session - 01/31/21 1009     Visit Number 2   # unchanged due to screen only   Number of Visits 2    PT Start Time 1003    PT Stop Time 1009    PT Time Calculation (min) 6 min    Activity Tolerance Patient tolerated treatment well    Behavior During Therapy [facility] for tasks assessed/performed             Past Medical History:  Diagnosis Date   Allergic rhinitis    Arthritis    knee   Asthma    Breast cancer ([place])    History of chicken pox    Migraine    Rheumatic fever    as a child.    Past Surgical History:  Procedure Laterality Date   APPENDECTOMY  11/1971   BREAST LUMPECTOMY WITH RADIOACTIVE SEED AND SENTINEL LYMPH NODE BIOPSY Right 01/20/2020   Procedure: RIGHT BREAST LUMPECTOMY WITH RADIOACTIVE SEED AND SENTINEL LYMPH NODE BIOPSY;  Surgeon: Stark Klein, MD;  Location: [facility];  Service: General;  Laterality: Right;   COLONOSCOPY     TONSILLECTOMY     when she was 2 or 3    Vitals:   01/31/21 1005  Weight: 178 lb 8 oz (81 kg)     Subjective Assessment - 01/31/21 1005     Subjective Pt returns for 3 month L-Dex screen.    Pertinent History Patient was diagnosed on 11/10/2019 with right grade II invasive ductal carcinoma breast cancer. Patient underwent a right lumpectomy and sentinel node biopsy on 01/20/2020 with 1/4 positive axillary lymph nodes. It is ER/PR positive and HER2 negative with a Ki67 of 10%.                    L-DEX FLOWSHEETS - 01/31/21 1000       L-DEX LYMPHEDEMA SCREENING   Measurement Type Unilateral    L-DEX MEASUREMENT EXTREMITY Upper Extremity    POSITION  Standing     DOMINANT SIDE Right    At Risk Side Right    BASELINE SCORE (UNILATERAL) 4.3    L-DEX SCORE (UNILATERAL) 6.7    VALUE CHANGE (UNILAT) 2.4                                     PT Long Term Goals - 02/12/20 1975       PT LONG TERM GOAL #1   Title Patient will demonstrate she has regained full shoulder ROM and function post operatively compared to baselines.    Time 8    Period Weeks    Status Partially Met                   Plan - 01/31/21 1009     Clinical Impression Statement Pt returns for her 3 month L-Dex screen. Her change from baseline of 2.4 is WNLs so no further treatment is required at this time except to cont every 3 month L-Dex screens which pt is agreeable to.    PT Next Visit Plan Cont every 3 month L-dex  screens for up to 2 years from her SLNB.    Consulted and Agree with Plan of Care Patient             Patient will benefit from skilled therapeutic intervention in order to improve the following deficits and impairments:     Visit Diagnosis: Aftercare following surgery for neoplasm     Problem List Patient Active Problem List   Diagnosis Date Noted   Malignant neoplasm of upper-inner quadrant of right breast in female, estrogen receptor positive ([place]) 12/08/2019   Seasonal allergies 07/30/2017   Need for diphtheria-tetanus-pertussis (Tdap) vaccine 07/30/2017   Vitamin D deficiency 06/28/2016   Osteoporosis 06/18/2015   Asthma with bronchitis 06/26/2014   Food allergy 02/28/2012   Depression 10/11/2010   Preventative health care 10/05/2010   Screening for malignant neoplasm of the cervix 10/05/2010   COMMON MIGRAINE 08/31/2009   ALLERGIC RHINITIS 08/31/2009   CERVICAL POLYP 08/31/2009   ARTHRITIS 08/31/2009   POSTMENOPAUSAL STATUS 08/31/2009    Otelia Limes, PTA 01/31/2021, 10:11 AM  [place], [place],  [address] Phone: [phone]   Fax:  [phone]  Name: DAYAMI TAITT MRN: [id number] Date of Birth: Feb 05, 1956

## 2021-02-06 NOTE — Progress Notes (Signed)
Katherine Hanna   [phone] [phone]   Clinic Follow up Note   Patient Care Team: Carollee Herter, Alferd Apa, DO as PCP - General (Family Medicine) Sable Feil, MD as Consulting Physician (Gastroenterology) Hollar, Katharine Look, MD as Referring Physician (Dermatology) Lynnell Dike, [place] as Consulting Physician (Optometry) Mauro Kaufmann, RN as Oncology Nurse Navigator Rockwell Germany, RN as Oncology Nurse Navigator Stark Klein, MD as Consulting Physician (General Surgery) Truitt Merle, MD as Consulting Physician (Hematology) Gery Pray, MD as Consulting Physician (Radiation Oncology) Alla Feeling, NP as Nurse Practitioner (Nurse Practitioner) 02/07/2021  CHIEF COMPLAINT: Follow up right breast cancer   SUMMARY OF ONCOLOGIC HISTORY: Oncology History Overview Note  Cancer Staging Malignant neoplasm of upper-inner quadrant of right breast in female, estrogen receptor positive ([place]) Staging form: Breast, AJCC 8th Edition - Clinical stage from 12/03/2019: Stage IA (cT1c, cN0, cM0, G2, ER+, PR+, HER2-) - Signed by Truitt Merle, MD on 12/09/2019 - Pathologic stage from 01/20/2020: Stage IA (pT1c, pN1a, cM0, G2, ER+, PR+, HER2-) - Signed by Truitt Merle, MD on 04/06/2020    Malignant neoplasm of upper-inner quadrant of right breast in female, estrogen receptor positive ([place])  11/25/2019 Mammogram   Mammogram and [place] 11/25/19  IMPRESSION The 1.1x0.8x0.9cm architectural distortion in the right breast at 3:00 position middle depth 4 cm from nipple is highly suggestive of malignancy. An US guided biopsy is recommended.     12/03/2019 Initial Biopsy   Diagnosis 12/03/19 Breast, right, needle core biopsy, 3 o'clock, 4 cmfn - INVASIVE DUCTAL CARCINOMA. - DUCTAL CARCINOMA IN SITU. Microscopic Comment The carcinoma appears grade 2. The greatest linear extent of tumor in any one core is 10 mm. Ancillary studies will be reported separately. Results reported to  [facility] on 12/04/2019. Intradepartmental consultation (Dr. Melina Copa).   12/03/2019 Receptors her2   PROGNOSTIC INDICATORS Results: IMMUNOHISTOCHEMICAL AND MORPHOMETRIC ANALYSIS PERFORMED MANUALLY The tumor cells are EQUIVOCAL for Her2 (2+). Her2 by FISH will be performed and results reported separately. Estrogen Receptor: 95%, POSITIVE, STRONG STAINING INTENSITY Progesterone Receptor: 80%, POSITIVE, STRONG STAINING INTENSITY Proliferation Marker Ki67: 10%   FLUORESCENCE IN-SITU HYBRIDIZATION Results: GROUP 5: HER2 **NEGATIVE** Equivocal form of amplification of the HER2 gene was detected in the IHC 2+ tissue sample received from this individual. HER2 FISH was performed by a technologist and cell imaging and analysis on the [employer].   12/03/2019 Cancer Staging   Staging form: Breast, AJCC 8th Edition - Clinical stage from 12/03/2019: Stage IA (cT1c, cN0, cM0, G2, ER+, PR+, HER2-) - Signed by Truitt Merle, MD on 12/09/2019   12/08/2019 Initial Diagnosis   Malignant neoplasm of upper-inner quadrant of right breast in female, estrogen receptor positive ([place])   01/20/2020 Surgery   RIGHT BREAST LUMPECTOMY WITH RADIOACTIVE SEED AND SENTINEL LYMPH NODE BIOPSY by Barry Dienes    01/20/2020 Pathology Results   FINAL MICROSCOPIC DIAGNOSIS:   A. BREAST, RIGHT, LUMPECTOMY:  - Invasive ductal carcinoma, grade 2, spanning 1.3 cm.  - Intermediate grade ductal carcinoma in situ.  - Invasive carcinoma is <1 mm from the lateral margin focally.  - In situ carcinoma is 3 mm from the lateral margin focally.  - Biopsy site.  - Fibrocystic change and usual ductal hyperplasia.  - See oncology table.   B. LYMPH NODE, RIGHT AXILLARY #1, SENTINEL, EXCISION:  - Metastatic carcinoma in one of one lymph nodes (1/1).  - Focal extracapsular extension.   C. LYMPH NODE, RIGHT AXILLARY #2, SENTINEL, EXCISION:  -  One of one lymph nodes negative for carcinoma (0/1).   D. LYMPH NODE, RIGHT AXILLARY #3,  SENTINEL, EXCISION:  - One of one lymph nodes negative for carcinoma (0/1).   E. LYMPH NODE, RIGHT AXILLARY #4, SENTINEL, EXCISION:  - One of one lymph nodes negative for carcinoma (0/1).     ADDENDUM:   By immunohistochemistry, HER-2 is EQUIVOCAL (2+).  HER-2 by FISH is  pending and will be reported in an addendum.   ADDENDUM:   FLOURESCENCE IN-SITU HYBRIDIZATION RESULTS:   GROUP 5:   HER2 **NEGATIVE**    01/20/2020 Miscellaneous   Mammaprint Low risk Luminal Type A - MPI at +0.311 She has 97.8% benefit of Hor[facility] Therpay alone    01/20/2020 Cancer Staging   Staging form: Breast, AJCC 8th Edition - Pathologic stage from 01/20/2020: Stage IA (pT1c, pN1a, cM0, G2, ER+, PR+, HER2-) - Signed by Truitt Merle, MD on 04/06/2020   02/26/2020 - 04/13/2020 Radiation Therapy   Adjuvant Radiation with Dr Sondra Come    04/2020 -  Anti-estrogen oral therapy   Anastrozole $RemoveBefo'1mg'DpfnsGMgrub$  daily starting in 04/2020    07/13/2020 Survivorship   SCP delivered virtually by Cira Rue, NP      CURRENT THERAPY:  Anastrozole 1 mg daily starting 04/2020 Zometa q6 months x2 years starting 10/06/20  INTERVAL HISTORY: Ms. Katherine Hanna returns for follow up as scheduled. She was last seen by Dr. Burr Medico 10/06/20 and began Zometa. Mammogram 12/13/20 was negative. She was seen by Dr. Barry Dienes 01/21/21 and is planned for 6 month f/up.  She is doing well in general with good energy and appetite.  Tolerating anastrozole with intermittent hot flashes.  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  The side effects are tolerable.  Denies other new bone pain, concerns in her breast such as new lump/mass, nipple discharge or inversion, or skin change.  Mood is stable.  Denies changes in her oral health.  Takes calcium and vitamin D.  All other systems were reviewed with the patient and are negative.  MEDICAL HISTORY:  Past Medical History:  Diagnosis Date    Allergic rhinitis    Arthritis    knee   Asthma    Breast cancer ([place])    History of chicken pox    Migraine    Rheumatic fever    as a child.    SURGICAL HISTORY: Past Surgical History:  Procedure Laterality Date   APPENDECTOMY  11/1971   BREAST LUMPECTOMY WITH RADIOACTIVE SEED AND SENTINEL LYMPH NODE BIOPSY Right 01/20/2020   Procedure: RIGHT BREAST LUMPECTOMY WITH RADIOACTIVE SEED AND SENTINEL LYMPH NODE BIOPSY;  Surgeon: Stark Klein, MD;  Location: [facility];  Service: General;  Laterality: Right;   COLONOSCOPY     TONSILLECTOMY     when she was 2 or 3    I have reviewed the social history and family history with the patient and they are unchanged from previous note.  ALLERGIES:  is allergic to penicillins.  MEDICATIONS:  Current Outpatient Medications  Medication Sig Dispense Refill   anastrozole (ARIMIDEX) 1 MG tablet Take 1 tablet (1 mg total) by mouth daily. 90 tablet 3   Calcium Carbonate-Vitamin D (CALCIUM 500 + D PO) Take 1 tablet by mouth 2 (two) times daily.     co-enzyme Q-10 30 MG capsule Take 30 mg by mouth daily.     fexofenadine (ALLEGRA) 180 MG tablet Take  180 mg by mouth daily.     Misc Natural Products (TART CHERRY ADVANCED PO) Take by mouth.     Multiple Vitamin (MULTIVITAMIN) tablet Take 1 tablet by mouth daily.     pantoprazole (PROTONIX) 40 MG tablet Take 1 tablet (40 mg total) by mouth daily. 90 tablet 3   TURMERIC PO Take by mouth.     No current facility-administered medications for this visit.    PHYSICAL EXAMINATION: ECOG PERFORMANCE STATUS: 1 - Symptomatic but completely ambulatory  Vitals:   02/07/21 0831  BP: (!) 141/79  Pulse: 74  Resp: 18  Temp: 98.1 F (36.7 C)  SpO2: 100%   Filed Weights   02/07/21 0831  Weight: 186 lb 3.2 oz (84.5 kg)    GENERAL:alert, no distress and comfortable SKIN: No rash EYES:  sclera clear OROPHARYNX: No thrush or ulcers.  No gingival erythema or exposed bone NECK: Without  mass LYMPH:  no palpable cervical or supraclavicular lymphadenopathy  LUNGS: normal breathing effort HEART: Mild bilateral lower extremity edema NEURO: alert & oriented x 3 with fluent speech, no focal motor/sensory deficits Breast exam: Breasts are symmetric without nipple discharge or inversion.  S/p right lumpectomy and radiation.  Incisions completely healed, firm scar tissue involving the central and inner breast.  Lumpy breast tissue bilaterally without palpable mass or nodularity in either breast or axilla that I could appreciate.  LABORATORY DATA:  I have reviewed the data as listed CBC Latest Ref Rng & Units 02/07/2021 10/06/2020 08/05/2020  WBC 4.0 - 10.5 K/uL 5.3 6.0 4.6  Hemoglobin 12.0 - 15.0 g/dL 13.7 14.1 13.4  Hematocrit 36.0 - 46.0 % 39.9 41.7 39.1  Platelets 150 - 400 K/uL 275 288 291.0     CMP Latest Ref Rng & Units 02/07/2021 10/06/2020 08/05/2020  Glucose 70 - 99 mg/dL 90 99 96  BUN 8 - 23 mg/dL _0 Creatinine 0.44 - 1.00 mg/dL 0.82 0.85 0.77  Sodium 135 - 145 mmol/L 142 144 139  Potassium 3.5 - 5.1 mmol/L 3.9 4.0 4.6  Chloride 98 - 111 mmol/L 108 107 103  CO2 22 - 32 mmol/L _1 Calcium 8.9 - 10.3 mg/dL 9.7 10.1 9.9  Total Protein 6.5 - 8.1 g/dL 7.3 7.8 6.9  Total Bilirubin 0.3 - 1.2 mg/dL 0.5 0.9 0.5  Alkaline Phos 38 - 126 U/L 91 118 115  AST 15 - 41 U/L _2 ALT 0 - 44 U/L _3 RADIOGRAPHIC STUDIES: I have personally reviewed the radiological images as listed and agreed with the findings in the report. No results found.   ASSESSMENT & PLAN: Katherine Hanna is a 65 y.o. female with    1. Malignant neoplasm of upper-inner quadrant of right breast, Stage 1A, p(T1cN1aM0), ER+/PR+/HER2-, Grade II  -Diagnosed in 11/2019 with Invasive ductal carcinoma with DCIS.  S/p right lumpectomy and SLNB with Dr Barry Dienes on 01/20/20. Her Mammaprint was low risk Luminal Type A. Adjuvant chemotherapy was not recommended given her low risk disease. She also  completed adjuvant radiation.  -She did not have genetic testing.  -She began adjuvant antiestrogen therapy with Anastrozole in 04/2020. She is tolerating well with mild hot flashes and stiffness in the right hand.  Side effects are tolerable -First mammogram 12/13/2020 showed postop changes, negative for malignancy in either breast -Continue AI and surveillance  2. Anxiety, Arthritis  -She was previously on Zoloft right before her cancer diagnosis.  -She  mainly has arthritis in her thumbs, right hand and right knee. Pain is occasional and manageable.  -Right hand stiffness slightly increased on anastrozole, manageable with compression glove     3. Osteoporosis  -Her 10/2017 DEXA showed osteoporosis at right femur neck with lowest T-score -2.8. -She was previously on prolia but did not tolerate it.  Switch to Zometa every 6 months x2 years starting 10/06/2020 after receiving dental clearance  -BMD increased from osteoporosis to osteopenia at the right femur neck, T score now -2.3 -Continue calcium, vitamin D, weightbearing exercise, and Zometa   4.  Health maintenance -She is up-to-date on Pap screening, due for colonoscopy.  She will call Dr. Steve Rattler office to schedule  Disposition: Katherine Hanna is clinically doing well.  Tolerating anastrozole with mild hot flashes and right hand stiffness.  Breast exam shows scar tissue, otherwise benign.  CBC and CMP are normal.  Mammogram 12/13/2020 is negative.  Overall there is no clinical concern for breast cancer recurrence.  Continue surveillance and AI.  She will return for lab and Zometa in 2 months, follow-up in 3 months, then follow-up with Dr. Barry Dienes in 6 months (07/2021).  She continues to perform self breast exams, knows to call clinic with concerns/changes or worsening side effects from AI.  All questions were answered. The patient knows to call the clinic with any problems, questions or concerns. No barriers to learning were detected.     Alla Feeling, NP 02/07/21

## 2021-02-07 ENCOUNTER — Encounter: Payer: Self-pay | Admitting: Nurse Practitioner

## 2021-02-07 ENCOUNTER — Other Ambulatory Visit: Payer: Self-pay

## 2021-02-07 ENCOUNTER — Inpatient Hospital Stay: Payer: Medicare HMO | Admitting: Nurse Practitioner

## 2021-02-07 ENCOUNTER — Inpatient Hospital Stay: Payer: Medicare HMO | Attending: Nurse Practitioner

## 2021-02-07 ENCOUNTER — Ambulatory Visit: Payer: Medicare HMO | Admitting: Nurse Practitioner

## 2021-02-07 VITALS — BP 141/79 | HR 74 | Temp 98.1°F | Resp 18 | Ht 65.0 in | Wt 186.2 lb

## 2021-02-07 DIAGNOSIS — C50211 Malignant neoplasm of upper-inner quadrant of right female breast: Secondary | ICD-10-CM

## 2021-02-07 DIAGNOSIS — M85851 Other specified disorders of bone density and structure, right thigh: Secondary | ICD-10-CM | POA: Insufficient documentation

## 2021-02-07 DIAGNOSIS — Z79811 Long term (current) use of aromatase inhibitors: Secondary | ICD-10-CM | POA: Insufficient documentation

## 2021-02-07 DIAGNOSIS — Z170 Estrogen receptor positive status [ER+]: Secondary | ICD-10-CM | POA: Diagnosis not present

## 2021-02-07 DIAGNOSIS — Z923 Personal history of irradiation: Secondary | ICD-10-CM | POA: Diagnosis not present

## 2021-02-07 LAB — CBC WITH DIFFERENTIAL (CANCER CENTER ONLY)
Abs Immature Granulocytes: 0.01 10*3/uL (ref 0.00–0.07)
Basophils Absolute: 0 10*3/uL (ref 0.0–0.1)
Basophils Relative: 1 %
Eosinophils Absolute: 0.1 10*3/uL (ref 0.0–0.5)
Eosinophils Relative: 2 %
HCT: 39.9 % (ref 36.0–46.0)
Hemoglobin: 13.7 g/dL (ref 12.0–15.0)
Immature Granulocytes: 0 %
Lymphocytes Relative: 30 %
Lymphs Abs: 1.6 10*3/uL (ref 0.7–4.0)
MCH: 31.1 pg (ref 26.0–34.0)
MCHC: 34.3 g/dL (ref 30.0–36.0)
MCV: 90.7 fL (ref 80.0–100.0)
Monocytes Absolute: 0.4 10*3/uL (ref 0.1–1.0)
Monocytes Relative: 8 %
Neutro Abs: 3.1 10*3/uL (ref 1.7–7.7)
Neutrophils Relative %: 59 %
Platelet Count: 275 10*3/uL (ref 150–400)
RBC: 4.4 MIL/uL (ref 3.87–5.11)
RDW: 12.3 % (ref 11.5–15.5)
WBC Count: 5.3 10*3/uL (ref 4.0–10.5)
nRBC: 0 % (ref 0.0–0.2)

## 2021-02-07 LAB — CMP (CANCER CENTER ONLY)
ALT: 17 U/L (ref 0–44)
AST: 17 U/L (ref 15–41)
Albumin: 4.1 g/dL (ref 3.5–5.0)
Alkaline Phosphatase: 91 U/L (ref 38–126)
Anion gap: 10 (ref 5–15)
BUN: 12 mg/dL (ref 8–23)
CO2: 24 mmol/L (ref 22–32)
Calcium: 9.7 mg/dL (ref 8.9–10.3)
Chloride: 108 mmol/L (ref 98–111)
Creatinine: 0.82 mg/dL (ref 0.44–1.00)
GFR, Estimated: >60 mL/min (ref >60)
Glucose, Bld: 90 mg/dL (ref 70–99)
Potassium: 3.9 mmol/L (ref 3.5–5.1)
Sodium: 142 mmol/L (ref 135–145)
Total Bilirubin: 0.5 mg/dL (ref 0.3–1.2)
Total Protein: 7.3 g/dL (ref 6.5–8.1)

## 2021-03-16 DIAGNOSIS — C50919 Malignant neoplasm of unspecified site of unspecified female breast: Secondary | ICD-10-CM | POA: Diagnosis not present

## 2021-03-16 DIAGNOSIS — M1990 Unspecified osteoarthritis, unspecified site: Secondary | ICD-10-CM | POA: Diagnosis not present

## 2021-03-16 DIAGNOSIS — M8580 Other specified disorders of bone density and structure, unspecified site: Secondary | ICD-10-CM | POA: Diagnosis not present

## 2021-03-16 DIAGNOSIS — Z6830 Body mass index (BMI) 30.0-30.9, adult: Secondary | ICD-10-CM | POA: Diagnosis not present

## 2021-03-16 DIAGNOSIS — D84821 Immunodeficiency due to drugs: Secondary | ICD-10-CM | POA: Diagnosis not present

## 2021-03-16 DIAGNOSIS — J45909 Unspecified asthma, uncomplicated: Secondary | ICD-10-CM | POA: Diagnosis not present

## 2021-03-16 DIAGNOSIS — E669 Obesity, unspecified: Secondary | ICD-10-CM | POA: Diagnosis not present

## 2021-03-16 DIAGNOSIS — Z008 Encounter for other general examination: Secondary | ICD-10-CM | POA: Diagnosis not present

## 2021-03-16 DIAGNOSIS — M810 Age-related osteoporosis without current pathological fracture: Secondary | ICD-10-CM | POA: Diagnosis not present

## 2021-03-16 DIAGNOSIS — R32 Unspecified urinary incontinence: Secondary | ICD-10-CM | POA: Diagnosis not present

## 2021-03-16 DIAGNOSIS — K219 Gastro-esophageal reflux disease without esophagitis: Secondary | ICD-10-CM | POA: Diagnosis not present

## 2021-03-17 ENCOUNTER — Telehealth: Payer: Medicare HMO | Admitting: Physician Assistant

## 2021-03-17 DIAGNOSIS — B9689 Other specified bacterial agents as the cause of diseases classified elsewhere: Secondary | ICD-10-CM | POA: Diagnosis not present

## 2021-03-17 DIAGNOSIS — J208 Acute bronchitis due to other specified organisms: Secondary | ICD-10-CM

## 2021-03-17 MED ORDER — AZITHROMYCIN 250 MG PO TABS: ORAL_TABLET | ORAL | 0 refills | Status: AC

## 2021-03-17 MED ORDER — BENZONATATE 100 MG PO CAPS
100.0000 mg | ORAL_CAPSULE | Freq: Three times a day (TID) | ORAL | 0 refills | Status: DC | PRN
Start: 2021-03-17 — End: 2021-04-18

## 2021-03-17 NOTE — Progress Notes (Signed)
I have spent 5 minutes in review of e-visit questionnaire, review and updating patient chart, medical decision making and response to patient.   Ryett Hamman Cody Marquavius Scaife, PA-C    

## 2021-03-17 NOTE — Progress Notes (Signed)

## 2021-04-11 ENCOUNTER — Inpatient Hospital Stay: Payer: Medicare HMO

## 2021-04-14 ENCOUNTER — Telehealth: Payer: Medicare HMO | Admitting: Physician Assistant

## 2021-04-14 DIAGNOSIS — R059 Cough, unspecified: Secondary | ICD-10-CM

## 2021-04-14 DIAGNOSIS — J069 Acute upper respiratory infection, unspecified: Secondary | ICD-10-CM | POA: Diagnosis not present

## 2021-04-14 NOTE — Progress Notes (Signed)
Based on what you shared with me, I feel your condition warrants further evaluation and I recommend that you be seen in a face to face visit.  I feel that you need to be seen for an in person physical exam and you need to have a chest xray completed. I am concerned that your cough has been persistent and you have already been appropriately treated with antibiotics. You will need to be ruled out for pneumonia, especially given your past medical history.   NOTE: There will be NO CHARGE for this eVisit   If you are having a true medical emergency please call 911.      For an urgent face to face visit, [facility] has six urgent care centers for your convenience:     [facility] [place] [facility] [place] Get Driving Directions [phone] [place], [place] [address]    [facility]) Get Driving Directions [phone] [place], [place] [address]  [facility] [place] ([place]) Get Driving Directions [phone] [address] [place],  [place]  [address]  [facility] Get Driving Directions [phone] [address] [place], [place], [place] [address]   [facility] Get Driving Directions  [phone] [address] [place], [place] [address]   [facility] [place] Get Driving Directions [phone] [address], [place], [place] [address]  Your MyChart E-visit questionnaire answers were reviewed by a board certified advanced clinical practitioner to complete your personal care plan based on your specific symptoms.  Thank you for using e-Visits.   Approximately 5 minutes was spent documenting and reviewing patient's chart.

## 2021-04-18 ENCOUNTER — Ambulatory Visit (HOSPITAL_BASED_OUTPATIENT_CLINIC_OR_DEPARTMENT_OTHER)
Admission: RE | Admit: 2021-04-18 | Discharge: 2021-04-18 | Disposition: A | Discharge status: Home / Self Care | Payer: Medicare HMO | Source: Ambulatory Visit | Attending: Family Medicine | Admitting: Family Medicine

## 2021-04-18 ENCOUNTER — Other Ambulatory Visit: Payer: Self-pay | Admitting: Family Medicine

## 2021-04-18 ENCOUNTER — Other Ambulatory Visit: Payer: Self-pay

## 2021-04-18 ENCOUNTER — Ambulatory Visit (INDEPENDENT_AMBULATORY_CARE_PROVIDER_SITE_OTHER): Payer: Medicare HMO | Admitting: Family Medicine

## 2021-04-18 ENCOUNTER — Encounter: Payer: Self-pay | Admitting: Family Medicine

## 2021-04-18 ENCOUNTER — Ambulatory Visit: Payer: Medicare HMO

## 2021-04-18 VITALS — BP 140/84 | HR 86 | Temp 98.9°F | Resp 20 | Ht 65.0 in | Wt 180.0 lb

## 2021-04-18 DIAGNOSIS — J40 Bronchitis, not specified as acute or chronic: Secondary | ICD-10-CM | POA: Diagnosis not present

## 2021-04-18 DIAGNOSIS — R059 Cough, unspecified: Secondary | ICD-10-CM | POA: Diagnosis not present

## 2021-04-18 DIAGNOSIS — R052 Subacute cough: Secondary | ICD-10-CM

## 2021-04-18 MED ORDER — LEVOFLOXACIN 500 MG PO TABS: 500.0000 mg | ORAL_TABLET | Freq: Every day | ORAL | 0 refills | Status: AC

## 2021-04-18 MED ORDER — QVAR REDIHALER 40 MCG/ACT IN AERB: 2.0000 | INHALATION_SPRAY | Freq: Two times a day (BID) | RESPIRATORY_TRACT | 3 refills | Status: DC

## 2021-04-18 MED ORDER — ALBUTEROL SULFATE HFA 108 (90 BASE) MCG/ACT IN AERS: 2.0000 | INHALATION_SPRAY | Freq: Four times a day (QID) | RESPIRATORY_TRACT | 0 refills | Status: DC | PRN

## 2021-04-18 NOTE — Assessment & Plan Note (Signed)
Finish pred taper  levaquin for 7 days  Xray  Albuterol and qvar F/u 2 weeks or sooner prn

## 2021-04-18 NOTE — Patient Instructions (Signed)
Acute Bronchitis, Adult °Acute bronchitis is sudden inflammation of the main airways (bronchi) that come off the windpipe (trachea) in the lungs. The swelling causes the airways to get smaller and make more mucus than normal. This can make it hard to breathe and can cause coughing or noisy breathing (wheezing). °Acute bronchitis may last several weeks. The cough may last longer. Allergies, asthma, and exposure to smoke may make the condition worse. °What are the causes? °This condition can be caused by germs and by substances that irritate the lungs, including: °Cold and flu viruses. The most common cause of this condition is the virus that causes the common cold. °Bacteria. This is less common. °Breathing in substances that irritate the lungs, including: °Smoke from cigarettes and other forms of tobacco. °Dust and pollen. °Fumes from household cleaning products, gases, or burned fuel. °Indoor or outdoor air pollution. °What increases the risk? °The following factors may make you more likely to develop this condition: °A weak body's defense system, also called the immune system. °A condition that affects your lungs and breathing, such as asthma. °What are the signs or symptoms? °Common symptoms of this condition include: °Coughing. This may bring up clear, yellow, or green mucus from your lungs (sputum). °Wheezing. °Runny or stuffy nose. °Having too much mucus in your lungs (chest congestion). °Shortness of breath. °Aches and pains, including sore throat or chest. °How is this diagnosed? °This condition is usually diagnosed based on: °Your symptoms and medical history. °A physical exam. °You may also have other tests, including tests to rule out other conditions, such as pneumonia. These tests include: °A test of lung function. °Test of a mucus sample to look for the presence of bacteria. °Tests to check the oxygen level in your blood. °Blood tests. °Chest X-ray. °How is this treated? °Most cases of acute bronchitis  clear up over time without treatment. Your health care provider may recommend: °Drinking more fluids to help thin your mucus so it is easier to cough up. °Taking inhaled medicine (inhaler) to improve air flow in and out of your lungs. °Using a vaporizer or a humidifier. These are machines that add water to the air to help you breathe better. °Taking a medicine that thins mucus and clears congestion (expectorant). °Taking a medicine that prevents or stops coughing (cough suppressant). °It is notcommon to take an antibiotic medicine for this condition. °Follow these instructions at home: ° °Take over-the-counter and prescription medicines only as told by your health care provider. °Use an inhaler, vaporizer, or humidifier as told by your health care provider. °Take two teaspoons (10 mL) of honey at bedtime to lessen coughing at night. °Drink enough fluid to keep your urine pale yellow. °Do not use any products that contain nicotine or tobacco. These products include cigarettes, chewing tobacco, and vaping devices, such as e-cigarettes. If you need help quitting, ask your health care provider. °Get plenty of rest. °Return to your normal activities as told by your health care provider. Ask your health care provider what activities are safe for you. °Keep all follow-up visits. This is important. °How is this prevented? °To lower your risk of getting this condition again: °Wash your hands often with soap and water for at least 20 seconds. If soap and water are not available, use hand sanitizer. °Avoid contact with people who have cold symptoms. °Try not to touch your mouth, nose, or eyes with your hands. °Avoid breathing in smoke or chemical fumes. Breathing smoke or chemical fumes will make your condition   worse. °Get the flu shot every year. °Contact a health care provider if: °Your symptoms do not improve after 2 weeks. °You have trouble coughing up the mucus. °Your cough keeps you awake at night. °You have a  fever. °Get help right away if you: °Cough up blood. °Feel pain in your chest. °Have severe shortness of breath. °Faint or keep feeling like you are going to faint. °Have a severe headache. °Have a fever or chills that get worse. °These symptoms may represent a serious problem that is an emergency. Do not wait to see if the symptoms will go away. Get medical help right away. Call your local emergency services (911 in the [place] Do not drive yourself to the hospital. °Summary °Acute bronchitis is inflammation of the main airways (bronchi) that come off the windpipe (trachea) in the lungs. The swelling causes the airways to get smaller and make more mucus than normal. °Drinking more fluids can help thin your mucus so it is easier to cough up. °Take over-the-counter and prescription medicines only as told by your health care provider. °Do not use any products that contain nicotine or tobacco. These products include cigarettes, chewing tobacco, and vaping devices, such as e-cigarettes. If you need help quitting, ask your health care provider. °Contact a health care provider if your symptoms do not improve after 2 weeks. °This information is not intended to replace advice given to you by your health care provider. Make sure you discuss any questions you have with your health care provider. °Document Revised: 09/08/2020 Document Reviewed: 09/08/2020 °Elsevier Patient Education © 2022 [employer] ° °

## 2021-04-18 NOTE — Progress Notes (Signed)
Established Patient Office Visit  Subjective:  Patient ID: Katherine Hanna, female    DOB: 08/29/1955  Age: 65 y.o. MRN: [id number]  CC:  Chief Complaint  Patient presents with   Bronchitis    Pt states cough is not better and is wheezing. Pt states having pain from her right shoulder done to her middle back.    Follow-up    HPI Katherine Hanna presents for cough x several weeks and bronchitis.  She has been on z pak 2x and pred taper .   She c/o pain from r shoulder down to mid back.---  she is still wheezing   no fevers      Past Medical History:  Diagnosis Date   Allergic rhinitis    Arthritis    knee   Asthma    Breast cancer ([place])    History of chicken pox    Migraine    Rheumatic fever    as a child.    Past Surgical History:  Procedure Laterality Date   APPENDECTOMY  11/1971   BREAST LUMPECTOMY WITH RADIOACTIVE SEED AND SENTINEL LYMPH NODE BIOPSY Right 01/20/2020   Procedure: RIGHT BREAST LUMPECTOMY WITH RADIOACTIVE SEED AND SENTINEL LYMPH NODE BIOPSY;  Surgeon: Stark Klein, MD;  Location: [facility];  Service: General;  Laterality: Right;   COLONOSCOPY     TONSILLECTOMY     when she was 62 or 3    Family History  Problem Relation Age of Onset   Coronary artery disease Maternal Grandfather    Diabetes Father    Stroke Father    Cancer Father        bladder   Leukemia Father    Stroke Maternal Grandmother        11s   Stroke Paternal Grandmother    Lung cancer Mother    Colon cancer Maternal Uncle    Breast cancer Paternal Aunt    Esophageal cancer Neg Hx     Social History   Socioeconomic History   Marital status: Married    Spouse name: Not on file   Number of children: 2   Years of education: Not on file   Highest education level: Not on file  Occupational History   Occupation: Engineer, maintenance (IT)  Tobacco Use   Smoking status: Never   Smokeless tobacco: Never  Vaping Use   Vaping Use: Never used  Substance and Sexual Activity   Alcohol  use: No   Drug use: No   Sexual activity: Yes    Partners: Male  Other Topics Concern   Not on file  Social History Narrative   Not on file   Social Determinants of Health   Financial Resource Strain: Not on file  Food Insecurity: Not on file  Transportation Needs: Not on file  Physical Activity: Not on file  Stress: Not on file  Social Connections: Not on file  Intimate Partner Violence: Not on file    Outpatient Medications Prior to Visit  Medication Sig Dispense Refill   anastrozole (ARIMIDEX) 1 MG tablet Take 1 tablet (1 mg total) by mouth daily. 90 tablet 3   Calcium Carbonate-Vitamin D (CALCIUM 500 + D PO) Take 1 tablet by mouth 2 (two) times daily.     co-enzyme Q-10 30 MG capsule Take 30 mg by mouth daily.     fexofenadine (ALLEGRA) 180 MG tablet Take 180 mg by mouth daily.     guaiFENesin-codeine 100-10 MG/5ML syrup Take 5 mLs by mouth every  6 (six) hours as needed for cough. 120 mL 0   Misc Natural Products (TART CHERRY ADVANCED PO) Take by mouth.     Multiple Vitamin (MULTIVITAMIN) tablet Take 1 tablet by mouth daily.     pantoprazole (PROTONIX) 40 MG tablet Take 1 tablet (40 mg total) by mouth daily. 90 tablet 3   TURMERIC PO Take by mouth.     benzonatate (TESSALON) 100 MG capsule Take 1 capsule (100 mg total) by mouth 3 (three) times daily as needed for cough. (Patient not taking: Reported on 04/18/2021) 30 capsule 0   No facility-administered medications prior to visit.    Allergies  Allergen Reactions   Penicillins Rash    Rash head to toe    ROS Review of Systems  Constitutional:  Negative for appetite change, diaphoresis, fatigue and unexpected weight change.  Eyes:  Negative for pain, redness and visual disturbance.  Respiratory:  Positive for cough and wheezing. Negative for chest tightness and shortness of breath.   Cardiovascular:  Negative for chest pain, palpitations and leg swelling.  Endocrine: Negative for cold intolerance, heat intolerance,  polydipsia, polyphagia and polyuria.  Genitourinary:  Negative for difficulty urinating, dysuria and frequency.  Neurological:  Negative for dizziness, light-headedness, numbness and headaches.     Objective:    Physical Exam Vitals and nursing note reviewed.  Constitutional:      Appearance: She is well-developed.  HENT:     Head: Normocephalic and atraumatic.  Eyes:     Conjunctiva/sclera: Conjunctivae normal.  Neck:     Thyroid: No thyromegaly.     Vascular: No carotid bruit or JVD.  Cardiovascular:     Rate and Rhythm: Normal rate and regular rhythm.     Heart sounds: Normal heart sounds. No murmur heard. Pulmonary:     Effort: Pulmonary effort is normal. No respiratory distress.     Breath sounds: Wheezing and rhonchi present. No rales.  Chest:     Chest wall: No tenderness.  Musculoskeletal:     Cervical back: Normal range of motion and neck supple.  Neurological:     Mental Status: She is alert and oriented to person, place, and time.    BP 140/84 (BP Location: Left Arm, Patient Position: Sitting, Cuff Size: Normal)   Pulse 86   Temp 98.9 F (37.2 C) (Oral)   Resp 20   Ht 5\' 5"  (1.651 m)   Wt 180 lb (81.6 kg)   SpO2 95%   BMI 29.95 kg/m  Wt Readings from Last 3 Encounters:  04/18/21 180 lb (81.6 kg)  02/07/21 186 lb 3.2 oz (84.5 kg)  01/31/21 178 lb 8 oz (81 kg)     Health Maintenance Due  Topic Date Due   URINE MICROALBUMIN  Never done   Pneumonia Vaccine 55+ Years old (2 - PCV) 06/17/2016   COLONOSCOPY (Pts 45-88yrs Insurance coverage will need to be confirmed)  10/12/2019   COVID-19 Vaccine (4 - Booster for Moderna series) 05/14/2020    There are no preventive care reminders to display for this patient.  Lab Results  Component Value Date   TSH 1.35 08/04/2019   Lab Results  Component Value Date   WBC 5.3 02/07/2021   HGB 13.7 02/07/2021   HCT 39.9 02/07/2021   MCV 90.7 02/07/2021   PLT 275 02/07/2021   Lab Results  Component Value Date    NA 142 02/07/2021   K 3.9 02/07/2021   CO2 24 02/07/2021   GLUCOSE 90 02/07/2021  BUN 12 02/07/2021   CREATININE 0.82 02/07/2021   BILITOT 0.5 02/07/2021   ALKPHOS 91 02/07/2021   AST 17 02/07/2021   ALT 17 02/07/2021   PROT 7.3 02/07/2021   ALBUMIN 4.1 02/07/2021   CALCIUM 9.7 02/07/2021   ANIONGAP 10 02/07/2021   GFR 80.36 08/05/2020   Lab Results  Component Value Date   CHOL 231 (H) 08/05/2020   Lab Results  Component Value Date   HDL 50.70 08/05/2020   Lab Results  Component Value Date   LDLCALC 158 (H) 08/05/2020   Lab Results  Component Value Date   TRIG 109.0 08/05/2020   Lab Results  Component Value Date   CHOLHDL 5 08/05/2020   Lab Results  Component Value Date   HGBA1C 5.7 08/05/2020      Assessment & Plan:   Problem List Items Addressed This Visit       Unprioritized   Bronchitis - Primary    Finish pred taper  levaquin for 7 days  Xray  Albuterol and qvar F/u 2 weeks or sooner prn       Relevant Medications   albuterol (VENTOLIN HFA) 108 (90 Base) MCG/ACT inhaler   beclomethasone (QVAR REDIHALER) 40 MCG/ACT inhaler   levofloxacin (LEVAQUIN) 500 MG tablet   guaiFENesin-codeine 100-10 MG/5ML syrup   Other Relevant Orders   CBC with Differential/Platelet   Comprehensive metabolic panel   DG Chest 2 View (Completed)   CBC with Differential/Platelet   Comprehensive metabolic panel    Meds ordered this encounter  Medications   albuterol (VENTOLIN HFA) 108 (90 Base) MCG/ACT inhaler    Sig: Inhale 2 puffs into the lungs every 6 (six) hours as needed for wheezing or shortness of breath.    Dispense:  8 g    Refill:  0   beclomethasone (QVAR REDIHALER) 40 MCG/ACT inhaler    Sig: Inhale 2 puffs into the lungs 2 (two) times daily.    Dispense:  1 each    Refill:  3   levofloxacin (LEVAQUIN) 500 MG tablet    Sig: Take 1 tablet (500 mg total) by mouth daily for 7 days.    Dispense:  7 tablet    Refill:  0    Follow-up: Return in  about 2 weeks (around 05/02/2021), or if symptoms worsen or fail to improve.    Ann Held, DO

## 2021-04-19 ENCOUNTER — Other Ambulatory Visit: Payer: Self-pay | Admitting: Family Medicine

## 2021-04-19 ENCOUNTER — Ambulatory Visit (HOSPITAL_BASED_OUTPATIENT_CLINIC_OR_DEPARTMENT_OTHER)
Admission: RE | Admit: 2021-04-19 | Discharge: 2021-04-19 | Disposition: A | Discharge status: Home / Self Care | Payer: Medicare HMO | Source: Ambulatory Visit | Attending: Family Medicine | Admitting: Family Medicine

## 2021-04-19 DIAGNOSIS — R052 Subacute cough: Secondary | ICD-10-CM | POA: Insufficient documentation

## 2021-04-19 DIAGNOSIS — R911 Solitary pulmonary nodule: Secondary | ICD-10-CM | POA: Diagnosis not present

## 2021-04-19 DIAGNOSIS — I700 Atherosclerosis of aorta: Secondary | ICD-10-CM | POA: Diagnosis not present

## 2021-04-19 LAB — COMPREHENSIVE METABOLIC PANEL
ALT: 16 U/L (ref 0–35)
AST: 20 U/L (ref 0–37)
Albumin: 4.5 g/dL (ref 3.5–5.2)
Alkaline Phosphatase: 81 U/L (ref 39–117)
BUN: 19 mg/dL (ref 6–23)
CO2: 26 mEq/L (ref 19–32)
Calcium: 9.7 mg/dL (ref 8.4–10.5)
Chloride: 103 mEq/L (ref 96–112)
Creatinine, Ser: 0.87 mg/dL (ref 0.40–1.20)
GFR: 69.06 mL/min (ref >60.00)
Glucose, Bld: 100 mg/dL — ABNORMAL HIGH (ref 70–99)
Potassium: 4.1 mEq/L (ref 3.5–5.1)
Sodium: 139 mEq/L (ref 135–145)
Total Bilirubin: 0.4 mg/dL (ref 0.2–1.2)
Total Protein: 7.4 g/dL (ref 6.0–8.3)

## 2021-04-19 LAB — CBC WITH DIFFERENTIAL/PLATELET
Basophils Absolute: 0 10*3/uL (ref 0.0–0.1)
Basophils Relative: 0.7 % (ref 0.0–3.0)
Eosinophils Absolute: 0 10*3/uL (ref 0.0–0.7)
Eosinophils Relative: 0.1 % (ref 0.0–5.0)
HCT: 40.5 % (ref 36.0–46.0)
Hemoglobin: 13.7 g/dL (ref 12.0–15.0)
Lymphocytes Relative: 24.7 % (ref 12.0–46.0)
Lymphs Abs: 1.6 10*3/uL (ref 0.7–4.0)
MCHC: 33.9 g/dL (ref 30.0–36.0)
MCV: 92.9 fl (ref 78.0–100.0)
Monocytes Absolute: 0.5 10*3/uL (ref 0.1–1.0)
Monocytes Relative: 7.7 % (ref 3.0–12.0)
Neutro Abs: 4.2 10*3/uL (ref 1.4–7.7)
Neutrophils Relative %: 66.8 % (ref 43.0–77.0)
Platelets: 320 10*3/uL (ref 150.0–400.0)
RBC: 4.36 Mil/uL (ref 3.87–5.11)
RDW: 13.9 % (ref 11.5–15.5)
WBC: 6.3 10*3/uL (ref 4.0–10.5)

## 2021-04-19 NOTE — Telephone Encounter (Signed)
Per pharmacy medication is non formulary

## 2021-05-02 ENCOUNTER — Other Ambulatory Visit: Payer: Self-pay

## 2021-05-02 ENCOUNTER — Ambulatory Visit: Payer: Medicare HMO | Attending: Family Medicine

## 2021-05-02 VITALS — Wt 182.2 lb

## 2021-05-02 DIAGNOSIS — Z483 Aftercare following surgery for neoplasm: Secondary | ICD-10-CM | POA: Insufficient documentation

## 2021-05-02 NOTE — Therapy (Signed)
[facility] @ [address], [place], [address] Phone: [phone]   Fax:  [phone]  Physical Therapy Treatment  Patient Details  Name: MARTIZA SPETH MRN: [id number] Date of Birth: [birth year] No data recorded  Encounter Date: 05/02/2021   PT End of Session - 05/02/21 0816     Visit Number 2   # unchanged due to screen only   PT Start Time 0812    PT Stop Time 0818    PT Time Calculation (min) 6 min    Activity Tolerance Patient tolerated treatment well    Behavior During Therapy WFL for tasks assessed/performed             Past Medical History:  Diagnosis Date   Allergic rhinitis    Arthritis    knee   Asthma    Breast cancer ([place])    History of chicken pox    Migraine    Rheumatic fever    as a child.    Past Surgical History:  Procedure Laterality Date   APPENDECTOMY  11/1971   BREAST LUMPECTOMY WITH RADIOACTIVE SEED AND SENTINEL LYMPH NODE BIOPSY Right 01/20/2020   Procedure: RIGHT BREAST LUMPECTOMY WITH RADIOACTIVE SEED AND SENTINEL LYMPH NODE BIOPSY;  Surgeon: Stark Klein, MD;  Location: [facility];  Service: General;  Laterality: Right;   COLONOSCOPY     TONSILLECTOMY     when she was 2 or 3    Vitals:   05/02/21 0814  Weight: 182 lb 4 oz (82.7 kg)     Subjective Assessment - 05/02/21 0814     Subjective Pt returns for 3 month L-Dex screen.    Pertinent History Patient was diagnosed on 11/10/2019 with right grade II invasive ductal carcinoma breast cancer. Patient underwent a right lumpectomy and sentinel node biopsy on 01/20/2020 with 1/4 positive axillary lymph nodes. It is ER/PR positive and HER2 negative with a Ki67 of 10%.                    L-DEX FLOWSHEETS - 05/02/21 0800       L-DEX LYMPHEDEMA SCREENING   Measurement Type Unilateral    L-DEX MEASUREMENT EXTREMITY Upper Extremity    POSITION  Standing    DOMINANT SIDE Right    At Risk Side Right     BASELINE SCORE (UNILATERAL) 4.3    L-DEX SCORE (UNILATERAL) 6.3    VALUE CHANGE (UNILAT) 2                                     PT Long Term Goals - 02/12/20 7510       PT LONG TERM GOAL #1   Title Patient will demonstrate she has regained full shoulder ROM and function post operatively compared to baselines.    Time 8    Period Weeks    Status Partially Met                   Plan - 05/02/21 0817     Clinical Impression Statement Pt returns for her 3 month L-Dex screen. Her change from baseline of 2 is WNLs so no further tretament is required at this time except to cont every 3 month L-Dex screens which pt is agreeable to.    PT Next Visit Plan Cont every 3 month L-dex screens for up to 2 years from her  SLNB (~01/19/22)    Consulted and Agree with Plan of Care Patient             Patient will benefit from skilled therapeutic intervention in order to improve the following deficits and impairments:     Visit Diagnosis: Aftercare following surgery for neoplasm     Problem List Patient Active Problem List   Diagnosis Date Noted   Bronchitis 04/18/2021   Malignant neoplasm of upper-inner quadrant of right breast in female, estrogen receptor positive ([facility]) 12/08/2019   Seasonal allergies 07/30/2017   Need for diphtheria-tetanus-pertussis (Tdap) vaccine 07/30/2017   Vitamin D deficiency 06/28/2016   Osteoporosis 06/18/2015   Asthma with bronchitis 06/26/2014   Food allergy 02/28/2012   Depression 10/11/2010   Preventative health care 10/05/2010   Screening for malignant neoplasm of the cervix 10/05/2010   COMMON MIGRAINE 08/31/2009   ALLERGIC RHINITIS 08/31/2009   CERVICAL POLYP 08/31/2009   ARTHRITIS 08/31/2009   POSTMENOPAUSAL STATUS 08/31/2009    Otelia Limes, PTA 05/02/2021, 8:19 AM  [facility] @ [address] [place], [place], [address] Phone:  [phone]   Fax:  [phone]  Name: RAYLAN TROIANI MRN: [id number] Date of Birth: 1956/03/08

## 2021-05-03 ENCOUNTER — Ambulatory Visit: Payer: Medicare HMO | Admitting: Family Medicine

## 2021-05-05 ENCOUNTER — Ambulatory Visit (INDEPENDENT_AMBULATORY_CARE_PROVIDER_SITE_OTHER): Payer: Medicare HMO | Admitting: Family Medicine

## 2021-05-05 ENCOUNTER — Encounter: Payer: Self-pay | Admitting: Family Medicine

## 2021-05-05 VITALS — BP 140/88 | HR 87 | Temp 98.2°F | Resp 20 | Ht 65.0 in | Wt 181.8 lb

## 2021-05-05 DIAGNOSIS — J40 Bronchitis, not specified as acute or chronic: Secondary | ICD-10-CM | POA: Diagnosis not present

## 2021-05-05 MED ORDER — METHYLPREDNISOLONE ACETATE 80 MG/ML IJ SUSP
80.0000 mg | Freq: Once | INTRAMUSCULAR | Status: AC
Start: 2021-05-05 — End: 2021-05-05
  Administered 2021-05-05: 80 mg via INTRAMUSCULAR

## 2021-05-05 MED ORDER — LEVOFLOXACIN 500 MG PO TABS: 500.0000 mg | ORAL_TABLET | Freq: Every day | ORAL | 0 refills | Status: AC

## 2021-05-05 MED ORDER — PREDNISONE 10 MG PO TABS: ORAL_TABLET | ORAL | 0 refills | Status: DC

## 2021-05-05 NOTE — Patient Instructions (Signed)
Acute Bronchitis, Adult °Acute bronchitis is sudden inflammation of the main airways (bronchi) that come off the windpipe (trachea) in the lungs. The swelling causes the airways to get smaller and make more mucus than normal. This can make it hard to breathe and can cause coughing or noisy breathing (wheezing). °Acute bronchitis may last several weeks. The cough may last longer. Allergies, asthma, and exposure to smoke may make the condition worse. °What are the causes? °This condition can be caused by germs and by substances that irritate the lungs, including: °Cold and flu viruses. The most common cause of this condition is the virus that causes the common cold. °Bacteria. This is less common. °Breathing in substances that irritate the lungs, including: °Smoke from cigarettes and other forms of tobacco. °Dust and pollen. °Fumes from household cleaning products, gases, or burned fuel. °Indoor or outdoor air pollution. °What increases the risk? °The following factors may make you more likely to develop this condition: °A weak body's defense system, also called the immune system. °A condition that affects your lungs and breathing, such as asthma. °What are the signs or symptoms? °Common symptoms of this condition include: °Coughing. This may bring up clear, yellow, or green mucus from your lungs (sputum). °Wheezing. °Runny or stuffy nose. °Having too much mucus in your lungs (chest congestion). °Shortness of breath. °Aches and pains, including sore throat or chest. °How is this diagnosed? °This condition is usually diagnosed based on: °Your symptoms and medical history. °A physical exam. °You may also have other tests, including tests to rule out other conditions, such as pneumonia. These tests include: °A test of lung function. °Test of a mucus sample to look for the presence of bacteria. °Tests to check the oxygen level in your blood. °Blood tests. °Chest X-ray. °How is this treated? °Most cases of acute bronchitis  clear up over time without treatment. Your health care provider may recommend: °Drinking more fluids to help thin your mucus so it is easier to cough up. °Taking inhaled medicine (inhaler) to improve air flow in and out of your lungs. °Using a vaporizer or a humidifier. These are machines that add water to the air to help you breathe better. °Taking a medicine that thins mucus and clears congestion (expectorant). °Taking a medicine that prevents or stops coughing (cough suppressant). °It is notcommon to take an antibiotic medicine for this condition. °Follow these instructions at home: ° °Take over-the-counter and prescription medicines only as told by your health care provider. °Use an inhaler, vaporizer, or humidifier as told by your health care provider. °Take two teaspoons (10 mL) of honey at bedtime to lessen coughing at night. °Drink enough fluid to keep your urine pale yellow. °Do not use any products that contain nicotine or tobacco. These products include cigarettes, chewing tobacco, and vaping devices, such as e-cigarettes. If you need help quitting, ask your health care provider. °Get plenty of rest. °Return to your normal activities as told by your health care provider. Ask your health care provider what activities are safe for you. °Keep all follow-up visits. This is important. °How is this prevented? °To lower your risk of getting this condition again: °Wash your hands often with soap and water for at least 20 seconds. If soap and water are not available, use hand sanitizer. °Avoid contact with people who have cold symptoms. °Try not to touch your mouth, nose, or eyes with your hands. °Avoid breathing in smoke or chemical fumes. Breathing smoke or chemical fumes will make your condition   worse. °Get the flu shot every year. °Contact a health care provider if: °Your symptoms do not improve after 2 weeks. °You have trouble coughing up the mucus. °Your cough keeps you awake at night. °You have a  fever. °Get help right away if you: °Cough up blood. °Feel pain in your chest. °Have severe shortness of breath. °Faint or keep feeling like you are going to faint. °Have a severe headache. °Have a fever or chills that get worse. °These symptoms may represent a serious problem that is an emergency. Do not wait to see if the symptoms will go away. Get medical help right away. Call your local emergency services (911 in the [place] Do not drive yourself to the hospital. °Summary °Acute bronchitis is inflammation of the main airways (bronchi) that come off the windpipe (trachea) in the lungs. The swelling causes the airways to get smaller and make more mucus than normal. °Drinking more fluids can help thin your mucus so it is easier to cough up. °Take over-the-counter and prescription medicines only as told by your health care provider. °Do not use any products that contain nicotine or tobacco. These products include cigarettes, chewing tobacco, and vaping devices, such as e-cigarettes. If you need help quitting, ask your health care provider. °Contact a health care provider if your symptoms do not improve after 2 weeks. °This information is not intended to replace advice given to you by your health care provider. Make sure you discuss any questions you have with your health care provider. °Document Revised: 09/08/2020 Document Reviewed: 09/08/2020 °Elsevier Patient Education © 2022 [employer] ° °

## 2021-05-05 NOTE — Assessment & Plan Note (Signed)
levaquin 10 days pred taper --- depomedrol today pulm if no better

## 2021-05-05 NOTE — Progress Notes (Signed)
Subjective:   By signing my name below, I, Zite Okoli, attest that this documentation has been prepared under the direction and in the presence of Ann Held, DO. 05/05/2021   Patient ID: Katherine Hanna, female    DOB: 1955-10-03, 65 y.o.   MRN: [id number]  Chief Complaint  Patient presents with   Bronchitis    Pt states still having cough and fatigue. Pt states feeling a "rattle" in her chest when she breathes. Cough worse at night   Follow-up    HPI Patient is in today for an office visit.  Patient had come in 2 weeks earlier complaining of wheezing, coughing and right shoulder pain that radiates to the back.  Today, she reports she is still coughing and wheezing and it is getting worse. She thinks it is localized to the right side and cannot sleep on that side because it is painful. She is not using albuterol because she cannot tell the difference when she uses it. Still using fluticasone in the morning and night. She was given a 6 day taper of prednisone.   Past Medical History:  Diagnosis Date   Allergic rhinitis    Arthritis    knee   Asthma    Breast cancer ([facility])    History of chicken pox    Migraine    Rheumatic fever    as a child.    Past Surgical History:  Procedure Laterality Date   APPENDECTOMY  11/1971   BREAST LUMPECTOMY WITH RADIOACTIVE SEED AND SENTINEL LYMPH NODE BIOPSY Right 01/20/2020   Procedure: RIGHT BREAST LUMPECTOMY WITH RADIOACTIVE SEED AND SENTINEL LYMPH NODE BIOPSY;  Surgeon: Stark Klein, MD;  Location: [facility];  Service: General;  Laterality: Right;   COLONOSCOPY     TONSILLECTOMY     when she was 77 or 3    Family History  Problem Relation Age of Onset   Coronary artery disease Maternal Grandfather    Diabetes Father    Stroke Father    Cancer Father        bladder   Leukemia Father    Stroke Maternal Grandmother        6s   Stroke Paternal Grandmother    Lung cancer Mother    Colon cancer Maternal  Uncle    Breast cancer Paternal Aunt    Esophageal cancer Neg Hx     Social History   Socioeconomic History   Marital status: Married    Spouse name: Not on file   Number of children: 2   Years of education: Not on file   Highest education level: Not on file  Occupational History   Occupation: Engineer, maintenance (IT)  Tobacco Use   Smoking status: Never   Smokeless tobacco: Never  Vaping Use   Vaping Use: Never used  Substance and Sexual Activity   Alcohol use: No   Drug use: No   Sexual activity: Yes    Partners: Male  Other Topics Concern   Not on file  Social History Narrative   Not on file   Social Determinants of Health   Financial Resource Strain: Not on file  Food Insecurity: Not on file  Transportation Needs: Not on file  Physical Activity: Not on file  Stress: Not on file  Social Connections: Not on file  Intimate Partner Violence: Not on file    Outpatient Medications Prior to Visit  Medication Sig Dispense Refill   albuterol (VENTOLIN HFA) 108 (90 Base) MCG/ACT inhaler  Inhale 2 puffs into the lungs every 6 (six) hours as needed for wheezing or shortness of breath. 8 g 0   anastrozole (ARIMIDEX) 1 MG tablet Take 1 tablet (1 mg total) by mouth daily. 90 tablet 3   Calcium Carbonate-Vitamin D (CALCIUM 500 + D PO) Take 1 tablet by mouth 2 (two) times daily.     co-enzyme Q-10 30 MG capsule Take 30 mg by mouth daily.     fexofenadine (ALLEGRA) 180 MG tablet Take 180 mg by mouth daily.     fluticasone (FLOVENT HFA) 44 MCG/ACT inhaler Inhale 2 puffs into the lungs 2 (two) times daily. 1 each 5   guaiFENesin-codeine 100-10 MG/5ML syrup Take 5 mLs by mouth every 6 (six) hours as needed for cough. 120 mL 0   Misc Natural Products (TART CHERRY ADVANCED PO) Take by mouth.     Multiple Vitamin (MULTIVITAMIN) tablet Take 1 tablet by mouth daily.     pantoprazole (PROTONIX) 40 MG tablet Take 1 tablet (40 mg total) by mouth daily. 90 tablet 3   TURMERIC PO Take by mouth.     No  facility-administered medications prior to visit.    Allergies  Allergen Reactions   Penicillins Rash    Rash head to toe    Review of Systems  Constitutional:  Negative for fever.  HENT:  Negative for congestion, ear pain, hearing loss, sinus pain and sore throat.   Eyes:  Negative for blurred vision and pain.  Respiratory:  Positive for cough and wheezing. Negative for sputum production and shortness of breath.   Cardiovascular:  Negative for chest pain and palpitations.  Gastrointestinal:  Negative for blood in stool, constipation, diarrhea, nausea and vomiting.  Genitourinary:  Negative for dysuria, frequency, hematuria and urgency.  Musculoskeletal:  Negative for back pain, falls and myalgias.  Neurological:  Negative for dizziness, sensory change, loss of consciousness, weakness and headaches.  Endo/Heme/Allergies:  Negative for environmental allergies. Does not bruise/bleed easily.  Psychiatric/Behavioral:  Negative for depression and suicidal ideas. The patient is not nervous/anxious and does not have insomnia.       Objective:    Physical Exam Constitutional:      General: She is not in acute distress.    Appearance: Normal appearance. She is not ill-appearing.  HENT:     Head: Normocephalic and atraumatic.     Right Ear: External ear normal.     Left Ear: External ear normal.  Eyes:     Extraocular Movements: Extraocular movements intact.     Pupils: Pupils are equal, round, and reactive to light.  Cardiovascular:     Rate and Rhythm: Normal rate and regular rhythm.     Pulses: Normal pulses.     Heart sounds: Normal heart sounds. No murmur heard.   No gallop.  Pulmonary:     Effort: Pulmonary effort is normal. No respiratory distress.     Breath sounds: Normal breath sounds. No wheezing, rhonchi or rales.  Abdominal:     General: Bowel sounds are normal. There is no distension.     Palpations: Abdomen is soft. There is no mass.     Tenderness: There is no  abdominal tenderness. There is no guarding or rebound.     Hernia: No hernia is present.  Musculoskeletal:     Cervical back: Normal range of motion and neck supple.  Lymphadenopathy:     Cervical: No cervical adenopathy.  Skin:    General: Skin is warm and dry.  Neurological:  Mental Status: She is alert and oriented to person, place, and time.  Psychiatric:        Behavior: Behavior normal.    BP 140/88 (BP Location: Left Arm, Patient Position: Sitting, Cuff Size: Normal)    Pulse 87    Temp 98.2 F (36.8 C) (Oral)    Resp 20    Ht 5\' 5"  (1.651 m)    Wt 181 lb 12.8 oz (82.5 kg)    SpO2 98%    BMI 30.25 kg/m  Wt Readings from Last 3 Encounters:  05/05/21 181 lb 12.8 oz (82.5 kg)  05/02/21 182 lb 4 oz (82.7 kg)  04/18/21 180 lb (81.6 kg)    Diabetic Foot Exam - Simple   No data filed    Lab Results  Component Value Date   WBC 6.3 04/18/2021   HGB 13.7 04/18/2021   HCT 40.5 04/18/2021   PLT 320.0 04/18/2021   GLUCOSE 100 (H) 04/18/2021   CHOL 231 (H) 08/05/2020   TRIG 109.0 08/05/2020   HDL 50.70 08/05/2020   LDLDIRECT 129.0 08/04/2019   LDLCALC 158 (H) 08/05/2020   ALT 16 04/18/2021   AST 20 04/18/2021   NA 139 04/18/2021   K 4.1 04/18/2021   CL 103 04/18/2021   CREATININE 0.87 04/18/2021   BUN 19 04/18/2021   CO2 26 04/18/2021   TSH 1.35 08/04/2019   HGBA1C 5.7 08/05/2020    Lab Results  Component Value Date   TSH 1.35 08/04/2019   Lab Results  Component Value Date   WBC 6.3 04/18/2021   HGB 13.7 04/18/2021   HCT 40.5 04/18/2021   MCV 92.9 04/18/2021   PLT 320.0 04/18/2021   Lab Results  Component Value Date   NA 139 04/18/2021   K 4.1 04/18/2021   CO2 26 04/18/2021   GLUCOSE 100 (H) 04/18/2021   BUN 19 04/18/2021   CREATININE 0.87 04/18/2021   BILITOT 0.4 04/18/2021   ALKPHOS 81 04/18/2021   AST 20 04/18/2021   ALT 16 04/18/2021   PROT 7.4 04/18/2021   ALBUMIN 4.5 04/18/2021   CALCIUM 9.7 04/18/2021   ANIONGAP 10 02/07/2021   GFR  69.06 04/18/2021   Lab Results  Component Value Date   CHOL 231 (H) 08/05/2020   Lab Results  Component Value Date   HDL 50.70 08/05/2020   Lab Results  Component Value Date   LDLCALC 158 (H) 08/05/2020   Lab Results  Component Value Date   TRIG 109.0 08/05/2020   Lab Results  Component Value Date   CHOLHDL 5 08/05/2020   Lab Results  Component Value Date   HGBA1C 5.7 08/05/2020       Assessment & Plan:   Problem List Items Addressed This Visit       Unprioritized   Bronchitis - Primary    levaquin 10 days pred taper --- depomedrol today pulm if no better       Relevant Medications   levofloxacin (LEVAQUIN) 500 MG tablet   predniSONE (DELTASONE) 10 MG tablet   Other Relevant Orders   Ambulatory referral to Pulmonology    Meds ordered this encounter  Medications   levofloxacin (LEVAQUIN) 500 MG tablet    Sig: Take 1 tablet (500 mg total) by mouth daily for 7 days.    Dispense:  7 tablet    Refill:  0   predniSONE (DELTASONE) 10 MG tablet    Sig: TAKE 3 TABLETS PO QD FOR 3 DAYS THEN TAKE 2 TABLETS PO QD  FOR 3 DAYS THEN TAKE 1 TABLET PO QD FOR 3 DAYS THEN TAKE 1/2 TAB PO QD FOR 3 DAYS    Dispense:  20 tablet    Refill:  0   methylPREDNISolone acetate (DEPO-MEDROL) injection 80 mg    I,Zite Okoli,acting as a Education administrator for [employer], DO.,have documented all relevant documentation on the behalf of Ann Held, DO,as directed by  Ann Held, DO while in the presence of [place], DO. , personally preformed the services described in this documentation.  All medical record entries made by the scribe were at my direction and in my presence.  I have reviewed the chart and discharge instructions (if applicable) and agree that the record reflects my personal performance and is accurate and complete. 05/05/2021

## 2021-05-08 NOTE — Progress Notes (Signed)
Error

## 2021-05-09 ENCOUNTER — Inpatient Hospital Stay: Payer: Medicare HMO

## 2021-05-09 ENCOUNTER — Other Ambulatory Visit: Payer: Self-pay

## 2021-05-09 ENCOUNTER — Inpatient Hospital Stay: Payer: Medicare HMO | Attending: Nurse Practitioner

## 2021-05-09 ENCOUNTER — Inpatient Hospital Stay (HOSPITAL_BASED_OUTPATIENT_CLINIC_OR_DEPARTMENT_OTHER): Payer: Medicare HMO | Admitting: Nurse Practitioner

## 2021-05-09 ENCOUNTER — Encounter: Payer: Self-pay | Admitting: Nurse Practitioner

## 2021-05-09 ENCOUNTER — Telehealth: Payer: Self-pay | Admitting: Family Medicine

## 2021-05-09 VITALS — BP 152/78 | HR 73 | Temp 98.3°F | Resp 17 | Ht 65.0 in | Wt 179.9 lb

## 2021-05-09 DIAGNOSIS — M816 Localized osteoporosis [Lequesne]: Secondary | ICD-10-CM

## 2021-05-09 DIAGNOSIS — M810 Age-related osteoporosis without current pathological fracture: Secondary | ICD-10-CM | POA: Insufficient documentation

## 2021-05-09 DIAGNOSIS — C50211 Malignant neoplasm of upper-inner quadrant of right female breast: Secondary | ICD-10-CM | POA: Diagnosis not present

## 2021-05-09 DIAGNOSIS — J40 Bronchitis, not specified as acute or chronic: Secondary | ICD-10-CM | POA: Diagnosis not present

## 2021-05-09 DIAGNOSIS — Z170 Estrogen receptor positive status [ER+]: Secondary | ICD-10-CM | POA: Diagnosis not present

## 2021-05-09 DIAGNOSIS — F419 Anxiety disorder, unspecified: Secondary | ICD-10-CM | POA: Insufficient documentation

## 2021-05-09 DIAGNOSIS — M1711 Unilateral primary osteoarthritis, right knee: Secondary | ICD-10-CM | POA: Diagnosis not present

## 2021-05-09 DIAGNOSIS — R69 Illness, unspecified: Secondary | ICD-10-CM | POA: Diagnosis not present

## 2021-05-09 LAB — CBC WITH DIFFERENTIAL (CANCER CENTER ONLY)
Abs Immature Granulocytes: 0.03 10*3/uL (ref 0.00–0.07)
Basophils Absolute: 0 10*3/uL (ref 0.0–0.1)
Basophils Relative: 0 %
Eosinophils Absolute: 0 10*3/uL (ref 0.0–0.5)
Eosinophils Relative: 0 %
HCT: 39.6 % (ref 36.0–46.0)
Hemoglobin: 13.1 g/dL (ref 12.0–15.0)
Immature Granulocytes: 0 %
Lymphocytes Relative: 19 %
Lymphs Abs: 1.3 10*3/uL (ref 0.7–4.0)
MCH: 30.5 pg (ref 26.0–34.0)
MCHC: 33.1 g/dL (ref 30.0–36.0)
MCV: 92.3 fL (ref 80.0–100.0)
Monocytes Absolute: 0.3 10*3/uL (ref 0.1–1.0)
Monocytes Relative: 4 %
Neutro Abs: 5.4 10*3/uL (ref 1.7–7.7)
Neutrophils Relative %: 77 %
Platelet Count: 325 10*3/uL (ref 150–400)
RBC: 4.29 MIL/uL (ref 3.87–5.11)
RDW: 13.1 % (ref 11.5–15.5)
WBC Count: 7.1 10*3/uL (ref 4.0–10.5)
nRBC: 0 % (ref 0.0–0.2)

## 2021-05-09 LAB — CMP (CANCER CENTER ONLY)
ALT: 16 U/L (ref 0–44)
AST: 15 U/L (ref 15–41)
Albumin: 4.2 g/dL (ref 3.5–5.0)
Alkaline Phosphatase: 86 U/L (ref 38–126)
Anion gap: 13 (ref 5–15)
BUN: 18 mg/dL (ref 8–23)
CO2: 20 mmol/L — ABNORMAL LOW (ref 22–32)
Calcium: 9.7 mg/dL (ref 8.9–10.3)
Chloride: 109 mmol/L (ref 98–111)
Creatinine: 0.94 mg/dL (ref 0.44–1.00)
GFR, Estimated: >60 mL/min (ref >60)
Glucose, Bld: 102 mg/dL — ABNORMAL HIGH (ref 70–99)
Potassium: 3.8 mmol/L (ref 3.5–5.1)
Sodium: 142 mmol/L (ref 135–145)
Total Bilirubin: 0.4 mg/dL (ref 0.3–1.2)
Total Protein: 7.8 g/dL (ref 6.5–8.1)

## 2021-05-09 MED ORDER — ZOLEDRONIC ACID 4 MG/100ML IV SOLN
4.0000 mg | Freq: Once | INTRAVENOUS | Status: AC
  Administered 2021-05-09: 11:00:00 4 mg via INTRAVENOUS
  Filled 2021-05-09: qty 100

## 2021-05-09 MED ORDER — SODIUM CHLORIDE 0.9 % IV SOLN: Freq: Once | INTRAVENOUS | Status: AC

## 2021-05-09 MED ORDER — ANASTROZOLE 1 MG PO TABS: 1.0000 mg | ORAL_TABLET | Freq: Every day | ORAL | 3 refills | Status: DC

## 2021-05-09 NOTE — Progress Notes (Signed)
[place]   [phone] [phone]   Clinic Follow up Note   Patient Care Team: Carollee Herter, Alferd Apa, DO as PCP - General (Family Medicine) Sable Feil, MD as Consulting Physician (Gastroenterology) Hollar, Katharine Look, MD as Referring Physician (Dermatology) Lynnell Dike, OD as Consulting Physician (Optometry) Mauro Kaufmann, RN as Oncology Nurse Navigator Rockwell Germany, RN as Oncology Nurse Navigator Stark Klein, MD as Consulting Physician (General Surgery) Truitt Merle, MD as Consulting Physician (Hematology) Gery Pray, MD as Consulting Physician (Radiation Oncology) Alla Feeling, NP as Nurse Practitioner (Nurse Practitioner) 05/09/2021  CHIEF COMPLAINT: Follow-up right breast cancer  SUMMARY OF ONCOLOGIC HISTORY: Oncology History Overview Note  Cancer Staging Malignant neoplasm of upper-inner quadrant of right breast in female, estrogen receptor positive ([place]) Staging form: Breast, AJCC 8th Edition - Clinical stage from 12/03/2019: Stage IA (cT1c, cN0, cM0, G2, ER+, PR+, HER2-) - Signed by Truitt Merle, MD on 12/09/2019 - Pathologic stage from 01/20/2020: Stage IA (pT1c, pN1a, cM0, G2, ER+, PR+, HER2-) - Signed by Truitt Merle, MD on 04/06/2020    Malignant neoplasm of upper-inner quadrant of right breast in female, estrogen receptor positive ([place])  11/25/2019 Mammogram   Mammogram and [place] 11/25/19  IMPRESSION The 1.1x0.8x0.9cm architectural distortion in the right breast at 3:00 position middle depth 4 cm from nipple is highly suggestive of malignancy. An US guided biopsy is recommended.     12/03/2019 Initial Biopsy   Diagnosis 12/03/19 Breast, right, needle core biopsy, 3 o'clock, 4 cmfn - INVASIVE DUCTAL CARCINOMA. - DUCTAL CARCINOMA IN SITU. Microscopic Comment The carcinoma appears grade 2. The greatest linear extent of tumor in any one core is 10 mm. Ancillary studies will be reported separately. Results reported to  [facility] [employer] on 12/04/2019. Intradepartmental consultation (Dr. Melina Copa).   12/03/2019 Receptors her2   PROGNOSTIC INDICATORS Results: IMMUNOHISTOCHEMICAL AND MORPHOMETRIC ANALYSIS PERFORMED MANUALLY The tumor cells are EQUIVOCAL for Her2 (2+). Her2 by FISH will be performed and results reported separately. Estrogen Receptor: 95%, POSITIVE, STRONG STAINING INTENSITY Progesterone Receptor: 80%, POSITIVE, STRONG STAINING INTENSITY Proliferation Marker Ki67: 10%   FLUORESCENCE IN-SITU HYBRIDIZATION Results: GROUP 5: HER2 **NEGATIVE** Equivocal form of amplification of the HER2 gene was detected in the IHC 2+ tissue sample received from this individual. HER2 FISH was performed by a technologist and cell imaging and analysis on the [employer].   12/03/2019 Cancer Staging   Staging form: Breast, AJCC 8th Edition - Clinical stage from 12/03/2019: Stage IA (cT1c, cN0, cM0, G2, ER+, PR+, HER2-) - Signed by Truitt Merle, MD on 12/09/2019    12/08/2019 Initial Diagnosis   Malignant neoplasm of upper-inner quadrant of right breast in female, estrogen receptor positive ([place])   01/20/2020 Surgery   RIGHT BREAST LUMPECTOMY WITH RADIOACTIVE SEED AND SENTINEL LYMPH NODE BIOPSY by Barry Dienes    01/20/2020 Pathology Results   FINAL MICROSCOPIC DIAGNOSIS:   A. BREAST, RIGHT, LUMPECTOMY:  - Invasive ductal carcinoma, grade 2, spanning 1.3 cm.  - Intermediate grade ductal carcinoma in situ.  - Invasive carcinoma is <1 mm from the lateral margin focally.  - In situ carcinoma is 3 mm from the lateral margin focally.  - Biopsy site.  - Fibrocystic change and usual ductal hyperplasia.  - See oncology table.   B. LYMPH NODE, RIGHT AXILLARY #1, SENTINEL, EXCISION:  - Metastatic carcinoma in one of one lymph nodes (1/1).  - Focal extracapsular extension.   C. LYMPH NODE, RIGHT AXILLARY #2, SENTINEL, EXCISION:  -  One of one lymph nodes negative for carcinoma (0/1).  ° °D. LYMPH NODE, RIGHT AXILLARY #3,  SENTINEL, EXCISION:  °- One of one lymph nodes negative for carcinoma (0/1).  ° °E. LYMPH NODE, RIGHT AXILLARY #4, SENTINEL, EXCISION:  °- One of one lymph nodes negative for carcinoma (0/1).  ° ° ° °ADDENDUM:  ° °By immunohistochemistry, HER-2 is EQUIVOCAL (2+).  HER-2 by FISH is  °pending and will be reported in an addendum.  ° °ADDENDUM:  ° °FLOURESCENCE IN-SITU HYBRIDIZATION RESULTS:  ° °GROUP 5:   HER2 **NEGATIVE**  °  °01/20/2020 Miscellaneous  ° Mammaprint Low risk Luminal Type A - MPI at +0.311 °She has 97.8% benefit of [employer] alone  °  °01/20/2020 Cancer Staging  ° Staging form: Breast, AJCC 8th Edition °- Pathologic stage from 01/20/2020: Stage IA (pT1c, pN1a, cM0, G2, ER+, PR+, HER2-) - Signed by Feng, Yan, MD on 04/06/2020 ° °  °02/26/2020 - 04/13/2020 Radiation Therapy  ° Adjuvant Radiation with Dr Kinard  °  °04/2020 -  Anti-estrogen oral therapy  ° Anastrozole 1mg daily starting in 04/2020  °  °07/13/2020 Survivorship  ° SCP delivered virtually by Lacie Burton, NP  °  ° ° °CURRENT THERAPY:  °Anastrozole 1 mg daily starting 04/2020 °Zometa q6 months x2 years starting 10/06/20 ° °INTERVAL HISTORY:Katherine Hanna returns for follow up as scheduled. Last seen by me 02/07/21.  She developed the flu in October followed by a bad case of bronchitis, now on her fourth round of antibiotics and second prednisone taper.  She underwent chest xray 04/18/21 for persistent cough that was concerning for a lung nodule. F/up CT chest next day showed no underlying nodule or concern for malignancy.  She has been referred to pulmonology.  She continues anastrozole, tolerating well.  She has mild intermittent hot flashes and "clusters", will have some for 2-3 days then none for a week.  Denies bone or joint pain, concerns in her breast such as new lump/mass, nipple discharge or inversion, or skin change.  Denies change in bowel habits, black or bloody stool, abdominal pain or bloating, or other new concerns.  She had a dental  checkup 6 weeks ago. ° ° °MEDICAL HISTORY:  °Past Medical History:  °Diagnosis Date  ° Allergic rhinitis   ° Arthritis   ° knee  ° Asthma   ° Breast cancer (HCC)   ° History of chicken pox   ° Migraine   ° Rheumatic fever   ° as a child.  ° ° °SURGICAL HISTORY: °Past Surgical History:  °Procedure Laterality Date  ° APPENDECTOMY  11/1971  ° BREAST LUMPECTOMY WITH RADIOACTIVE SEED AND SENTINEL LYMPH NODE BIOPSY Right 01/20/2020  ° Procedure: RIGHT BREAST LUMPECTOMY WITH RADIOACTIVE SEED AND SENTINEL LYMPH NODE BIOPSY;  Surgeon: Byerly, Faera, MD;  Location: [facility];  Service: General;  Laterality: Right;  ° COLONOSCOPY    ° TONSILLECTOMY    ° when she was 2 or 3  ° ° °I have reviewed the social history and family history with the patient and they are unchanged from previous note. ° °ALLERGIES:  is allergic to penicillins. ° °MEDICATIONS:  °Current Outpatient Medications  °Medication Sig Dispense Refill  ° albuterol (VENTOLIN HFA) 108 (90 Base) MCG/ACT inhaler Inhale 2 puffs into the lungs every 6 (six) hours as needed for wheezing or shortness of breath. 8 g 0  ° anastrozole (ARIMIDEX) 1 MG tablet Take 1 tablet (1 mg total) by mouth daily. 90 tablet 3  °   Calcium Carbonate-Vitamin D (CALCIUM 500 + D PO) Take 1 tablet by mouth 2 (two) times daily.     co-enzyme Q-10 30 MG capsule Take 30 mg by mouth daily.     fexofenadine (ALLEGRA) 180 MG tablet Take 180 mg by mouth daily.     fluticasone (FLOVENT HFA) 44 MCG/ACT inhaler Inhale 2 puffs into the lungs 2 (two) times daily. 1 each 5   guaiFENesin-codeine 100-10 MG/5ML syrup Take 5 mLs by mouth every 6 (six) hours as needed for cough. 120 mL 0   levofloxacin (LEVAQUIN) 500 MG tablet Take 1 tablet (500 mg total) by mouth daily for 7 days. 7 tablet 0   Misc Natural Products (TART CHERRY ADVANCED PO) Take by mouth.     Multiple Vitamin (MULTIVITAMIN) tablet Take 1 tablet by mouth daily.     pantoprazole (PROTONIX) 40 MG tablet Take 1 tablet (40 mg  total) by mouth daily. 90 tablet 3   predniSONE (DELTASONE) 10 MG tablet TAKE 3 TABLETS PO QD FOR 3 DAYS THEN TAKE 2 TABLETS PO QD FOR 3 DAYS THEN TAKE 1 TABLET PO QD FOR 3 DAYS THEN TAKE 1/2 TAB PO QD FOR 3 DAYS 20 tablet 0   TURMERIC PO Take by mouth.     No current facility-administered medications for this visit.    PHYSICAL EXAMINATION: ECOG PERFORMANCE STATUS: 1 - Symptomatic but completely ambulatory  Vitals:   05/09/21 0953  BP: (!) 152/78  Pulse: 73  Resp: 17  Temp: 98.3 F (36.8 C)  SpO2: 99%   Filed Weights   05/09/21 0953  Weight: 179 lb 14.4 oz (81.6 kg)    GENERAL:alert, no distress and comfortable SKIN: No rash EYES: sclera clear OROPHARYNX: No thrush, ulcers, osteonecrosis of the jaw, or other obvious periodontal disease NECK: Without mass LYMPH:  no palpable cervical or supraclavicular lymphadenopathy LUNGS: clear with normal breathing effort HEART: regular rate & rhythm, no lower extremity edema ABDOMEN:abdomen soft, non-tender and normal bowel sounds Musculoskeletal: Nonfocal NEURO: alert & oriented x 3 with fluent speech, no focal motor/sensory deficits Breast exam: Breasts are symmetric without nipple discharge or inversion.  S/p right lumpectomy and radiation, incisions completely healed with firm scar tissue in the 3 o'clock position.  Generalized lumpy breast tissue without discrete mass or nodularity in either breast or axilla that I could appreciate.  LABORATORY DATA:  I have reviewed the data as listed CBC Latest Ref Rng & Units 05/09/2021 04/18/2021 02/07/2021  WBC 4.0 - 10.5 K/uL 7.1 6.3 5.3  Hemoglobin 12.0 - 15.0 g/dL 13.1 13.7 13.7  Hematocrit 36.0 - 46.0 % 39.6 40.5 39.9  Platelets 150 - 400 K/uL 325 320.0 275     CMP Latest Ref Rng & Units 05/09/2021 04/18/2021 02/07/2021  Glucose 70 - 99 mg/dL 102(H) 100(H) 90  BUN 8 - 23 mg/dL _0 Creatinine 0.44 - 1.00 mg/dL 0.94 0.87 0.82  Sodium 135 - 145 mmol/L 142 139 142  Potassium 3.5  - 5.1 mmol/L 3.8 4.1 3.9  Chloride 98 - 111 mmol/L 109 103 108  CO2 22 - 32 mmol/L 20(L) 26 24  Calcium 8.9 - 10.3 mg/dL 9.7 9.7 9.7  Total Protein 6.5 - 8.1 g/dL 7.8 7.4 7.3  Total Bilirubin 0.3 - 1.2 mg/dL 0.4 0.4 0.5  Alkaline Phos 38 - 126 U/L 86 81 91  AST 15 - 41 U/L _1 ALT 0 - 44 U/L _2 RADIOGRAPHIC STUDIES: I  have personally reviewed the radiological images as listed and agreed with the findings in the report. °No results found.  ° °ASSESSMENT & PLAN: Katherine Hanna is a 64 y.o. female with  °  °1. Malignant neoplasm of upper-inner quadrant of right breast, Stage 1A, p(T1cN1aM0), ER+/PR+/HER2-, Grade II  °-Diagnosed in 11/2019 with Invasive ductal carcinoma with DCIS.  S/p right lumpectomy and SLNB with Dr Byerly on 01/20/20. Her Mammaprint was low risk Luminal Type A. Adjuvant chemotherapy was not recommended given her low risk disease. She also completed adjuvant radiation.  °-She did not have genetic testing.  °-She began adjuvant antiestrogen therapy with Anastrozole in 04/2020. She is tolerating well with mild hot flashes and stiffness in the right hand.  Side effects are tolerable °-First mammogram 12/13/2020 showed postop changes, negative for malignancy in either breast °-Continue AI and surveillance °  °2. Anxiety, Arthritis  °-She was previously on Zoloft right before her cancer diagnosis.  °-She mainly has arthritis in her thumbs, right hand and right knee. Pain is occasional and manageable.  °-Right hand stiffness slightly increased on anastrozole, manageable with compression glove   °-denies issues today °  °3. Osteoporosis  °-Her 10/2017 DEXA showed osteoporosis at right femur neck with lowest T-score -2.8. °-She was previously on prolia but did not tolerate it.  Switch to Zometa every 6 months x2 years starting 10/06/2020 after receiving dental clearance  °-BMD increased from osteoporosis to osteopenia at the right femur neck, T score now -2.3 °-Continue calcium,  vitamin D, weightbearing exercise, and Zometa °  °4.  Health maintenance °-She is up-to-date on Pap screening, due for colonoscopy.  She will call Dr. Gupta's office to schedule ° °5. Bronchitis °-Flu in 02/2021, subsequently developed bronchitis °-s/p multiple antibiotics and prednisone tapers.  °-CT 04/19/2021 shows no true underlying pulmonary nodule, possible radiation changes in the right lung, and airway plugging in both lower lobes °-I reviewed her work-up, symptoms are improving but not resolved, she has been referred to pulmonology ° °Disposition: °Ms. Masullo is clinically doing well from a breast cancer standpoint.  Tolerating anastrozole.  Breast exam is benign, labs unremarkable.  Overall there is no clinical concern for breast cancer recurrence.  She is nearing 1.5 years from initial diagnosis, continue surveillance and AI.  Next mammogram 11/2021. ° °Continue treatment for bronchitis per PCP, I encouraged her to pursue pulmonology referral. ° °She will receive IV Zometa today as scheduled, next dose due in 6 months. ° °Follow-up in 4 months, or sooner if needed. ° °All questions were answered. The patient knows to call the clinic with any problems, questions or concerns. No barriers to learning was detected. °I spent 20 minutes counseling the patient face to face. The total time spent in the appointment was 30 minutes and more than 50% was on counseling and review of test results ° °  ° Lacie K Burton, NP °05/09/21  ° °  °

## 2021-05-09 NOTE — Patient Instructions (Signed)

## 2021-05-09 NOTE — Telephone Encounter (Signed)
Pt called and stated pharmacy below has been calling to get clarification on medication below.   predniSONE (DELTASONE) 10 MG tablet   [id number] - [place], [place] - [address]  [address] [place], [place] [address]  Phone:  [phone]  Fax:  [phone]

## 2021-05-10 NOTE — Telephone Encounter (Signed)
Pt states pharmacy told her they may be a problem with the amount of pills on rx. She was asked if they faxed over this information and she said she thought they had called, there is no documentation verifying this. She stated she needs this rx as soon as possible, please advise.

## 2021-05-11 MED ORDER — PREDNISONE 10 MG PO TABS: ORAL_TABLET | ORAL | 0 refills | Status: DC

## 2021-05-11 NOTE — Telephone Encounter (Signed)
Correct Rx sent. 

## 2021-05-31 ENCOUNTER — Other Ambulatory Visit: Payer: Self-pay

## 2021-05-31 ENCOUNTER — Encounter: Payer: Self-pay | Admitting: Pulmonary Disease

## 2021-05-31 ENCOUNTER — Ambulatory Visit: Payer: Medicare HMO | Admitting: Pulmonary Disease

## 2021-05-31 VITALS — BP 128/72 | HR 91 | Temp 98.2°F | Ht 65.0 in | Wt 184.4 lb

## 2021-05-31 DIAGNOSIS — J4520 Mild intermittent asthma, uncomplicated: Secondary | ICD-10-CM | POA: Diagnosis not present

## 2021-05-31 MED ORDER — ADVAIR HFA 115-21 MCG/ACT IN AERO: 2.0000 | INHALATION_SPRAY | Freq: Two times a day (BID) | RESPIRATORY_TRACT | 12 refills | Status: DC

## 2021-05-31 NOTE — Progress Notes (Signed)
Synopsis: Referred in January 2023 for bronchitis by Vanna Scotland, DO  Subjective:   PATIENT ID: Katherine Hanna GENDER: female DOB: 1955/09/12, MRN: [id number]   HPI  Chief Complaint  Patient presents with   Pulmonary Consult    Referred by Dr. Roma Schanz. Pt states she was dx with bronchitis in Oct 2022- has had lingering cough since then. Her cough is non prod. She had wheezing but this has resolved. She notices chest tightness if forgets her flovent inhaler. She has SOB walking long distance or running up stairs.    Katherine Hanna is a 66 year old woman, never smoker with history of breast cancer, allergic rhinitis and asthma who is referred to pulmonary clinic for bronchitis.  She reports developing upper respiratory viral infection 9 to 10 weeks ago. She tested negative for covid at that time. She thought she had the flu. She had cough, wheezing and shortness of breath along with fatigue. She was initially treated with a zpak without much improvement. She was then treated with a course of prednisone and another zpak with some improvement. She completed 2 more rounds of antibiotics and another round of prednisone with some improvement and was then placed on flovent 79mcg 2 puffs twice daily with spacer and PRN albuterol inhaler. She reports the cough is much better at this time and is not experiencing wheezing now. She does have some sinus congestion with post nasal drainage. Denies significant reflux symptoms. She is taking zyrtec for seasonal allergies.   She reports having intermittent wheezing with previous episodes of viral infections that did not take this long to resolve.   She had CT scan 11/29 which showed anterior peripheral fibrotic changes of the right upper lobe secondary to breast radiation. She has minimal mucous plugging of the right lower lobe distal airways. No opacities or infusions.   Past Medical History:  Diagnosis Date   Allergic rhinitis    Arthritis     knee   Asthma    Breast cancer ([place])    History of chicken pox    Migraine    Rheumatic fever    as a child.     Family History  Problem Relation Age of Onset   Lung cancer Mother        smoked   Diabetes Father    Stroke Father    Cancer Father        bladder   Leukemia Father    Stroke Maternal Grandmother        55s   Coronary artery disease Maternal Grandfather    Stroke Paternal Grandmother    Colon cancer Maternal Uncle    Breast cancer Paternal Aunt    Esophageal cancer Neg Hx      Social History   Socioeconomic History   Marital status: Married    Spouse name: Not on file   Number of children: 2   Years of education: Not on file   Highest education level: Not on file  Occupational History   Occupation: Engineer, maintenance (IT)  Tobacco Use   Smoking status: Never   Smokeless tobacco: Never  Vaping Use   Vaping Use: Never used  Substance and Sexual Activity   Alcohol use: No   Drug use: No   Sexual activity: Yes    Partners: Male  Other Topics Concern   Not on file  Social History Narrative   Not on file   Social Determinants of Health   Financial Resource Strain: Not  on file  Food Insecurity: Not on file  Transportation Needs: Not on file  Physical Activity: Not on file  Stress: Not on file  Social Connections: Not on file  Intimate Partner Violence: Not on file     Allergies  Allergen Reactions   Penicillins Rash    Rash head to toe     Outpatient Medications Prior to Visit  Medication Sig Dispense Refill   albuterol (VENTOLIN HFA) 108 (90 Base) MCG/ACT inhaler Inhale 2 puffs into the lungs every 6 (six) hours as needed for wheezing or shortness of breath. 8 g 0   anastrozole (ARIMIDEX) 1 MG tablet Take 1 tablet (1 mg total) by mouth daily. 90 tablet 3   Calcium Carbonate-Vitamin D (CALCIUM 500 + D PO) Take 1 tablet by mouth 2 (two) times daily.     cetirizine (ZYRTEC) 10 MG tablet Take 10 mg by mouth at bedtime.     co-enzyme Q-10 30 MG capsule Take  30 mg by mouth daily.     guaiFENesin-codeine 100-10 MG/5ML syrup Take 5 mLs by mouth every 6 (six) hours as needed for cough. 120 mL 0   Misc Natural Products (TART CHERRY ADVANCED PO) Take by mouth.     Multiple Vitamin (MULTIVITAMIN) tablet Take 1 tablet by mouth daily.     TURMERIC PO Take by mouth.     fexofenadine (ALLEGRA) 180 MG tablet Take 180 mg by mouth daily.     fluticasone (FLOVENT HFA) 44 MCG/ACT inhaler Inhale 2 puffs into the lungs 2 (two) times daily. 1 each 5   pantoprazole (PROTONIX) 40 MG tablet Take 1 tablet (40 mg total) by mouth daily. 90 tablet 3   predniSONE (DELTASONE) 10 MG tablet TAKE 3 TABLETS PO QD FOR 3 DAYS THEN TAKE 2 TABLETS PO QD FOR 3 DAYS THEN TAKE 1 TABLET PO QD FOR 3 DAYS THEN TAKE 1/2 TAB PO QD FOR 3 DAYS 24 tablet 0   No facility-administered medications prior to visit.    Review of Systems  Constitutional:  Negative for chills, fever, malaise/fatigue and weight loss.  HENT:  Positive for congestion. Negative for sinus pain and sore throat.   Eyes: Negative.   Respiratory:  Positive for cough, shortness of breath and wheezing. Negative for hemoptysis and sputum production.   Cardiovascular:  Negative for chest pain, palpitations, orthopnea, claudication and leg swelling.  Gastrointestinal:  Negative for abdominal pain, heartburn, nausea and vomiting.  Genitourinary: Negative.   Musculoskeletal:  Negative for joint pain and myalgias.  Skin:  Negative for rash.  Neurological:  Negative for weakness.  Endo/Heme/Allergies:  Positive for environmental allergies.  Psychiatric/Behavioral: Negative.     Objective:   Vitals:   05/31/21 0852  BP: 128/72  Pulse: 91  Temp: 98.2 F (36.8 C)  TempSrc: Oral  SpO2: 98%  Weight: 184 lb 6.4 oz (83.6 kg)  Height: 5\' 5"  (1.651 m)    Physical Exam Constitutional:      General: She is not in acute distress.    Appearance: She is not ill-appearing.  HENT:     Head: Normocephalic and atraumatic.  Eyes:      General: No scleral icterus.    Conjunctiva/sclera: Conjunctivae normal.     Pupils: Pupils are equal, round, and reactive to light.  Cardiovascular:     Rate and Rhythm: Normal rate and regular rhythm.     Pulses: Normal pulses.     Heart sounds: Normal heart sounds. No murmur heard. Pulmonary:  Effort: Pulmonary effort is normal.     Breath sounds: Normal breath sounds. No wheezing, rhonchi or rales.  Abdominal:     General: Bowel sounds are normal.     Palpations: Abdomen is soft.  Musculoskeletal:     Right lower leg: No edema.     Left lower leg: No edema.  Lymphadenopathy:     Cervical: No cervical adenopathy.  Skin:    General: Skin is warm and dry.  Neurological:     General: No focal deficit present.     Mental Status: She is alert.  Psychiatric:        Mood and Affect: Mood normal.        Behavior: Behavior normal.        Thought Content: Thought content normal.        Judgment: Judgment normal.   CBC    Component Value Date/Time   WBC 7.1 05/09/2021 0927   WBC 6.3 04/18/2021 1540   RBC 4.29 05/09/2021 0927   HGB 13.1 05/09/2021 0927   HCT 39.6 05/09/2021 0927   PLT 325 05/09/2021 0927   MCV 92.3 05/09/2021 0927   MCH 30.5 05/09/2021 0927   MCHC 33.1 05/09/2021 0927   RDW 13.1 05/09/2021 0927   LYMPHSABS 1.3 05/09/2021 0927   MONOABS 0.3 05/09/2021 0927   EOSABS 0.0 05/09/2021 0927   BASOSABS 0.0 05/09/2021 0927   BMP Latest Ref Rng & Units 05/09/2021 04/18/2021 02/07/2021  Glucose 70 - 99 mg/dL 102(H) 100(H) 90  BUN 8 - 23 mg/dL 18 19 12   Creatinine 0.44 - 1.00 mg/dL 0.94 0.87 0.82  Sodium 135 - 145 mmol/L 142 139 142  Potassium 3.5 - 5.1 mmol/L 3.8 4.1 3.9  Chloride 98 - 111 mmol/L 109 103 108  CO2 22 - 32 mmol/L 20(L) 26 24  Calcium 8.9 - 10.3 mg/dL 9.7 9.7 9.7   Chest imaging: CT Chest 04/19/21 1. The density of concern on chest radiography corresponds to some sclerotic spurring of the left anterior first rib costochondral junction.  There is no true underlying pulmonary nodule in this vicinity. 2. Hazy interstitial accentuation anteriorly in the right lung is likely primarily related to prior radiation therapy given the other findings of prior breast surgery and right axillary dissection. 3. Minimal airway plugging in both lower lobes. 4.  Aortic Atherosclerosis  PFT: No flowsheet data found.  Labs:  Path:  Echo:  Heart Catheterization:  Assessment & Plan:   Mild intermittent reactive airway disease without complication - Plan: fluticasone-salmeterol (ADVAIR HFA) 115-21 MCG/ACT inhaler  Discussion: Katherine Hanna is a 66 year old woman, never smoker with history of breast cancer, allergic rhinitis and asthma who is referred to pulmonary clinic for bronchitis.  She has reactive airways disease secondary to upper viral infection and she appears to have history of this with prior infections.   She is to stop the flovent inhaler and use advair HFA 115-104mcg 2 puffs twice daily with spacer. I have instructed her to wean off inhaler if she feels her symptoms have resolved over the next couple of months. If symptoms return once stopping the inhaler, she is to resume the medication.   We will consider pulmonary function testing in the future.   Follow up in 3 months.  Freda Jackson, MD [facility] Office: [phone]    Current Outpatient Medications:    albuterol (VENTOLIN HFA) 108 (90 Base) MCG/ACT inhaler, Inhale 2 puffs into the lungs every 6 (six) hours as needed  for wheezing or shortness of breath., Disp: 8 g, Rfl: 0   anastrozole (ARIMIDEX) 1 MG tablet, Take 1 tablet (1 mg total) by mouth daily., Disp: 90 tablet, Rfl: 3   Calcium Carbonate-Vitamin D (CALCIUM 500 + D PO), Take 1 tablet by mouth 2 (two) times daily., Disp: , Rfl:    cetirizine (ZYRTEC) 10 MG tablet, Take 10 mg by mouth at bedtime., Disp: , Rfl:    co-enzyme Q-10 30 MG capsule, Take 30 mg by mouth daily., Disp: ,  Rfl:    fluticasone-salmeterol (ADVAIR HFA) 115-21 MCG/ACT inhaler, Inhale 2 puffs into the lungs 2 (two) times daily., Disp: 1 each, Rfl: 12   guaiFENesin-codeine 100-10 MG/5ML syrup, Take 5 mLs by mouth every 6 (six) hours as needed for cough., Disp: 120 mL, Rfl: 0   Misc Natural Products (TART CHERRY ADVANCED PO), Take by mouth., Disp: , Rfl:    Multiple Vitamin (MULTIVITAMIN) tablet, Take 1 tablet by mouth daily., Disp: , Rfl:    TURMERIC PO, Take by mouth., Disp: , Rfl:

## 2021-05-31 NOTE — Patient Instructions (Addendum)
It appears that you have reactive airways disease after recent upper respiratory viral infection.  Stop flovent inhaler  Start advair inhaler 2 puffs twice daily with spacer - rinse mouth out after each use  Use albuterol inhaler as needed.  Continue zyrtec for allergies

## 2021-06-08 ENCOUNTER — Encounter (HOSPITAL_COMMUNITY): Payer: Self-pay

## 2021-07-22 DIAGNOSIS — Z170 Estrogen receptor positive status [ER+]: Secondary | ICD-10-CM | POA: Diagnosis not present

## 2021-07-22 DIAGNOSIS — C50211 Malignant neoplasm of upper-inner quadrant of right female breast: Secondary | ICD-10-CM | POA: Diagnosis not present

## 2021-08-01 ENCOUNTER — Other Ambulatory Visit: Payer: Self-pay

## 2021-08-01 ENCOUNTER — Ambulatory Visit: Payer: Medicare HMO | Attending: Family Medicine

## 2021-08-01 VITALS — Wt 188.0 lb

## 2021-08-01 DIAGNOSIS — Z483 Aftercare following surgery for neoplasm: Secondary | ICD-10-CM | POA: Insufficient documentation

## 2021-08-01 NOTE — Therapy (Signed)
[facility] ?[place], [place], [address] ?Phone: [phone]   Fax:  [phone] ? ?Physical Therapy Treatment ? ?Patient Details  ?Name: Katherine Hanna ?MRN: [id number] ?Date of Birth: Aug 01, 1955 ?No data recorded ? ?Encounter Date: 08/01/2021 ? ? PT End of Session - 08/01/21 0806   ? ? Visit Number 2   # unchnaged due to screen only  ? PT Start Time 0801   ? PT Stop Time 0623   ? PT Time Calculation (min) 6 min   ? Behavior During Therapy [facility] for tasks assessed/performed   ? ?  ?  ? ?  ? ? ?Past Medical History:  ?Diagnosis Date  ? Allergic rhinitis   ? Arthritis   ? knee  ? Asthma   ? Breast cancer ([place])   ? History of chicken pox   ? Migraine   ? Rheumatic fever   ? as a child.  ? ? ?Past Surgical History:  ?Procedure Laterality Date  ? APPENDECTOMY  11/1971  ? BREAST LUMPECTOMY WITH RADIOACTIVE SEED AND SENTINEL LYMPH NODE BIOPSY Right 01/20/2020  ? Procedure: RIGHT BREAST LUMPECTOMY WITH RADIOACTIVE SEED AND SENTINEL LYMPH NODE BIOPSY;  Surgeon: Stark Klein, MD;  Location: [facility];  Service: General;  Laterality: Right;  ? COLONOSCOPY    ? TONSILLECTOMY    ? when she was 2 or 3  ? ? ?Vitals:  ? 08/01/21 0804  ?Weight: 188 lb (85.3 kg)  ? ? ? Subjective Assessment - 08/01/21 0803   ? ? Subjective Pt returns for 3 month L-Dex screen.   ? Pertinent History Patient was diagnosed on 11/10/2019 with right grade II invasive ductal carcinoma breast cancer. Patient underwent a right lumpectomy and sentinel node biopsy on 01/20/2020 with 1/4 positive axillary lymph nodes. It is ER/PR positive and HER2 negative with a Ki67 of 10%.   ? ?  ?  ? ?  ? ? ? ? ? ? ? ? ? L-DEX FLOWSHEETS - 08/01/21 0800   ? ?  ? L-DEX LYMPHEDEMA SCREENING  ? Measurement Type Unilateral   ? L-DEX MEASUREMENT EXTREMITY Upper Extremity   ? POSITION  Standing   ? DOMINANT SIDE Right   ? At Risk Side Right   ? BASELINE SCORE (UNILATERAL) 4.3   ? L-DEX SCORE  (UNILATERAL) 9.7   ? VALUE CHANGE (UNILAT) 5.4   ? ?  ?  ? ?  ? ? ? ? ? ? ? ? ? ? ? ? ? ? ? ? ? ? ? ? ? ? ? ? ? ? PT Long Term Goals - 02/12/20 0939   ? ?  ? PT LONG TERM GOAL #1  ? Title Patient will demonstrate she has regained full shoulder ROM and function post operatively compared to baselines.   ? Time 8   ? Period Weeks   ? Status Partially Met   ? ?  ?  ? ?  ? ? ? ? ? ? ? ? Plan - 08/01/21 0805   ? ? Clinical Impression Statement Pt returns for her 3 month L-Dex screen. Her change from baseline of 5.4 is WNLs so no further treatment is required at this time except to cont every 3 month L-Dex screens which pt is agreeable to.   ? PT Next Visit Plan Cont every 3 month L-dex screens for up to 2 years from her [facility] (~01/19/22)   ? Consulted and Agree with Plan of  Care Patient   ? ?  ?  ? ?  ? ? ?Patient will benefit from skilled therapeutic intervention in order to improve the following deficits and impairments:    ? ?Visit Diagnosis: ?Aftercare following surgery for neoplasm ? ? ? ? ?Problem List ?Patient Active Problem List  ? Diagnosis Date Noted  ? Bronchitis 04/18/2021  ? Malignant neoplasm of upper-inner quadrant of right breast in female, estrogen receptor positive ([place]) 12/08/2019  ? Seasonal allergies 07/30/2017  ? Need for diphtheria-tetanus-pertussis (Tdap) vaccine 07/30/2017  ? Vitamin D deficiency 06/28/2016  ? Osteoporosis 06/18/2015  ? Asthma with bronchitis 06/26/2014  ? Food allergy 02/28/2012  ? Depression 10/11/2010  ? Preventative health care 10/05/2010  ? Screening for malignant neoplasm of the cervix 10/05/2010  ? COMMON MIGRAINE 08/31/2009  ? ALLERGIC RHINITIS 08/31/2009  ? CERVICAL POLYP 08/31/2009  ? ARTHRITIS 08/31/2009  ? POSTMENOPAUSAL STATUS 08/31/2009  ? ? ?Otelia Limes, PTA ?08/01/2021, 8:08 AM ? ?[facility] ?[place], [place], [address] ?Phone: [phone]   Fax:  [phone] ? ?Name: LYDIA TOREN ?MRN: [id number] ?Date of Birth: December 18, 1955 ? ? ? ?

## 2021-08-08 ENCOUNTER — Ambulatory Visit: Payer: Medicare HMO | Admitting: Family Medicine

## 2021-08-11 ENCOUNTER — Ambulatory Visit (INDEPENDENT_AMBULATORY_CARE_PROVIDER_SITE_OTHER): Payer: Medicare HMO

## 2021-08-11 VITALS — BP 114/68 | HR 82 | Temp 97.9°F | Resp 16 | Ht 65.0 in | Wt 187.6 lb

## 2021-08-11 DIAGNOSIS — Z0000 Encounter for general adult medical examination without abnormal findings: Secondary | ICD-10-CM

## 2021-08-11 NOTE — Patient Instructions (Signed)
Katherine Hanna , ?Thank you for taking time to come for your Medicare Wellness Visit. I appreciate your ongoing commitment to your health goals. Please review the following plan we discussed and let me know if I can assist you in the future.  ? ?Screening recommendations/referrals: ?Colonoscopy: declined referral ?Mammogram: 12/13/20 due 12/13/21 ?Bone Density: 12/13/20 due 12/14/22 ?Recommended yearly ophthalmology/optometry visit for glaucoma screening and checkup ?Recommended yearly dental visit for hygiene and checkup ? ?Vaccinations: ?Influenza vaccine: up to date ?Pneumococcal vaccine: up to date ?Tdap vaccine: up to date ?Shingles vaccine: up to date   ?Covid-19:completed ? ?Advanced directives: yes, not on file ? ?Conditions/risks identified: see problem list ? ?Next appointment: Follow up in one year for your annual wellness visit  ? ? ?Preventive Care 47 Years and Older, Female ?Preventive care refers to lifestyle choices and visits with your health care provider that can promote health and wellness. ?What does preventive care include? ?A yearly physical exam. This is also called an annual well check. ?Dental exams once or twice a year. ?Routine eye exams. Ask your health care provider how often you should have your eyes checked. ?Personal lifestyle choices, including: ?Daily care of your teeth and gums. ?Regular physical activity. ?Eating a healthy diet. ?Avoiding tobacco and drug use. ?Limiting alcohol use. ?Practicing safe sex. ?Taking low-dose aspirin every day. ?Taking vitamin and mineral supplements as recommended by your health care provider. ?What happens during an annual well check? ?The services and screenings done by your health care provider during your annual well check will depend on your age, overall health, lifestyle risk factors, and family history of disease. ?Counseling  ?Your health care provider may ask you questions about your: ?Alcohol use. ?Tobacco use. ?Drug use. ?Emotional  well-being. ?Home and relationship well-being. ?Sexual activity. ?Eating habits. ?History of falls. ?Memory and ability to understand (cognition). ?Work and work Statistician. ?Reproductive health. ?Screening  ?You may have the following tests or measurements: ?Height, weight, and BMI. ?Blood pressure. ?Lipid and cholesterol levels. These may be checked every 5 years, or more frequently if you are over 49 years old. ?Skin check. ?Lung cancer screening. You may have this screening every year starting at age 46 if you have a 30-pack-year history of smoking and currently smoke or have quit within the past 15 years. ?Fecal occult blood test (FOBT) of the stool. You may have this test every year starting at age 60. ?Flexible sigmoidoscopy or colonoscopy. You may have a sigmoidoscopy every 5 years or a colonoscopy every 10 years starting at age 32. ?Hepatitis C blood test. ?Hepatitis B blood test. ?Sexually transmitted disease (STD) testing. ?Diabetes screening. This is done by checking your blood sugar (glucose) after you have not eaten for a while (fasting). You may have this done every 1-3 years. ?Bone density scan. This is done to screen for osteoporosis. You may have this done starting at age 78. ?Mammogram. This may be done every 1-2 years. Talk to your health care provider about how often you should have regular mammograms. ?Talk with your health care provider about your test results, treatment options, and if necessary, the need for more tests. ?Vaccines  ?Your health care provider may recommend certain vaccines, such as: ?Influenza vaccine. This is recommended every year. ?Tetanus, diphtheria, and acellular pertussis (Tdap, Td) vaccine. You may need a Td booster every 10 years. ?Zoster vaccine. You may need this after age 100. ?Pneumococcal 13-valent conjugate (PCV13) vaccine. One dose is recommended after age 71. ?Pneumococcal polysaccharide (PPSV23) vaccine. One  dose is recommended after age 7. ?Talk to your  health care provider about which screenings and vaccines you need and how often you need them. ?This information is not intended to replace advice given to you by your health care provider. Make sure you discuss any questions you have with your health care provider. ?Document Released: 06/04/2015 Document Revised: 01/26/2016 Document Reviewed: 03/09/2015 ?Elsevier Interactive Patient Education ? 2017 [place]. ? ?Fall Prevention in the Home ?Falls can cause injuries. They can happen to people of all ages. There are many things you can do to make your home safe and to help prevent falls. ?What can I do on the outside of my home? ?Regularly fix the edges of walkways and driveways and fix any cracks. ?Remove anything that might make you trip as you walk through a door, such as a raised step or threshold. ?Trim any bushes or trees on the path to your home. ?Use bright outdoor lighting. ?Clear any walking paths of anything that might make someone trip, such as rocks or tools. ?Regularly check to see if handrails are loose or broken. Make sure that both sides of any steps have handrails. ?Any raised decks and porches should have guardrails on the edges. ?Have any leaves, snow, or ice cleared regularly. ?Use sand or salt on walking paths during winter. ?Clean up any spills in your garage right away. This includes oil or grease spills. ?What can I do in the bathroom? ?Use night lights. ?Install grab bars by the toilet and in the tub and shower. Do not use towel bars as grab bars. ?Use non-skid mats or decals in the tub or shower. ?If you need to sit down in the shower, use a plastic, non-slip stool. ?Keep the floor dry. Clean up any water that spills on the floor as soon as it happens. ?Remove soap buildup in the tub or shower regularly. ?Attach bath mats securely with double-sided non-slip rug tape. ?Do not have throw rugs and other things on the floor that can make you trip. ?What can I do in the bedroom? ?Use night  lights. ?Make sure that you have a light by your bed that is easy to reach. ?Do not use any sheets or blankets that are too big for your bed. They should not hang down onto the floor. ?Have a firm chair that has side arms. You can use this for support while you get dressed. ?Do not have throw rugs and other things on the floor that can make you trip. ?What can I do in the kitchen? ?Clean up any spills right away. ?Avoid walking on wet floors. ?Keep items that you use a lot in easy-to-reach places. ?If you need to reach something above you, use a strong step stool that has a grab bar. ?Keep electrical cords out of the way. ?Do not use floor polish or wax that makes floors slippery. If you must use wax, use non-skid floor wax. ?Do not have throw rugs and other things on the floor that can make you trip. ?What can I do with my stairs? ?Do not leave any items on the stairs. ?Make sure that there are handrails on both sides of the stairs and use them. Fix handrails that are broken or loose. Make sure that handrails are as long as the stairways. ?Check any carpeting to make sure that it is firmly attached to the stairs. Fix any carpet that is loose or worn. ?Avoid having throw rugs at the top or bottom of the  stairs. If you do have throw rugs, attach them to the floor with carpet tape. ?Make sure that you have a light switch at the top of the stairs and the bottom of the stairs. If you do not have them, ask someone to add them for you. ?What else can I do to help prevent falls? ?Wear shoes that: ?Do not have high heels. ?Have rubber bottoms. ?Are comfortable and fit you well. ?Are closed at the toe. Do not wear sandals. ?If you use a stepladder: ?Make sure that it is fully opened. Do not climb a closed stepladder. ?Make sure that both sides of the stepladder are locked into place. ?Ask someone to hold it for you, if possible. ?Clearly mark and make sure that you can see: ?Any grab bars or handrails. ?First and last  steps. ?Where the edge of each step is. ?Use tools that help you move around (mobility aids) if they are needed. These include: ?Canes. ?Walkers. ?Scooters. ?Crutches. ?Turn on the lights when you go into a dark area. Replace

## 2021-08-11 NOTE — Progress Notes (Signed)
? ?Subjective:  ? Katherine Hanna is a 66 y.o. female who presents for Medicare Annual (Subsequent) preventive examination. ? ?Review of Systems    ? ?Cardiac Risk Factors include: advanced age (>70mn, >>22women) ? ?   ?Objective:  ?  ?Today's Vitals  ? 08/11/21 0928 08/11/21 0929  ?BP: 114/68   ?Pulse: 82   ?Resp: 16   ?Temp: 97.9 ?F (36.6 ?C)   ?SpO2: 99%   ?Weight: 187 lb 9.6 oz (85.1 kg)   ?Height: '5\' 5"'$  (1.651 m)   ?PainSc:  3   ? ?Body mass index is 31.22 kg/m?. ? ? ?  08/11/2021  ?  9:37 AM 02/07/2021  ?  8:36 AM 08/05/2020  ?  9:27 AM 06/10/2020  ?  8:21 AM 02/19/2020  ?  8:12 AM 01/20/2020  ?  8:23 AM 01/13/2020  ? 10:27 AM  ?Advanced Directives  ?Does Patient Have a Medical Advance Directive? Yes No Yes No Yes Yes Yes  ?Type of AParamedicof AHockessinLiving will;Out of facility DNR (pink MOST or yellow form)  HAtlasLiving will  HMarkleLiving will HAshlandLiving will HOnleyLiving will  ?Does patient want to make changes to medical advance directive? No - Patient declined  No - Patient declined   No - Patient declined No - Patient declined  ?Copy of H[place]ancellorin Chart? No - copy requested  No - copy requested  No - copy requested No - copy requested No - copy requested  ?Would patient like information on creating a medical advance directive?    No - Patient declined     ? ? ?Current Medications (verified) ?Outpatient Encounter Medications as of 08/11/2021  ?Medication Sig  ? albuterol (VENTOLIN HFA) 108 (90 Base) MCG/ACT inhaler Inhale 2 puffs into the lungs every 6 (six) hours as needed for wheezing or shortness of breath.  ? anastrozole (ARIMIDEX) 1 MG tablet Take 1 tablet (1 mg total) by mouth daily.  ? Calcium Carbonate-Vitamin D (CALCIUM 500 + D PO) Take 1 tablet by mouth 2 (two) times daily.  ? cetirizine (ZYRTEC) 10 MG tablet Take 10 mg by mouth at bedtime.  ? co-enzyme Q-10 30  MG capsule Take 30 mg by mouth daily.  ? fluticasone-salmeterol (ADVAIR HFA) 115-21 MCG/ACT inhaler Inhale 2 puffs into the lungs 2 (two) times daily.  ? guaiFENesin-codeine 100-10 MG/5ML syrup Take 5 mLs by mouth every 6 (six) hours as needed for cough.  ? Misc Natural Products (TART CHERRY ADVANCED PO) Take by mouth.  ? Multiple Vitamin (MULTIVITAMIN) tablet Take 1 tablet by mouth daily.  ? TURMERIC PO Take by mouth.  ? ?No facility-administered encounter medications on file as of 08/11/2021.  ? ? ?Allergies (verified) ?Penicillins  ? ?History: ?Past Medical History:  ?Diagnosis Date  ? Allergic rhinitis   ? Arthritis   ? knee  ? Asthma   ? Breast cancer ([place]   ? History of chicken pox   ? Migraine   ? Rheumatic fever   ? as a child.  ? ?Past Surgical History:  ?Procedure Laterality Date  ? APPENDECTOMY  11/1971  ? BREAST LUMPECTOMY WITH RADIOACTIVE SEED AND SENTINEL LYMPH NODE BIOPSY Right 01/20/2020  ? Procedure: RIGHT BREAST LUMPECTOMY WITH RADIOACTIVE SEED AND SENTINEL LYMPH NODE BIOPSY;  Surgeon: BStark Klein MD;  Location: [facility]  Service: General;  Laterality: Right;  ? COLONOSCOPY    ? TONSILLECTOMY    ?  when she was 2 or 3  ? ?Family History  ?Problem Relation Age of Onset  ? Lung cancer Mother   ?     smoked  ? Diabetes Father   ? Stroke Father   ? Cancer Father   ?     bladder  ? Leukemia Father   ? Stroke Maternal Grandmother   ?     50s  ? Coronary artery disease Maternal Grandfather   ? Stroke Paternal Grandmother   ? Colon cancer Maternal Uncle   ? Breast cancer Paternal Aunt   ? Esophageal cancer Neg Hx   ? ?Social History  ? ?Socioeconomic History  ? Marital status: Married  ?  Spouse name: Not on file  ? Number of children: 2  ? Years of education: Not on file  ? Highest education level: Not on file  ?Occupational History  ? Occupation: Engineer, maintenance (IT)  ?Tobacco Use  ? Smoking status: Never  ? Smokeless tobacco: Never  ?Vaping Use  ? Vaping Use: Never used  ?Substance and Sexual Activity   ? Alcohol use: No  ? Drug use: No  ? Sexual activity: Yes  ?  Partners: Male  ?Other Topics Concern  ? Not on file  ?Social History Narrative  ? Not on file  ? ?Social Determinants of Health  ? ?Financial Resource Strain: Not on file  ?Food Insecurity: Not on file  ?Transportation Needs: Not on file  ?Physical Activity: Not on file  ?Stress: Not on file  ?Social Connections: Not on file  ? ? ?Tobacco Counseling ?Counseling given: Not Answered ? ? ?Clinical Intake: ? ?Pre-visit preparation completed: Yes ? ?Pain : 0-10 ?Pain Score: 3  ?Pain Type: Chronic pain ?Pain Location: Ankle ?Pain Orientation: Left ?Pain Descriptors / Indicators: Aching, Sore ?Pain Onset: More than a month ago ?Pain Frequency: Constant ? ?  ? ?BMI - recorded: 31.22 ?Nutritional Status: BMI > 30  Obese ?Nutritional Risks: None ?Diabetes: No ? ?How often do you need to have someone help you when you read instructions, pamphlets, or other written materials from your doctor or pharmacy?: 1 - Never ? ?Diabetic?No ? ?Interpreter Needed?: No ? ?Information entered by :: Areal Cochrane ? ? ?Activities of Daily Living ? ?  08/11/2021  ?  9:35 AM  ?In your present state of health, do you have any difficulty performing the following activities:  ?Hearing? 0  ?Vision? 0  ?Difficulty concentrating or making decisions? 0  ?Walking or climbing stairs? 0  ?Dressing or bathing? 0  ?Doing errands, shopping? 0  ?Preparing Food and eating ? N  ?Using the Toilet? N  ?In the past six months, have you accidently leaked urine? N  ?Do you have problems with loss of bowel control? N  ?Managing your Medications? N  ?Managing your Finances? N  ?Housekeeping or managing your Housekeeping? N  ? ? ?Patient Care Team: ?Carollee Herter, Alferd Apa, DO as PCP - General (Family Medicine) ?Sable Feil, MD as Consulting Physician (Gastroenterology) ?Hollar, Katharine Look, MD as Referring Physician (Dermatology) ?Lynnell Dike, OD as Consulting Physician (Optometry) ?Mauro Kaufmann, RN as Oncology Nurse Navigator ?Rockwell Germany, RN as Oncology Nurse Navigator ?Stark Klein, MD as Consulting Physician (General Surgery) ?Truitt Merle, MD as Consulting Physician (Hematology) ?Gery Pray, MD as Consulting Physician (Radiation Oncology) ?Alla Feeling, NP as Nurse Practitioner (Nurse Practitioner) ? ?Indicate any recent Medical Services you may have received from other than [facility] providers in the past year (date may be  approximate). ? ?   ?Assessment:  ? This is a routine wellness examination for Shalee. ? ?Hearing/Vision screen ?No results found. ? ?Dietary issues and exercise activities discussed: ?Current Exercise Habits: Home exercise routine, Type of exercise: walking, Time (Minutes): 40, Frequency (Times/Week): 7, Weekly Exercise (Minutes/Week): 280, Intensity: Mild, Exercise limited by: None identified ? ? Goals Addressed   ?None ?  ? ?Depression Screen ? ?  08/11/2021  ?  9:38 AM 08/05/2020  ?  9:12 AM 08/04/2019  ?  9:07 AM 08/02/2018  ?  8:07 AM 04/24/2013  ? 10:31 AM  ?PHQ 2/9 Scores  ?PHQ - 2 Score 0 0 4 0 0  ?PHQ- 9 Score   5    ?  ?Fall Risk ? ?  08/11/2021  ?  9:37 AM 08/05/2020  ?  9:25 AM 08/05/2020  ?  9:12 AM 08/04/2019  ?  8:51 AM 04/24/2013  ? 10:31 AM  ?Fall Risk   ?Falls in the past year? 0 0 0 1 No  ?Number falls in past yr: 0  0 0   ?Injury with Fall? 0  0 1   ?Risk for fall due to : No Fall Risks No Fall Risks     ?Follow up Falls evaluation completed  Falls evaluation completed Falls evaluation completed   ? ? ?FALL RISK PREVENTION PERTAINING TO THE HOME: ? ?Any stairs in or around the home? Yes  ?If so, are there any without handrails? No  ?Home free of loose throw rugs in walkways, pet beds, electrical cords, etc? Yes  ?Adequate lighting in your home to reduce risk of falls? Yes  ? ?ASSISTIVE DEVICES UTILIZED TO PREVENT FALLS: ? ?Life alert? No  ?Use of a cane, walker or w/c? No  ?Grab bars in the bathroom? No  ?Shower chair or bench in shower? No  ?Elevated toilet  seat or a handicapped toilet? Yes  ? ?TIMED UP AND GO: ? ?Was the test performed? Yes .  ?Length of time to ambulate 10 feet: 9 sec.  ?Gait steady and fast without use of assistive device ? ? ?Cognitive [employer]

## 2021-08-16 DIAGNOSIS — D1801 Hemangioma of skin and subcutaneous tissue: Secondary | ICD-10-CM | POA: Diagnosis not present

## 2021-08-16 DIAGNOSIS — D225 Melanocytic nevi of trunk: Secondary | ICD-10-CM | POA: Diagnosis not present

## 2021-08-16 DIAGNOSIS — L814 Other melanin hyperpigmentation: Secondary | ICD-10-CM | POA: Diagnosis not present

## 2021-08-16 DIAGNOSIS — D2372 Other benign neoplasm of skin of left lower limb, including hip: Secondary | ICD-10-CM | POA: Diagnosis not present

## 2021-08-16 DIAGNOSIS — L821 Other seborrheic keratosis: Secondary | ICD-10-CM | POA: Diagnosis not present

## 2021-08-16 DIAGNOSIS — X32XXXS Exposure to sunlight, sequela: Secondary | ICD-10-CM | POA: Diagnosis not present

## 2021-08-17 DIAGNOSIS — R030 Elevated blood-pressure reading, without diagnosis of hypertension: Secondary | ICD-10-CM | POA: Diagnosis not present

## 2021-08-17 DIAGNOSIS — Z823 Family history of stroke: Secondary | ICD-10-CM | POA: Diagnosis not present

## 2021-08-17 DIAGNOSIS — Z7983 Long term (current) use of bisphosphonates: Secondary | ICD-10-CM | POA: Diagnosis not present

## 2021-08-17 DIAGNOSIS — Z809 Family history of malignant neoplasm, unspecified: Secondary | ICD-10-CM | POA: Diagnosis not present

## 2021-08-17 DIAGNOSIS — Z79811 Long term (current) use of aromatase inhibitors: Secondary | ICD-10-CM | POA: Diagnosis not present

## 2021-08-17 DIAGNOSIS — J309 Allergic rhinitis, unspecified: Secondary | ICD-10-CM | POA: Diagnosis not present

## 2021-08-17 DIAGNOSIS — M1990 Unspecified osteoarthritis, unspecified site: Secondary | ICD-10-CM | POA: Diagnosis not present

## 2021-08-17 DIAGNOSIS — K219 Gastro-esophageal reflux disease without esophagitis: Secondary | ICD-10-CM | POA: Diagnosis not present

## 2021-08-17 DIAGNOSIS — J40 Bronchitis, not specified as acute or chronic: Secondary | ICD-10-CM | POA: Diagnosis not present

## 2021-08-17 DIAGNOSIS — M810 Age-related osteoporosis without current pathological fracture: Secondary | ICD-10-CM | POA: Diagnosis not present

## 2021-08-17 DIAGNOSIS — Z008 Encounter for other general examination: Secondary | ICD-10-CM | POA: Diagnosis not present

## 2021-08-17 DIAGNOSIS — Z7951 Long term (current) use of inhaled steroids: Secondary | ICD-10-CM | POA: Diagnosis not present

## 2021-08-17 DIAGNOSIS — E669 Obesity, unspecified: Secondary | ICD-10-CM | POA: Diagnosis not present

## 2021-08-22 ENCOUNTER — Telehealth: Payer: Self-pay | Admitting: Hematology

## 2021-08-22 NOTE — Telephone Encounter (Signed)
Rescheduled appointment per provider.  Patient is aware and confirmed appointment ?

## 2021-09-06 ENCOUNTER — Encounter: Payer: Self-pay | Admitting: Family Medicine

## 2021-09-06 ENCOUNTER — Ambulatory Visit (INDEPENDENT_AMBULATORY_CARE_PROVIDER_SITE_OTHER): Payer: Medicare HMO | Admitting: Family Medicine

## 2021-09-06 VITALS — BP 124/72 | HR 77 | Temp 98.6°F | Resp 18 | Ht 65.0 in | Wt 186.4 lb

## 2021-09-06 DIAGNOSIS — Z23 Encounter for immunization: Secondary | ICD-10-CM

## 2021-09-06 DIAGNOSIS — Z1211 Encounter for screening for malignant neoplasm of colon: Secondary | ICD-10-CM | POA: Diagnosis not present

## 2021-09-06 DIAGNOSIS — Z136 Encounter for screening for cardiovascular disorders: Secondary | ICD-10-CM

## 2021-09-06 DIAGNOSIS — Z0000 Encounter for general adult medical examination without abnormal findings: Secondary | ICD-10-CM

## 2021-09-06 LAB — COMPREHENSIVE METABOLIC PANEL
ALT: 21 U/L (ref 0–35)
AST: 19 U/L (ref 0–37)
Albumin: 4.4 g/dL (ref 3.5–5.2)
Alkaline Phosphatase: 95 U/L (ref 39–117)
BUN: 15 mg/dL (ref 6–23)
CO2: 29 mEq/L (ref 19–32)
Calcium: 9.7 mg/dL (ref 8.4–10.5)
Chloride: 103 mEq/L (ref 96–112)
Creatinine, Ser: 0.8 mg/dL (ref 0.40–1.20)
GFR: 76.17 mL/min (ref >60.00)
Glucose, Bld: 93 mg/dL (ref 70–99)
Potassium: 4.3 mEq/L (ref 3.5–5.1)
Sodium: 140 mEq/L (ref 135–145)
Total Bilirubin: 0.5 mg/dL (ref 0.2–1.2)
Total Protein: 7 g/dL (ref 6.0–8.3)

## 2021-09-06 LAB — CBC WITH DIFFERENTIAL/PLATELET
Basophils Absolute: 0 10*3/uL (ref 0.0–0.1)
Basophils Relative: 0.3 % (ref 0.0–3.0)
Eosinophils Absolute: 0.1 10*3/uL (ref 0.0–0.7)
Eosinophils Relative: 1.2 % (ref 0.0–5.0)
HCT: 39.9 % (ref 36.0–46.0)
Hemoglobin: 13.3 g/dL (ref 12.0–15.0)
Lymphocytes Relative: 18 % (ref 12.0–46.0)
Lymphs Abs: 1.2 10*3/uL (ref 0.7–4.0)
MCHC: 33.4 g/dL (ref 30.0–36.0)
MCV: 94.2 fl (ref 78.0–100.0)
Monocytes Absolute: 0.4 10*3/uL (ref 0.1–1.0)
Monocytes Relative: 6.5 % (ref 3.0–12.0)
Neutro Abs: 4.9 10*3/uL (ref 1.4–7.7)
Neutrophils Relative %: 74 % (ref 43.0–77.0)
Platelets: 279 10*3/uL (ref 150.0–400.0)
RBC: 4.23 Mil/uL (ref 3.87–5.11)
RDW: 13.7 % (ref 11.5–15.5)
WBC: 6.7 10*3/uL (ref 4.0–10.5)

## 2021-09-06 LAB — LIPID PANEL
Cholesterol: 209 mg/dL — ABNORMAL HIGH (ref 0–200)
HDL: 57.4 mg/dL (ref >39.00)
LDL Cholesterol: 133 mg/dL — ABNORMAL HIGH (ref 0–99)
NonHDL: 152.08
Total CHOL/HDL Ratio: 4
Triglycerides: 96 mg/dL (ref 0.0–149.0)
VLDL: 19.2 mg/dL (ref 0.0–40.0)

## 2021-09-06 NOTE — Patient Instructions (Signed)
Preventive Care 7 Years and Older, Female ?Preventive care refers to lifestyle choices and visits with your health care provider that can promote health and wellness. Preventive care visits are also called wellness exams. ?What can I expect for my preventive care visit? ?Counseling ?Your health care provider may ask you questions about your: ?Medical history, including: ?Past medical problems. ?Family medical history. ?Pregnancy and menstrual history. ?History of falls. ?Current health, including: ?Memory and ability to understand (cognition). ?Emotional well-being. ?Home life and relationship well-being. ?Sexual activity and sexual health. ?Lifestyle, including: ?Alcohol, nicotine or tobacco, and drug use. ?Access to firearms. ?Diet, exercise, and sleep habits. ?Work and work Statistician. ?Sunscreen use. ?Safety issues such as seatbelt and bike helmet use. ?Physical exam ?Your health care provider will check your: ?Height and weight. These may be used to calculate your BMI (body mass index). BMI is a measurement that tells if you are at a healthy weight. ?Waist circumference. This measures the distance around your waistline. This measurement also tells if you are at a healthy weight and may help predict your risk of certain diseases, such as type 2 diabetes and high blood pressure. ?Heart rate and blood pressure. ?Body temperature. ?Skin for abnormal spots. ?What immunizations do I need? ? ?Vaccines are usually given at various ages, according to a schedule. Your health care provider will recommend vaccines for you based on your age, medical history, and lifestyle or other factors, such as travel or where you work. ?What tests do I need? ?Screening ?Your health care provider may recommend screening tests for certain conditions. This may include: ?Lipid and cholesterol levels. ?Hepatitis C test. ?Hepatitis B test. ?HIV (human immunodeficiency virus) test. ?STI (sexually transmitted infection) testing, if you are at  risk. ?Lung cancer screening. ?Colorectal cancer screening. ?Diabetes screening. This is done by checking your blood sugar (glucose) after you have not eaten for a while (fasting). ?Mammogram. Talk with your health care provider about how often you should have regular mammograms. ?BRCA-related cancer screening. This may be done if you have a family history of breast, ovarian, tubal, or peritoneal cancers. ?Bone density scan. This is done to screen for osteoporosis. ?Talk with your health care provider about your test results, treatment options, and if necessary, the need for more tests. ?Follow these instructions at home: ?Eating and drinking ? ?Eat a diet that includes fresh fruits and vegetables, whole grains, lean protein, and low-fat dairy products. Limit your intake of foods with high amounts of sugar, saturated fats, and salt. ?Take vitamin and mineral supplements as recommended by your health care provider. ?Do not drink alcohol if your health care provider tells you not to drink. ?If you drink alcohol: ?Limit how much you have to 0-1 drink a day. ?Know how much alcohol is in your drink. In the [place] one drink equals one 12 oz bottle of beer (355 mL), one 5 oz glass of wine (148 mL), or one 1? oz glass of hard liquor (44 mL). ?Lifestyle ?Brush your teeth every morning and night with fluoride toothpaste. Floss one time each day. ?Exercise for at least 30 minutes 5 or more days each week. ?Do not use any products that contain nicotine or tobacco. These products include cigarettes, chewing tobacco, and vaping devices, such as e-cigarettes. If you need help quitting, ask your health care provider. ?Do not use drugs. ?If you are sexually active, practice safe sex. Use a condom or other form of protection in order to prevent STIs. ?Take aspirin only as told by  your health care provider. Make sure that you understand how much to take and what form to take. Work with your health care provider to find out whether it  is safe and beneficial for you to take aspirin daily. ?Ask your health care provider if you need to take a cholesterol-lowering medicine (statin). ?Find healthy ways to manage stress, such as: ?Meditation, yoga, or listening to music. ?Journaling. ?Talking to a trusted person. ?Spending time with friends and family. ?Minimize exposure to UV radiation to reduce your risk of skin cancer. ?Safety ?Always wear your seat belt while driving or riding in a vehicle. ?Do not drive: ?If you have been drinking alcohol. Do not ride with someone who has been drinking. ?When you are tired or distracted. ?While texting. ?If you have been using any mind-altering substances or drugs. ?Wear a helmet and other protective equipment during sports activities. ?If you have firearms in your house, make sure you follow all gun safety procedures. ?What's next? ?Visit your health care provider once a year for an annual wellness visit. ?Ask your health care provider how often you should have your eyes and teeth checked. ?Stay up to date on all vaccines. ?This information is not intended to replace advice given to you by your health care provider. Make sure you discuss any questions you have with your health care provider. ?Document Revised: 11/03/2020 Document Reviewed: 11/03/2020 ?Elsevier Patient Education ? [place]. ? ?

## 2021-09-06 NOTE — Progress Notes (Signed)
? ? ?Subjective:  ?  ? Katherine Hanna is a 66 y.o. female and is here for a comprehensive physical exam. The patient reports no problems. ? ?Social History  ? ?Socioeconomic History  ? Marital status: Married  ?  Spouse name: Not on file  ? Number of children: 2  ? Years of education: Not on file  ? Highest education level: Not on file  ?Occupational History  ? Occupation: Engineer, maintenance (IT)  ?Tobacco Use  ? Smoking status: Never  ? Smokeless tobacco: Never  ?Vaping Use  ? Vaping Use: Never used  ?Substance and Sexual Activity  ? Alcohol use: No  ? Drug use: No  ? Sexual activity: Yes  ?  Partners: Male  ?Other Topics Concern  ? Not on file  ?Social History Narrative  ? Exercise---   no   ? ?Social Determinants of Health  ? ?Financial Resource Strain: Low Risk   ? Difficulty of Paying Living Expenses: Not hard at all  ?Food Insecurity: No Food Insecurity  ? Worried About Charity fundraiser in the Last Year: Never true  ? Ran Out of Food in the Last Year: Never true  ?Transportation Needs: No Transportation Needs  ? Lack of Transportation (Medical): No  ? Lack of Transportation (Non-Medical): No  ?Physical Activity: Not on file  ?Stress: No Stress Concern Present  ? Feeling of Stress : Not at all  ?Social Connections: Moderately Integrated  ? Frequency of Communication with Friends and Family: More than three times a week  ? Frequency of Social Gatherings with Friends and Family: Once a week  ? Attends Religious Services: More than 4 times per year  ? Active Member of Clubs or Organizations: No  ? Attends Archivist Meetings: Never  ? Marital Status: Married  ?Intimate Partner Violence: Not At Risk  ? Fear of Current or Ex-Partner: No  ? Emotionally Abused: No  ? Physically Abused: No  ? Sexually Abused: No  ? ?Health Maintenance  ?Topic Date Due  ? COLONOSCOPY (Pts 45-71yr Insurance coverage will need to be confirmed)  10/12/2019  ? MAMMOGRAM  12/13/2021  ? INFLUENZA VACCINE  12/20/2021  ? DEXA SCAN  12/14/2022  ?  TETANUS/TDAP  07/31/2027  ? Pneumonia Vaccine 68 Years old  Completed  ? COVID-19 Vaccine  Completed  ? Hepatitis C Screening  Completed  ? Zoster Vaccines- Shingrix  Completed  ? HPV VACCINES  Aged Out  ? ? ?The following portions of the patient's history were reviewed and updated as appropriate: She  has a past medical history of Allergic rhinitis, Arthritis, Asthma, Breast cancer ([facility], History of chicken pox, Migraine, and Rheumatic fever. ?She does not have any pertinent problems on file. ?She  has a past surgical history that includes Appendectomy (11/1971); Tonsillectomy; Colonoscopy; and Breast lumpectomy with radioactive seed and sentinel lymph node biopsy (Right, 01/20/2020). ?Her family history includes Breast cancer in her paternal aunt; Cancer in her father; Colon cancer in her maternal uncle; Coronary artery disease in her maternal grandfather; Diabetes in her father; Leukemia in her father; Lung cancer in her mother; Stroke in her father, maternal grandmother, and paternal grandmother. ?She  reports that she has never smoked. She has never used smokeless tobacco. She reports that she does not drink alcohol and does not use drugs. ?She has a current medication list which includes the following prescription(s): albuterol, anastrozole, calcium carb-cholecalciferol, cetirizine, co-enzyme q-10, advair hfa, guaifenesin-codeine, misc natural products, multivitamin, and turmeric. ?Current Outpatient Medications on  File Prior to Visit  ?Medication Sig Dispense Refill  ? albuterol (VENTOLIN HFA) 108 (90 Base) MCG/ACT inhaler Inhale 2 puffs into the lungs every 6 (six) hours as needed for wheezing or shortness of breath. 8 g 0  ? anastrozole (ARIMIDEX) 1 MG tablet Take 1 tablet (1 mg total) by mouth daily. 90 tablet 3  ? Calcium Carbonate-Vitamin D (CALCIUM 500 + D PO) Take 1 tablet by mouth 2 (two) times daily.    ? cetirizine (ZYRTEC) 10 MG tablet Take 10 mg by mouth at bedtime.    ? co-enzyme Q-10 30 MG  capsule Take 30 mg by mouth daily.    ? fluticasone-salmeterol (ADVAIR HFA) 115-21 MCG/ACT inhaler Inhale 2 puffs into the lungs 2 (two) times daily. 1 each 12  ? guaiFENesin-codeine 100-10 MG/5ML syrup Take 5 mLs by mouth every 6 (six) hours as needed for cough. 120 mL 0  ? Misc Natural Products (TART CHERRY ADVANCED PO) Take by mouth.    ? Multiple Vitamin (MULTIVITAMIN) tablet Take 1 tablet by mouth daily.    ? TURMERIC PO Take by mouth.    ? ?No current facility-administered medications on file prior to visit.  ? ?She is allergic to penicillins.. ? ?Review of Systems ?Review of Systems  ?Constitutional: Negative for activity change, appetite change and fatigue.  ?HENT: Negative for hearing loss, congestion, tinnitus and ear discharge.  dentist q41m?Eyes: Negative for visual disturbance (see optho q1y -- vision corrected to 20/20 with glasses).  ?Respiratory: Negative for cough, chest tightness and shortness of breath.   ?Cardiovascular: Negative for chest pain, palpitations and leg swelling.  ?Gastrointestinal: Negative for abdominal pain, diarrhea, constipation and abdominal distention.  ?Genitourinary: Negative for urgency, frequency, decreased urine volume and difficulty urinating.  ?Musculoskeletal: Negative for back pain, arthralgias and gait problem.  ?Skin: Negative for color change, pallor and rash.  ?Neurological: Negative for dizziness, light-headedness, numbness and headaches.  ?Hematological: Negative for adenopathy. Does not bruise/bleed easily.  ?Psychiatric/Behavioral: Negative for suicidal ideas, confusion, sleep disturbance, self-injury, dysphoric mood, decreased concentration and agitation.  ? ? ? ? ?Objective:  ? ? BP 124/72 (BP Location: Left Arm, Patient Position: Sitting, Cuff Size: Normal)   Pulse 77   Temp 98.6 ?F (37 ?C) (Oral)   Resp 18   Ht '5\' 5"'$  (1.651 m)   Wt 186 lb 6.4 oz (84.6 kg)   SpO2 97%   BMI 31.02 kg/m?  ?General appearance: alert, cooperative, appears stated age, and  no distress ?Head: Normocephalic, without obvious abnormality, atraumatic ?Eyes: negative findings: lids and lashes normal, conjunctivae and sclerae normal, and pupils equal, round, reactive to light and accomodation ?Ears: normal TM's and external ear canals both ears ?Nose: Nares normal. Septum midline. Mucosa normal. No drainage or sinus tenderness. ?Throat: lips, mucosa, and tongue normal; teeth and gums normal ?Neck: no adenopathy, no carotid bruit, no JVD, supple, symmetrical, trachea midline, and thyroid not enlarged, symmetric, no tenderness/mass/nodules ?Back: symmetric, no curvature. ROM normal. No CVA tenderness. ?Lungs: clear to auscultation bilaterally ?Heart: regular rate and rhythm, S1, S2 normal, no murmur, click, rub or gallop ?Abdomen: soft, non-tender; bowel sounds normal; no masses,  no organomegaly ?Extremities: extremities normal, atraumatic, no cyanosis or edema ?Pulses: 2+ and symmetric ?Skin: Skin color, texture, turgor normal. No rashes or lesions ?Lymph nodes: Cervical, supraclavicular, and axillary nodes normal. ?Neurologic: Alert and oriented X 3, normal strength and tone. Normal symmetric reflexes. Normal coordination and gait  ?  ?Assessment:  ? ? Healthy female exam.    ?  ?  Plan:  ? ? Ghm utd-- colon ordered  ?Pneum 20 given  ?Check labs  ?See After Visit Summary for Counseling Recommendations  ? ?1. Colon cancer screening ? ? ?- Ambulatory referral to Gastroenterology ?- Lipid panel ?- Comprehensive metabolic panel ? ?2. Need for pneumococcal 20-valent conjugate vaccination ? ? ?- Pneumococcal conjugate vaccine 20-valent (Prevnar 20) ? ?3. Ischemic heart disease screen ? ? ?- CBC with Differential/Platelet ?- Lipid panel ?- Comprehensive metabolic panel ? ?4. Preventative health care ? ?See above  ? ? ?

## 2021-09-07 ENCOUNTER — Ambulatory Visit: Payer: Medicare HMO | Admitting: Hematology

## 2021-09-07 ENCOUNTER — Other Ambulatory Visit: Payer: Medicare HMO

## 2021-09-08 ENCOUNTER — Ambulatory Visit: Payer: Medicare HMO | Admitting: Pulmonary Disease

## 2021-09-08 ENCOUNTER — Encounter: Payer: Self-pay | Admitting: Pulmonary Disease

## 2021-09-08 VITALS — BP 124/78 | HR 66 | Ht 65.0 in | Wt 186.8 lb

## 2021-09-08 DIAGNOSIS — J4520 Mild intermittent asthma, uncomplicated: Secondary | ICD-10-CM | POA: Diagnosis not present

## 2021-09-08 NOTE — Patient Instructions (Addendum)
Continue advair 2 puffs twice daily with spacer ?- rinse mouth out after each visit ? ?Use saline nasal spray as needed  ? ?Continue zyrtec daily for allergies ? ?Follow up in 4 months with pulmonary function tests ?

## 2021-09-08 NOTE — Progress Notes (Signed)
? ?Synopsis: Referred in January 2023 for bronchitis by Vanna Scotland, DO ? ?Subjective:  ? ?PATIENT ID: Katherine Hanna GENDER: female DOB: 01/28/1956, MRN: [id number] ? ?HPI ? ?Chief Complaint  ?Patient presents with  ? Follow-up  ?  3 mo f/u. States her breathing has stable since last visit. Productive cough with thick clear mucus.   ? ?Katherine Hanna is a 66 year old woman, never smoker with history of breast cancer and allergic rhinitis who returns to pulmonary clinic for reactive airways disease. ? ?She was transitioned from flovent to advair HFA 115-67mg 2 puffs twice daily with spacer at last visit. She tried to taper down to 1 puff twice a day but reports a return of her symptoms. Once resuming 2 puffs twice daily she felt better. She is planning to continue the inhaler through out the spring pollen season and then attempt to taper off once again. ? ?She has noticed intermittent blood tinged nasal secretions. She has occasional post-nasal drainage. ? ?OV 05/31/21 ?She reports developing upper respiratory viral infection 9 to 10 weeks ago. She tested negative for covid at that time. She thought she had the flu. She had cough, wheezing and shortness of breath along with fatigue. She was initially treated with a zpak without much improvement. She was then treated with a course of prednisone and another zpak with some improvement. She completed 2 more rounds of antibiotics and another round of prednisone with some improvement and was then placed on flovent 445m 2 puffs twice daily with spacer and PRN albuterol inhaler. She reports the cough is much better at this time and is not experiencing wheezing now. She does have some sinus congestion with post nasal drainage. Denies significant reflux symptoms. She is taking zyrtec for seasonal allergies.  ? ?She reports having intermittent wheezing with previous episodes of viral infections that did not take this long to resolve.  ? ?She had CT scan 11/29 which showed  anterior peripheral fibrotic changes of the right upper lobe secondary to breast radiation. She has minimal mucous plugging of the right lower lobe distal airways. No opacities or infusions.  ? ?Past Medical History:  ?Diagnosis Date  ? Allergic rhinitis   ? Arthritis   ? knee  ? Asthma   ? Breast cancer ([facility]  ? History of chicken pox   ? Migraine   ? Rheumatic fever   ? as a child.  ?  ? ?Family History  ?Problem Relation Age of Onset  ? Lung cancer Mother   ?     smoked  ? Diabetes Father   ? Stroke Father   ? Cancer Father   ?     bladder  ? Leukemia Father   ? Stroke Maternal Grandmother   ?     4056s? Coronary artery disease Maternal Grandfather   ? Stroke Paternal Grandmother   ? Colon cancer Maternal Uncle   ? Breast cancer Paternal Aunt   ? Esophageal cancer Neg Hx   ?  ? ?Social History  ? ?Socioeconomic History  ? Marital status: Married  ?  Spouse name: Not on file  ? Number of children: 2  ? Years of education: Not on file  ? Highest education level: Not on file  ?Occupational History  ? Occupation: CPEngineer, maintenance (IT)?Tobacco Use  ? Smoking status: Never  ? Smokeless tobacco: Never  ?Vaping Use  ? Vaping Use: Never used  ?Substance and Sexual Activity  ? Alcohol use: No  ?  Drug use: No  ? Sexual activity: Yes  ?  Partners: Male  ?Other Topics Concern  ? Not on file  ?Social History Narrative  ? Exercise---   no   ? ?Social Determinants of Health  ? ?Financial Resource Strain: Low Risk   ? Difficulty of Paying Living Expenses: Not hard at all  ?Food Insecurity: No Food Insecurity  ? Worried About Charity fundraiser in the Last Year: Never true  ? Ran Out of Food in the Last Year: Never true  ?Transportation Needs: No Transportation Needs  ? Lack of Transportation (Medical): No  ? Lack of Transportation (Non-Medical): No  ?Physical Activity: Not on file  ?Stress: No Stress Concern Present  ? Feeling of Stress : Not at all  ?Social Connections: Moderately Integrated  ? Frequency of Communication with Friends and  Family: More than three times a week  ? Frequency of Social Gatherings with Friends and Family: Once a week  ? Attends Religious Services: More than 4 times per year  ? Active Member of Clubs or Organizations: No  ? Attends Archivist Meetings: Never  ? Marital Status: Married  ?Intimate Partner Violence: Not At Risk  ? Fear of Current or Ex-Partner: No  ? Emotionally Abused: No  ? Physically Abused: No  ? Sexually Abused: No  ?  ? ?Allergies  ?Allergen Reactions  ? Penicillins Rash  ?  Rash head to toe  ?  ? ?Outpatient Medications Prior to Visit  ?Medication Sig Dispense Refill  ? albuterol (VENTOLIN HFA) 108 (90 Base) MCG/ACT inhaler Inhale 2 puffs into the lungs every 6 (six) hours as needed for wheezing or shortness of breath. 8 g 0  ? anastrozole (ARIMIDEX) 1 MG tablet Take 1 tablet (1 mg total) by mouth daily. 90 tablet 3  ? Calcium Carbonate-Vitamin D (CALCIUM 500 + D PO) Take 1 tablet by mouth 2 (two) times daily.    ? cetirizine (ZYRTEC) 10 MG tablet Take 10 mg by mouth at bedtime.    ? co-enzyme Q-10 30 MG capsule Take 30 mg by mouth daily.    ? fluticasone-salmeterol (ADVAIR HFA) 115-21 MCG/ACT inhaler Inhale 2 puffs into the lungs 2 (two) times daily. 1 each 12  ? guaiFENesin-codeine 100-10 MG/5ML syrup Take 5 mLs by mouth every 6 (six) hours as needed for cough. 120 mL 0  ? Misc Natural Products (TART CHERRY ADVANCED PO) Take by mouth.    ? Multiple Vitamin (MULTIVITAMIN) tablet Take 1 tablet by mouth daily.    ? TURMERIC PO Take by mouth.    ? ?No facility-administered medications prior to visit.  ? ?Review of Systems  ?Constitutional:  Negative for chills, fever, malaise/fatigue and weight loss.  ?HENT:  Negative for congestion, sinus pain and sore throat.   ?Eyes: Negative.   ?Respiratory:  Negative for cough, hemoptysis, sputum production, shortness of breath and wheezing.   ?Cardiovascular:  Negative for chest pain, palpitations, orthopnea, claudication and leg swelling.   ?Gastrointestinal:  Negative for abdominal pain, heartburn, nausea and vomiting.  ?Genitourinary: Negative.   ?Musculoskeletal:  Negative for joint pain and myalgias.  ?Skin:  Negative for rash.  ?Neurological:  Negative for weakness.  ?Endo/Heme/Allergies:  Positive for environmental allergies.  ?Psychiatric/Behavioral: Negative.    ? ?Objective:  ? ?Vitals:  ? 09/08/21 0903  ?BP: 124/78  ?Pulse: 66  ?SpO2: 99%  ?Weight: 186 lb 12.8 oz (84.7 kg)  ?Height: '5\' 5"'$  (1.651 m)  ? ?Physical Exam ?Constitutional:   ?  General: She is not in acute distress. ?   Appearance: She is not ill-appearing.  ?HENT:  ?   Head: Normocephalic and atraumatic.  ?Eyes:  ?   General: No scleral icterus. ?   Conjunctiva/sclera: Conjunctivae normal.  ?Cardiovascular:  ?   Rate and Rhythm: Normal rate and regular rhythm.  ?   Pulses: Normal pulses.  ?   Heart sounds: Normal heart sounds. No murmur heard. ?Pulmonary:  ?   Effort: Pulmonary effort is normal.  ?   Breath sounds: Normal breath sounds. No wheezing, rhonchi or rales.  ?Musculoskeletal:  ?   Right lower leg: No edema.  ?   Left lower leg: No edema.  ?Skin: ?   General: Skin is warm and dry.  ?Neurological:  ?   General: No focal deficit present.  ?   Mental Status: She is alert.  ?Psychiatric:     ?   Mood and Affect: Mood normal.     ?   Behavior: Behavior normal.     ?   Thought Content: Thought content normal.     ?   Judgment: Judgment normal.  ? ?CBC ?   ?Component Value Date/Time  ? WBC 6.7 09/06/2021 0838  ? RBC 4.23 09/06/2021 0838  ? HGB 13.3 09/06/2021 0838  ? HGB 13.1 05/09/2021 0927  ? HCT 39.9 09/06/2021 0838  ? PLT 279.0 09/06/2021 0838  ? PLT 325 05/09/2021 0927  ? MCV 94.2 09/06/2021 0838  ? MCH 30.5 05/09/2021 0927  ? MCHC 33.4 09/06/2021 0838  ? RDW 13.7 09/06/2021 0838  ? LYMPHSABS 1.2 09/06/2021 0838  ? MONOABS 0.4 09/06/2021 0838  ? EOSABS 0.1 09/06/2021 0838  ? BASOSABS 0.0 09/06/2021 0838  ? ? ?  Latest Ref Rng & Units 09/06/2021  ?  8:38 AM 05/09/2021  ?   9:27 AM 04/18/2021  ?  3:40 PM  ?BMP  ?Glucose 70 - 99 mg/dL 93   102   100    ?BUN 6 - 23 mg/dL '15   18   19    '$ ?Creatinine 0.40 - 1.20 mg/dL 0.80   0.94   0.87    ?Sodium 135 - 145 mEq/L 140   142   139    ?Potassium 3.5 -

## 2021-09-19 ENCOUNTER — Inpatient Hospital Stay: Payer: Medicare HMO | Admitting: Hematology

## 2021-09-19 ENCOUNTER — Encounter: Payer: Self-pay | Admitting: Hematology

## 2021-09-19 ENCOUNTER — Inpatient Hospital Stay: Payer: Medicare HMO | Attending: Hematology

## 2021-09-19 ENCOUNTER — Other Ambulatory Visit: Payer: Self-pay

## 2021-09-19 VITALS — BP 138/81 | HR 87 | Temp 98.4°F | Resp 18 | Ht 65.0 in | Wt 188.2 lb

## 2021-09-19 DIAGNOSIS — F419 Anxiety disorder, unspecified: Secondary | ICD-10-CM | POA: Diagnosis not present

## 2021-09-19 DIAGNOSIS — C50211 Malignant neoplasm of upper-inner quadrant of right female breast: Secondary | ICD-10-CM

## 2021-09-19 DIAGNOSIS — Z170 Estrogen receptor positive status [ER+]: Secondary | ICD-10-CM | POA: Diagnosis not present

## 2021-09-19 DIAGNOSIS — M810 Age-related osteoporosis without current pathological fracture: Secondary | ICD-10-CM | POA: Insufficient documentation

## 2021-09-19 DIAGNOSIS — M1711 Unilateral primary osteoarthritis, right knee: Secondary | ICD-10-CM | POA: Insufficient documentation

## 2021-09-19 DIAGNOSIS — R69 Illness, unspecified: Secondary | ICD-10-CM | POA: Diagnosis not present

## 2021-09-19 LAB — CBC WITH DIFFERENTIAL (CANCER CENTER ONLY)
Abs Immature Granulocytes: 0.02 10*3/uL (ref 0.00–0.07)
Basophils Absolute: 0 10*3/uL (ref 0.0–0.1)
Basophils Relative: 0 %
Eosinophils Absolute: 0.1 10*3/uL (ref 0.0–0.5)
Eosinophils Relative: 1 %
HCT: 38.2 % (ref 36.0–46.0)
Hemoglobin: 13.2 g/dL (ref 12.0–15.0)
Immature Granulocytes: 0 %
Lymphocytes Relative: 20 %
Lymphs Abs: 1.4 10*3/uL (ref 0.7–4.0)
MCH: 31.4 pg (ref 26.0–34.0)
MCHC: 34.6 g/dL (ref 30.0–36.0)
MCV: 91 fL (ref 80.0–100.0)
Monocytes Absolute: 0.4 10*3/uL (ref 0.1–1.0)
Monocytes Relative: 5 %
Neutro Abs: 5.3 10*3/uL (ref 1.7–7.7)
Neutrophils Relative %: 74 %
Platelet Count: 284 10*3/uL (ref 150–400)
RBC: 4.2 MIL/uL (ref 3.87–5.11)
RDW: 12.9 % (ref 11.5–15.5)
WBC Count: 7.2 10*3/uL (ref 4.0–10.5)
nRBC: 0 % (ref 0.0–0.2)

## 2021-09-19 LAB — CMP (CANCER CENTER ONLY)
ALT: 19 U/L (ref 0–44)
AST: 18 U/L (ref 15–41)
Albumin: 4.3 g/dL (ref 3.5–5.0)
Alkaline Phosphatase: 88 U/L (ref 38–126)
Anion gap: 7 (ref 5–15)
BUN: 13 mg/dL (ref 8–23)
CO2: 24 mmol/L (ref 22–32)
Calcium: 9.5 mg/dL (ref 8.9–10.3)
Chloride: 108 mmol/L (ref 98–111)
Creatinine: 0.83 mg/dL (ref 0.44–1.00)
GFR, Estimated: >60 mL/min (ref >60)
Glucose, Bld: 102 mg/dL — ABNORMAL HIGH (ref 70–99)
Potassium: 4 mmol/L (ref 3.5–5.1)
Sodium: 139 mmol/L (ref 135–145)
Total Bilirubin: 0.5 mg/dL (ref 0.3–1.2)
Total Protein: 7.5 g/dL (ref 6.5–8.1)

## 2021-09-19 NOTE — Progress Notes (Signed)
?[place]   ?[phone] [phone]   ?Clinic Follow up Note  ? ?Patient Care Team: ?Carollee Herter, Alferd Apa, DO as PCP - General (Family Medicine) ?Sable Feil, MD as Consulting Physician (Gastroenterology) ?Hollar, Katharine Look, MD as Referring Physician (Dermatology) ?Lynnell Dike, OD as Consulting Physician (Optometry) ?Stark Klein, MD as Consulting Physician (General Surgery) ?Truitt Merle, MD as Consulting Physician (Hematology) ?Alla Feeling, NP as Nurse Practitioner (Nurse Practitioner) ?Freddi Starr, MD as Consulting Physician (Pulmonary Disease) ? ?Date of Service:  09/19/2021 ? ?CHIEF COMPLAINT: f/u of right breast cancer ? ?CURRENT THERAPY:  ?-Anastrozole 1 mg daily starting 04/2020 ?-Zometa q6 months x2 years starting 10/06/20 ? ?ASSESSMENT & PLAN:  ?Katherine Hanna is a 66 y.o. post-menopausal female with  ? ?1. Malignant neoplasm of upper-inner quadrant of right breast, Stage 1A, p(T1cN1aM0), ER+/PR+/HER2-, Grade II  ?-Diagnosed in 11/2019. S/p right lumpectomy and SLNB with Dr Barry Dienes on 01/20/20 showing 1.3 cm IDC and DCIS. Margins negative, one node positive with focal ECE (1/4). ?-Mammaprint was low risk Luminal Type A.  ?-She completed adjuvant radiation.  ?-She began adjuvant antiestrogen therapy with Anastrozole in 04/2020. She is tolerating well with mild hot flashes and stiffness in the right hand.  Side effects are tolerable ?-First mammogram 12/13/20 showed postop changes, negative for malignancy in either breast ?-she is clinically doing well. Labs reviewed, WNL. Physical exam showed only some hard tissue near her incision site, likely related to surgery. She notes this has not changed, and adds that Dr. Barry Dienes was not concerned at their last visit in 07/2021. ?-Continue AI and surveillance ?  ?2. Anxiety, Arthritis  ?-She was previously on Zoloft right before her cancer diagnosis.  ?-She mainly has arthritis in her thumbs, right hand and right  knee. Pain is occasional and manageable.  ?-denies issues on anastrozole ?  ?3. Osteoporosis  ?-Her 10/2017 DEXA showed osteoporosis at right femur neck with lowest T-score -2.8. Previously did not tolerate Prolia. ?-Switched to Zometa every 6 months x2 years starting 10/06/20 after receiving dental clearance. Next dose scheduled for 11/07/21. ?-BMD from 12/13/20 improved from osteoporosis to osteopenia at the right femur neck, T score now -2.3 ?-Continue calcium, vitamin D, weightbearing exercise, and Zometa ?  ?4.  Health maintenance ?-She is up-to-date on Pap screening, due for colonoscopy.  She will call Dr. Steve Rattler office to schedule ?  ?5. Bronchitis ?-Flu in 02/2021, subsequently developed bronchitis ?-s/p multiple antibiotics and prednisone tapers.  ?-CT 04/19/21 shows no true underlying pulmonary nodule, possible radiation changes in the right lung, and airway plugging in both lower lobes ?-now followed by Dr. Erin Fulling ? ? ?PLAN: ?-continue anastrozole ?-Zometa on 11/07/21 as scheduled, okay to use today's lab for Zometa treatment  ?-mammogram due 11/2021, order in place by PCP ?-lab, f/u, and Zometa around 05/09/22, patient will see Dr. Barry Dienes in the interim ? ? ?No problem-specific Assessment & Plan notes found for this encounter. ? ? ?SUMMARY OF ONCOLOGIC HISTORY: ?Oncology History Overview Note  ?Cancer Staging ?Malignant neoplasm of upper-inner quadrant of right breast in female, estrogen receptor positive ([place]) ?Staging form: Breast, AJCC 8th Edition ?- Clinical stage from 12/03/2019: Stage IA (cT1c, cN0, cM0, G2, ER+, PR+, HER2-) - Signed by Truitt Merle, MD on 12/09/2019 ?- Pathologic stage from 01/20/2020: Stage IA (pT1c, pN1a, cM0, G2, ER+, PR+, HER2-) - Signed by Truitt Merle, MD on 04/06/2020 ? ?  ?Malignant neoplasm of upper-inner quadrant of right breast in female, estrogen  receptor positive ([place])  ?11/25/2019 Mammogram  ? Mammogram and [place] 11/25/19  ?IMPRESSION ?The 1.1x0.8x0.9cm architectural distortion in the  right breast at 3:00 position middle depth 4 cm from nipple is highly suggestive of malignancy. An US guided biopsy is recommended.  ? ?  ?12/03/2019 Initial Biopsy  ? Diagnosis 12/03/19 ?Breast, right, needle core biopsy, 3 o'clock, 4 cmfn ?- INVASIVE DUCTAL CARCINOMA. ?- DUCTAL CARCINOMA IN SITU. ?Microscopic Comment ?The carcinoma appears grade 2. The greatest linear extent of tumor in any one core is 10 mm. Ancillary studies will be ?reported separately. Results reported to [facility] on 12/04/2019. Intradepartmental consultation ?(Dr. Melina Copa). ?  ?12/03/2019 Receptors her2  ? PROGNOSTIC INDICATORS ?Results: ?IMMUNOHISTOCHEMICAL AND MORPHOMETRIC ANALYSIS PERFORMED MANUALLY ?The tumor cells are EQUIVOCAL for Her2 (2+). Her2 by FISH will be performed and results reported separately. ?Estrogen Receptor: 95%, POSITIVE, STRONG STAINING INTENSITY ?Progesterone Receptor: 80%, POSITIVE, STRONG STAINING INTENSITY ?Proliferation Marker Ki67: 10% ? ? ?FLUORESCENCE IN-SITU HYBRIDIZATION ?Results: ?GROUP 5: HER2 **NEGATIVE** ?Equivocal form of amplification of the HER2 gene was detected in the IHC 2+ tissue sample received from this ?individual. HER2 FISH was performed by a technologist and cell imaging and analysis on the [employer]. ?  ?12/03/2019 Cancer Staging  ? Staging form: Breast, AJCC 8th Edition ?- Clinical stage from 12/03/2019: Stage IA (cT1c, cN0, cM0, G2, ER+, PR+, HER2-) - Signed by Truitt Merle, MD on 12/09/2019 ? ?  ?12/08/2019 Initial Diagnosis  ? Malignant neoplasm of upper-inner quadrant of right breast in female, estrogen receptor positive ([place]) ?  ?01/20/2020 Surgery  ? RIGHT BREAST LUMPECTOMY WITH RADIOACTIVE SEED AND SENTINEL LYMPH NODE BIOPSY by Barry Dienes  ?  ?01/20/2020 Pathology Results  ? FINAL MICROSCOPIC DIAGNOSIS:  ? ?A. BREAST, RIGHT, LUMPECTOMY:  ?- Invasive ductal carcinoma, grade 2, spanning 1.3 cm.  ?- Intermediate grade ductal carcinoma in situ.  ?- Invasive carcinoma is <1 mm from the  lateral margin focally.  ?- In situ carcinoma is 3 mm from the lateral margin focally.  ?- Biopsy site.  ?- Fibrocystic change and usual ductal hyperplasia.  ?- See oncology table.  ? ?B. LYMPH NODE, RIGHT AXILLARY #1, SENTINEL, EXCISION:  ?- Metastatic carcinoma in one of one lymph nodes (1/1).  ?- Focal extracapsular extension.  ? ?C. LYMPH NODE, RIGHT AXILLARY #2, SENTINEL, EXCISION:  ?- One of one lymph nodes negative for carcinoma (0/1).  ? ?D. LYMPH NODE, RIGHT AXILLARY #3, SENTINEL, EXCISION:  ?- One of one lymph nodes negative for carcinoma (0/1).  ? ?E. LYMPH NODE, RIGHT AXILLARY #4, SENTINEL, EXCISION:  ?- One of one lymph nodes negative for carcinoma (0/1).  ? ? ? ?ADDENDUM:  ? ?By immunohistochemistry, HER-2 is EQUIVOCAL (2+).  HER-2 by FISH is  ?pending and will be reported in an addendum.  ? ?ADDENDUM:  ? ?FLOURESCENCE IN-SITU HYBRIDIZATION RESULTS:  ? ?GROUP 5:   HER2 **NEGATIVE**  ?  ?01/20/2020 Miscellaneous  ? Mammaprint Low risk Luminal Type A - MPI at +0.311 ?She has 97.8% benefit of Horomal Therpay alone  ?  ?01/20/2020 Cancer Staging  ? Staging form: Breast, AJCC 8th Edition ?- Pathologic stage from 01/20/2020: Stage IA (pT1c, pN1a, cM0, G2, ER+, PR+, HER2-) - Signed by Truitt Merle, MD on 04/06/2020 ? ?  ?02/26/2020 - 04/13/2020 Radiation Therapy  ? Adjuvant Radiation with Dr Sondra Come  ?  ?04/2020 -  Anti-estrogen oral therapy  ? Anastrozole $RemoveBefor'1mg'BnrPKiLhTcWi$  daily starting in 04/2020  ?  ?07/13/2020 Survivorship  ? SCP delivered virtually by Cira Rue,  NP  ?  ? ? ? ?INTERVAL HISTORY:  ?Katherine Hanna is here for a follow up of breast cancer. She was last seen by NP Lacie on 05/09/22. She presents to the clinic alone. ?She reports she is doing well overall, no new concerns. She tells me she is now being seen by pulmonology and that her inhaler was changed recently. ?  ?All other systems were reviewed with the patient and are negative. ? ?MEDICAL HISTORY:  ?Past Medical History:  ?Diagnosis Date  ? Allergic rhinitis    ? Arthritis   ? knee  ? Asthma   ? Breast cancer ([place])   ? History of chicken pox   ? Migraine   ? Rheumatic fever   ? as a child.  ? ? ?SURGICAL HISTORY: ?Past Surgical History:  ?Procedure Laterality Da

## 2021-11-07 ENCOUNTER — Other Ambulatory Visit: Payer: Self-pay

## 2021-11-07 ENCOUNTER — Inpatient Hospital Stay: Payer: Medicare HMO | Attending: Hematology

## 2021-11-07 VITALS — BP 146/76 | HR 85 | Temp 97.7°F | Resp 18

## 2021-11-07 DIAGNOSIS — M810 Age-related osteoporosis without current pathological fracture: Secondary | ICD-10-CM | POA: Insufficient documentation

## 2021-11-07 DIAGNOSIS — C50211 Malignant neoplasm of upper-inner quadrant of right female breast: Secondary | ICD-10-CM | POA: Insufficient documentation

## 2021-11-07 DIAGNOSIS — Z170 Estrogen receptor positive status [ER+]: Secondary | ICD-10-CM | POA: Diagnosis not present

## 2021-11-07 DIAGNOSIS — M816 Localized osteoporosis [Lequesne]: Secondary | ICD-10-CM

## 2021-11-07 MED ORDER — SODIUM CHLORIDE 0.9 % IV SOLN: Freq: Once | INTRAVENOUS | Status: AC

## 2021-11-07 MED ORDER — ZOLEDRONIC ACID 4 MG/100ML IV SOLN
4.0000 mg | Freq: Once | INTRAVENOUS | Status: AC
  Administered 2021-11-07: 4 mg via INTRAVENOUS
  Filled 2021-11-07: qty 100

## 2021-11-07 NOTE — Progress Notes (Signed)
Patient confirms that she does not have any upcoming dental work.

## 2021-11-07 NOTE — Patient Instructions (Signed)

## 2021-11-14 ENCOUNTER — Ambulatory Visit: Payer: Medicare HMO | Attending: General Surgery

## 2021-11-14 VITALS — Wt 188.4 lb

## 2021-11-14 DIAGNOSIS — Z483 Aftercare following surgery for neoplasm: Secondary | ICD-10-CM | POA: Insufficient documentation

## 2021-12-14 DIAGNOSIS — R922 Inconclusive mammogram: Secondary | ICD-10-CM | POA: Diagnosis not present

## 2021-12-14 DIAGNOSIS — Z853 Personal history of malignant neoplasm of breast: Secondary | ICD-10-CM | POA: Diagnosis not present

## 2021-12-14 LAB — HM MAMMOGRAPHY

## 2021-12-26 IMAGING — DX DG CHEST 2V
2 series · 2 of 2 positions shown · non-contrast
Comparison: Comparison is made with June 26, 2014.

CLINICAL DATA: Cough, rule out pneumonia in a 65-year-old female.

EXAM:
CHEST - 2 VIEW

[chest pa]
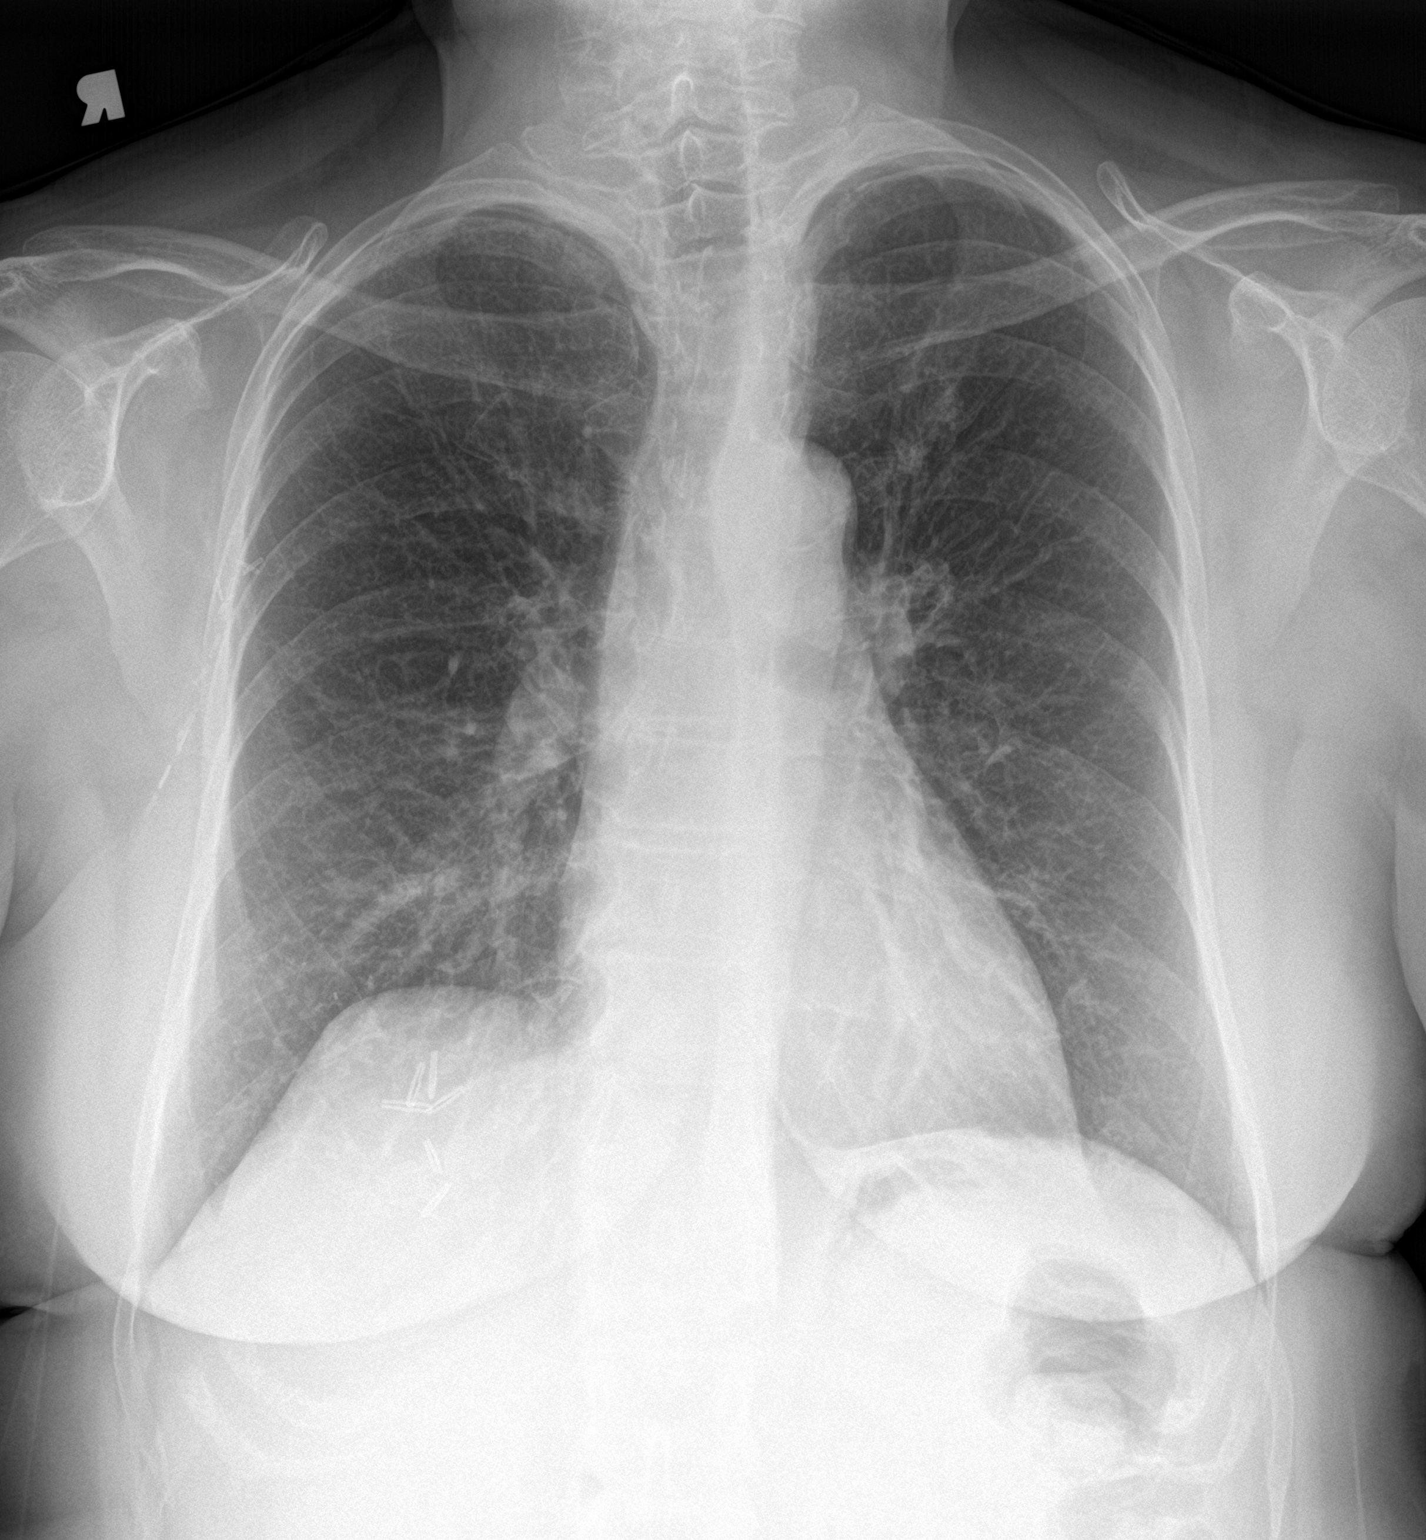

[chest lat]
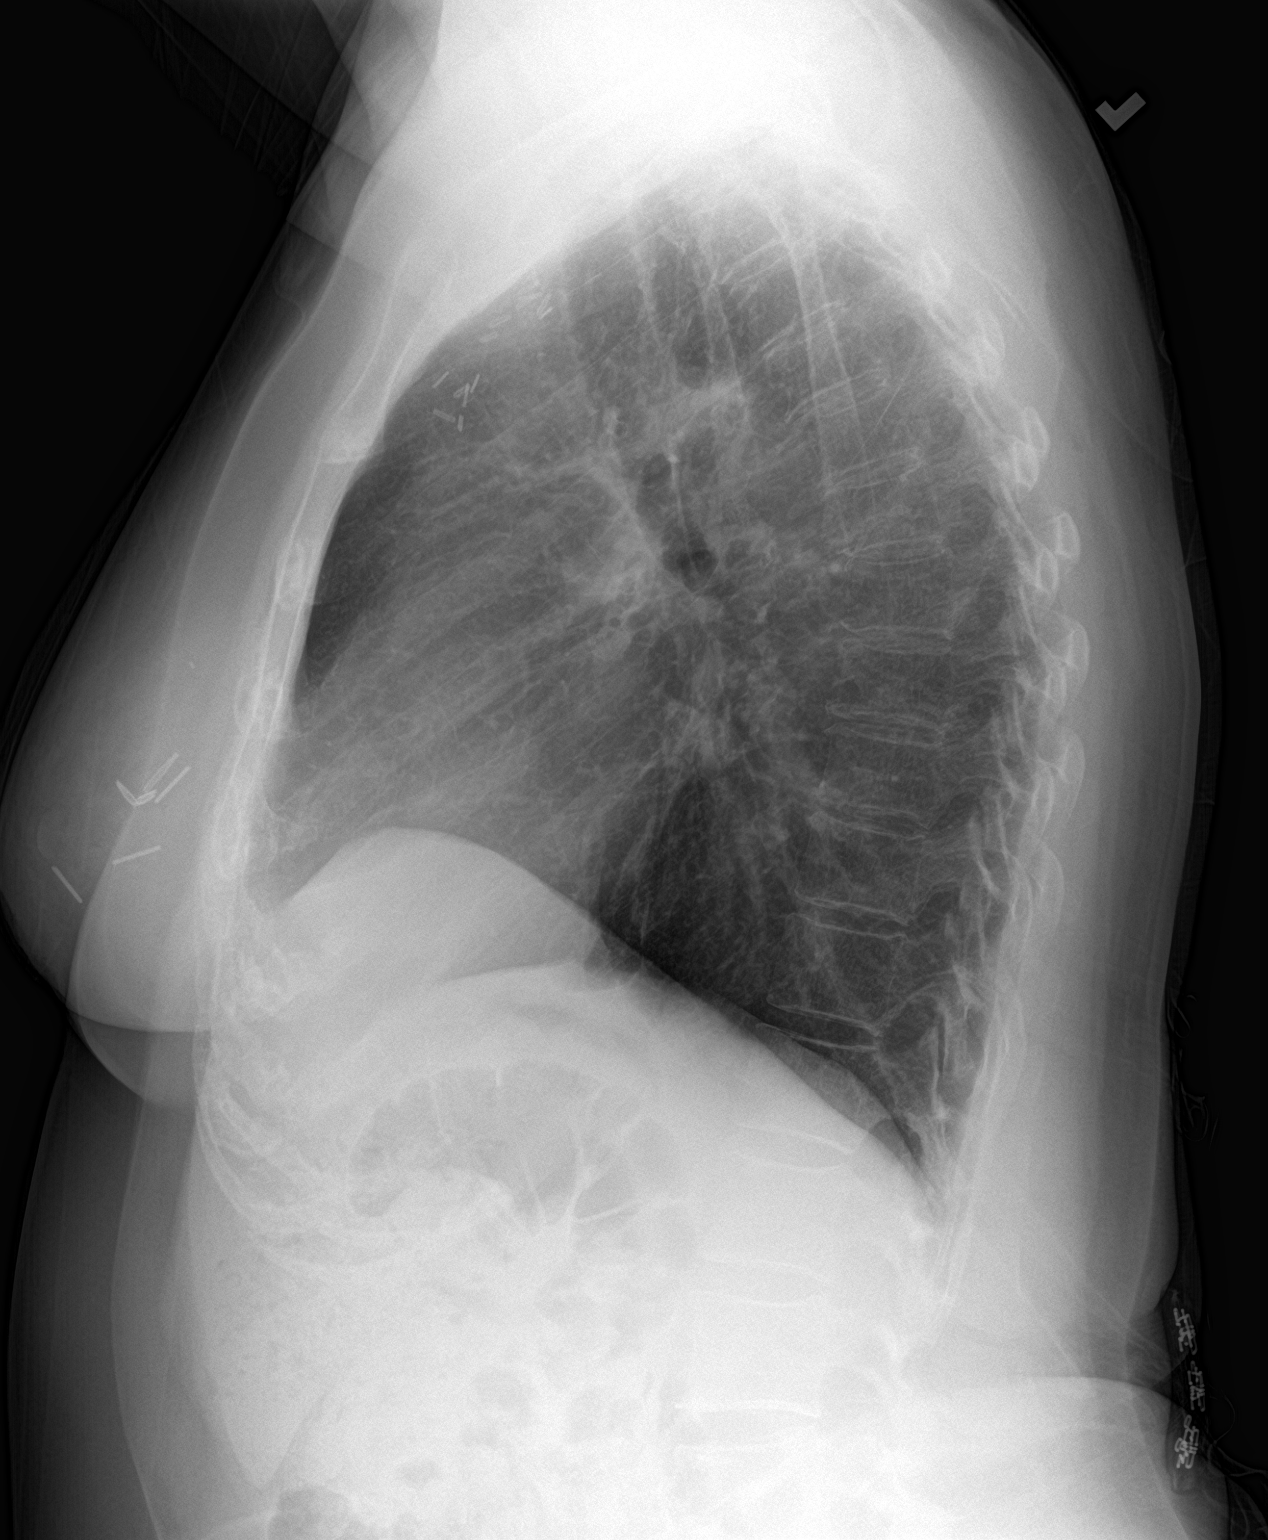

[2 of 2 positions shown; findings below may reference images not displayed]

FINDINGS: Trachea midline.

Cardiomediastinal contours and hilar structures are normal. No signs
of pleural effusion.

No lobar consolidation.

On limited assessment there is no acute skeletal process.

Signs of RIGHT lumpectomy and axillary dissection.

Asymmetric density in the LEFT upper lobe present to some extent on
previous imaging but appearing more nodular on the current study up
to 7 mm projecting between the fifth and sixth ribs on the AP
projection.
IMPRESSION: No acute cardiopulmonary disease.

Asymmetric density in the LEFT upper lobe present to some extent on
previous imaging but appearing more nodular on the current study
projecting between the fifth and sixth ribs on the AP projection.
The setting of acute respiratory symptoms consider 4-6 week
follow-up with PA and lateral chest radiograph after treatment as
appropriate. Could also consider follow-up chest CT in this patient
with history of breast cancer.

## 2021-12-27 IMAGING — CT CT CHEST W/O CM
2 of 3 series · 15 of 36 positions shown, 18 images · non-contrast
Comparison: Chest radiograph 04/18/2021

CLINICAL DATA: Cough.  Persistent lung nodule.

EXAM:
CT CHEST WITHOUT CONTRAST
TECHNIQUE: Multidetector CT imaging of the chest was performed following the
standard protocol without IV contrast.

[Series 2: thorax · axial · 0.65mm/px · z∈[-291,-19]mm · 12 of 160 slices shown, 15 images]
[im 12/160  mediastinal]
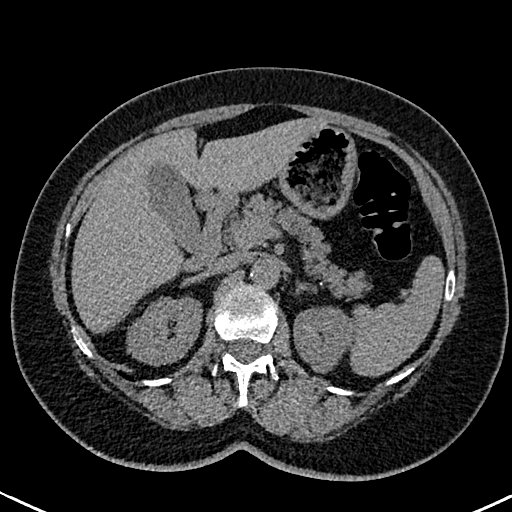
[im 12/160  lung]
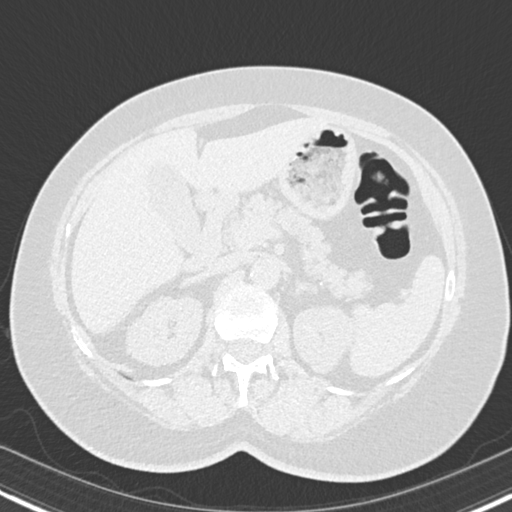
[im 24/160  lung]
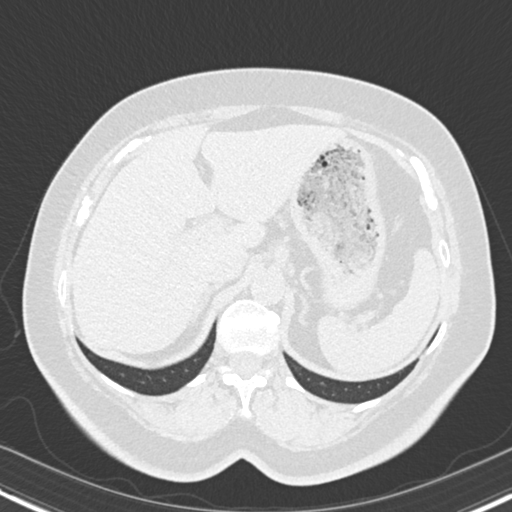
[im 36/160  lung]
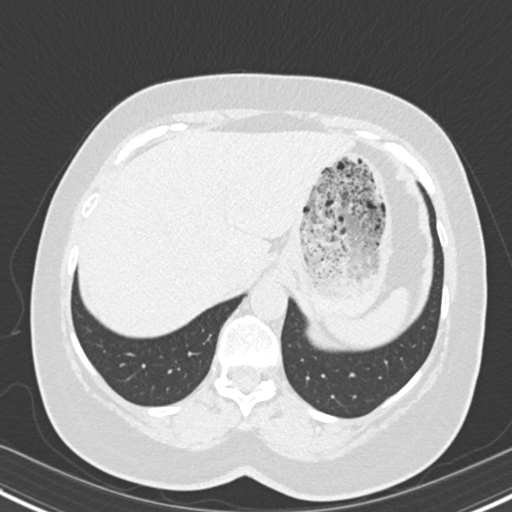
[im 48/160  lung]
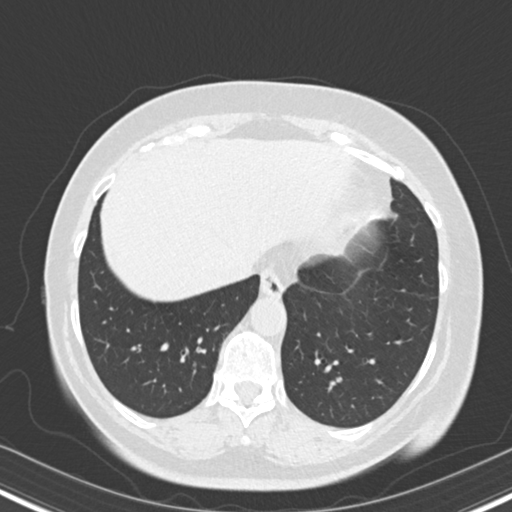
[im 59/160  mediastinal]
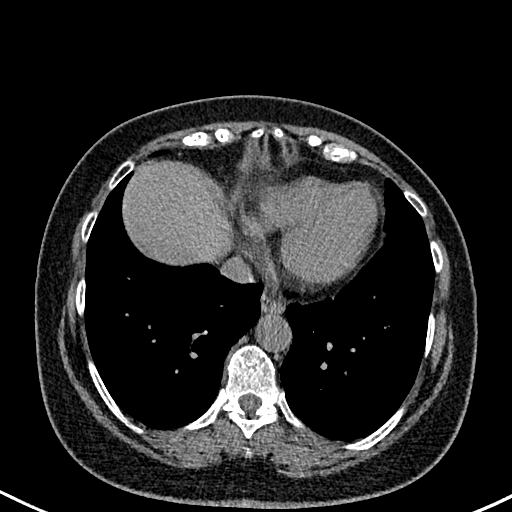
[im 59/160  lung]
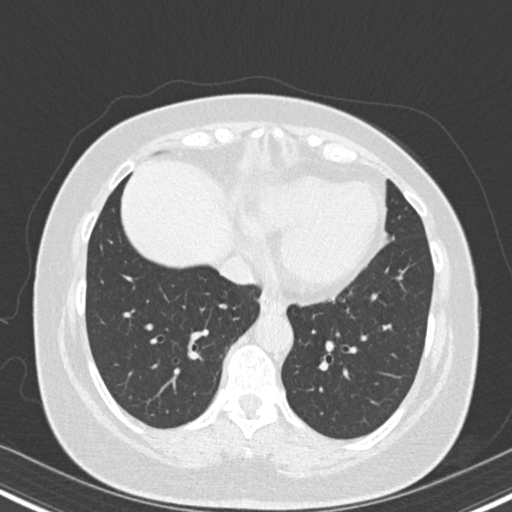
[im 71/160  lung]
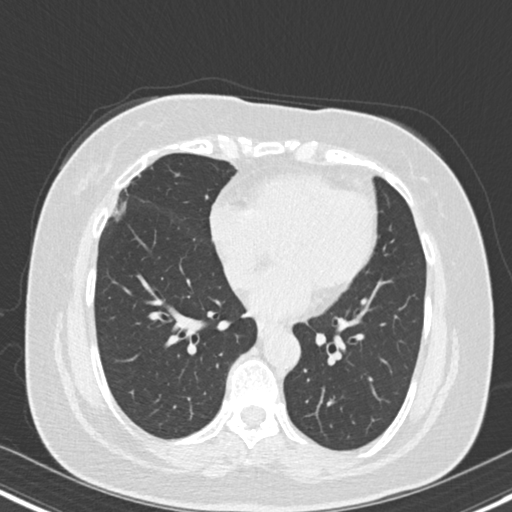
[im 89/160  lung]
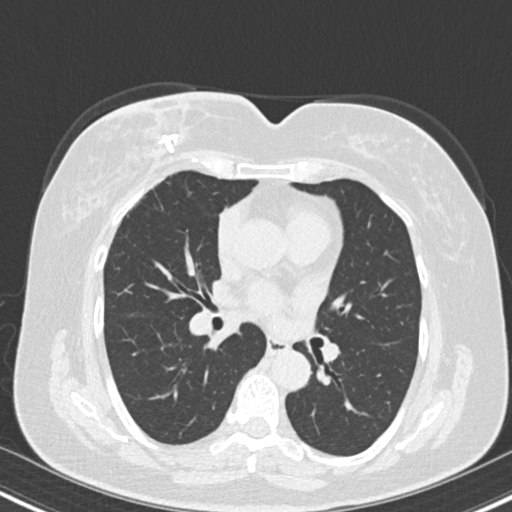
[im 101/160  lung]
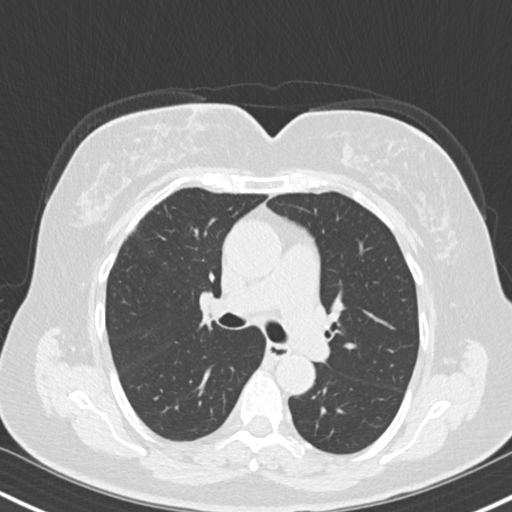
[im 112/160  mediastinal]
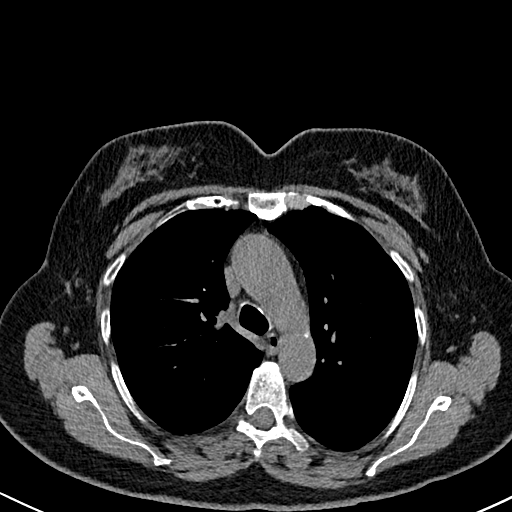
[im 112/160  lung]
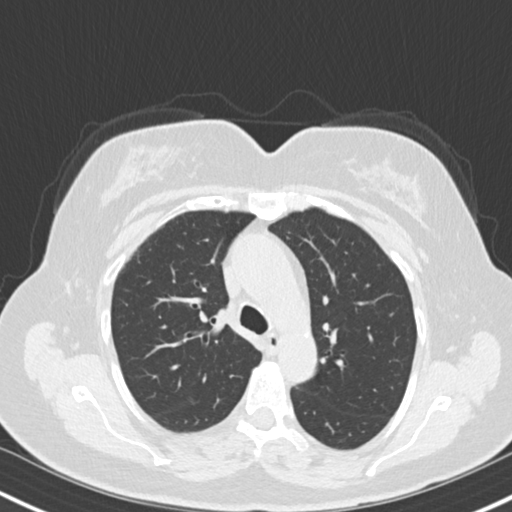
[im 124/160  lung]
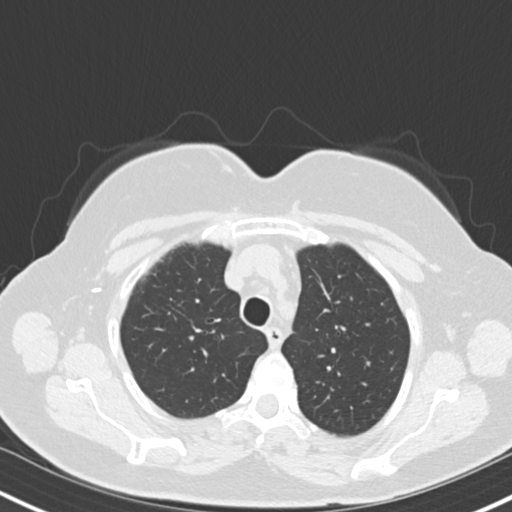
[im 136/160  lung]
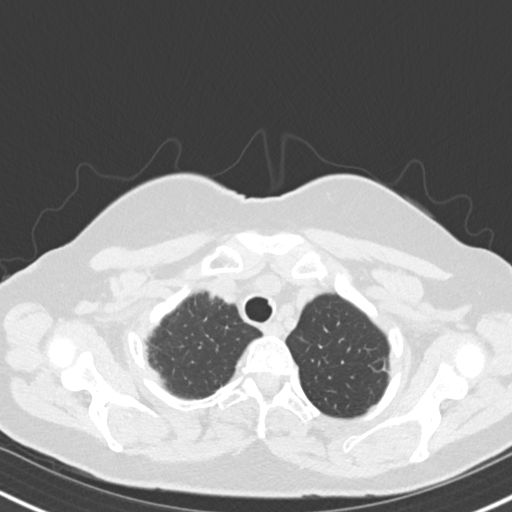
[im 148/160  lung]
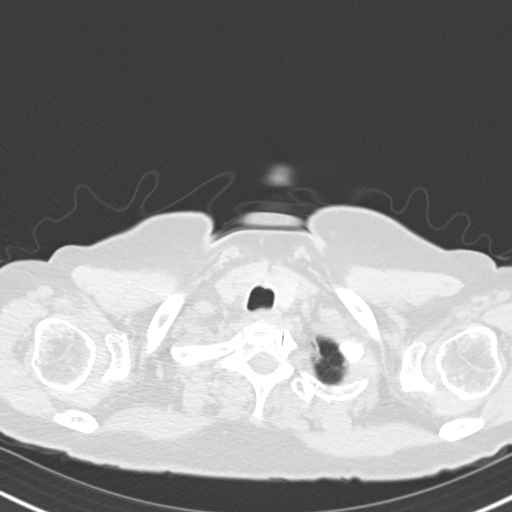

[Series 5: coronal · coronal · 0.61mm/px · 3 of 148 slices shown]
[im 30/148  lung]
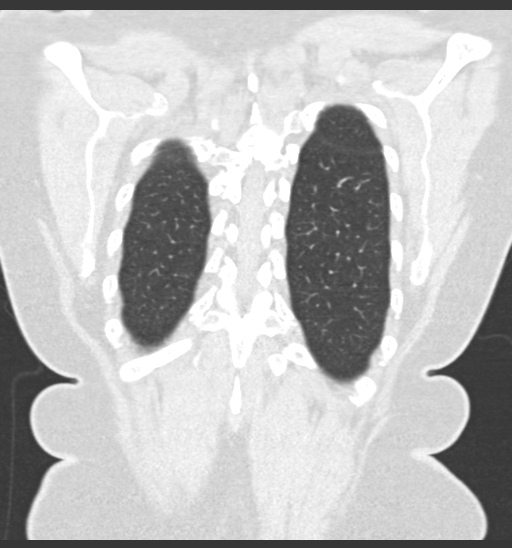
[im 59/148  lung]
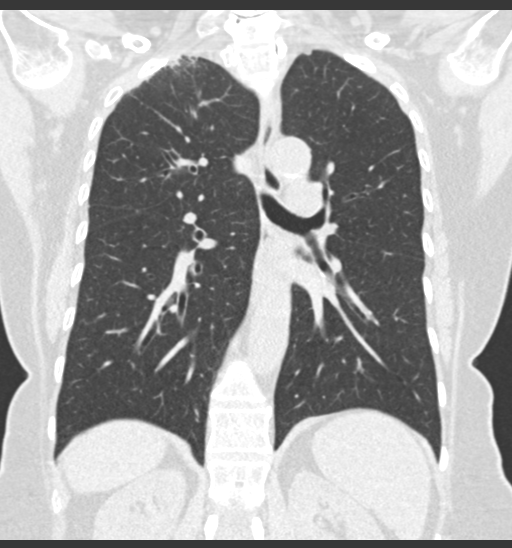
[im 89/148  lung]
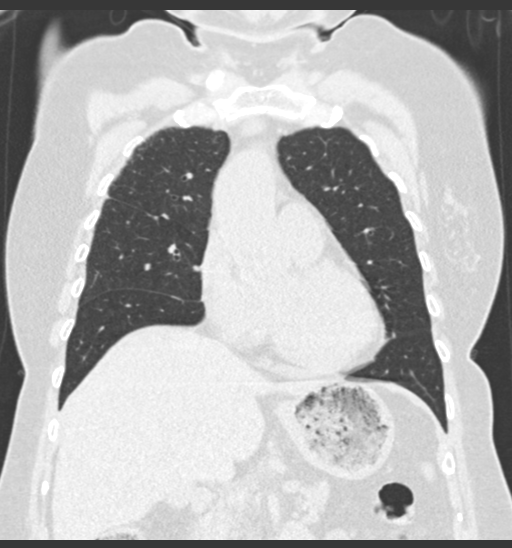

[15 of 36 positions shown; findings below may reference images not displayed]

FINDINGS: Cardiovascular: Mild lower thoracic and upper abdominal aortic
atherosclerotic vascular disease.

Mediastinum/Nodes: No pathologic adenopathy identified. Prior right
axillary dissection along with some staples medially in the right
breast.

Lungs/Pleura: Interstitial accentuation anteriorly in the right
lung, some of which may be therapy related. This extends up to the
level of the lung apex.

There is some mild airway plugging in the posterior basal segment
right lower lobe for example on image 120 series 3. Minimal airway
plugging in the left lower lobe. The nodular finding concern on
chest radiography corresponds to some asymmetric sclerotic spurring
of the distal margin of the left first rib about on level 30 of
series 3; no true underlying left upper lobe pulmonary nodule is
present in this vicinity.

Upper Abdomen: As noted above there is some atherosclerotic
calcification in the upper abdominal aorta.

Musculoskeletal: Unremarkable
IMPRESSION: 1. The density of concern on chest radiography corresponds to some
sclerotic spurring of the left anterior first rib costochondral
junction. There is no true underlying pulmonary nodule in this
vicinity.
2. Hazy interstitial accentuation anteriorly in the right lung is
likely primarily related to prior radiation therapy given the other
findings of prior breast surgery and right axillary dissection.
3. Minimal airway plugging in both lower lobes.
4.  Aortic Atherosclerosis ([id number]).

## 2022-01-16 ENCOUNTER — Ambulatory Visit (INDEPENDENT_AMBULATORY_CARE_PROVIDER_SITE_OTHER): Payer: Medicare HMO | Admitting: Pulmonary Disease

## 2022-01-16 ENCOUNTER — Ambulatory Visit: Payer: Medicare HMO | Admitting: Pulmonary Disease

## 2022-01-16 ENCOUNTER — Encounter: Payer: Self-pay | Admitting: Pulmonary Disease

## 2022-01-16 VITALS — BP 116/78 | HR 74 | Temp 97.8°F | Ht 65.0 in | Wt 182.0 lb

## 2022-01-16 DIAGNOSIS — J4520 Mild intermittent asthma, uncomplicated: Secondary | ICD-10-CM | POA: Diagnosis not present

## 2022-01-16 LAB — PULMONARY FUNCTION TEST
DL/VA % pred: 123 %
DL/VA: 5.08 ml/min/mmHg/L
DLCO cor % pred: 105 %
DLCO cor: 21.53 ml/min/mmHg
DLCO unc % pred: 105 %
DLCO unc: 21.53 ml/min/mmHg
FEF 25-75 Post: 2.27 L/sec
FEF 25-75 Pre: 2.12 L/sec
FEF2575-%Change-Post: 6 %
FEF2575-%Pred-Post: 108 %
FEF2575-%Pred-Pre: 101 %
FEV1-%Change-Post: 1 %
FEV1-%Pred-Post: 87 %
FEV1-%Pred-Pre: 86 %
FEV1-Post: 2.15 L
FEV1-Pre: 2.12 L
FEV1FVC-%Change-Post: 2 %
FEV1FVC-%Pred-Pre: 106 %
FEV6-%Change-Post: -1 %
FEV6-%Pred-Post: 83 %
FEV6-%Pred-Pre: 84 %
FEV6-Post: 2.57 L
FEV6-Pre: 2.6 L
FEV6FVC-%Pred-Post: 104 %
FEV6FVC-%Pred-Pre: 104 %
FVC-%Change-Post: -1 %
FVC-%Pred-Post: 79 %
FVC-%Pred-Pre: 80 %
FVC-Post: 2.57 L
FVC-Pre: 2.6 L
Post FEV1/FVC ratio: 84 %
Post FEV6/FVC ratio: 100 %
Pre FEV1/FVC ratio: 82 %
Pre FEV6/FVC Ratio: 100 %
RV % pred: 68 %
RV: 1.5 L
TLC % pred: 83 %
TLC: 4.36 L

## 2022-01-16 NOTE — Progress Notes (Signed)
Synopsis: Referred in January 2023 for bronchitis by Vanna Scotland, DO  Subjective:   PATIENT ID: Katherine Hanna GENDER: female DOB: [birth year], MRN: [id number]  HPI  Chief Complaint  Patient presents with   Follow-up    PFT's done today. She has been off of advair for approx 2 months and feeling okay without it. She has occ dry cough. She rarely uses albuterol.    Katherine Hanna is a 66 year old woman, never smoker with history of breast cancer and allergic rhinitis who returns to pulmonary clinic for reactive airways disease.  PFTs today show normal lung function  She has not required advair HFA 115-19mg 2 puffs twice daily with spacer over the past 2 months. She denies issues with wheezing, cough or dyspnea at this time.   OV 09/08/21 She was transitioned from flovent to advair HFA 115-226m 2 puffs twice daily with spacer at last visit. She tried to taper down to 1 puff twice a day but reports a return of her symptoms. Once resuming 2 puffs twice daily she felt better. She is planning to continue the inhaler through out the spring pollen season and then attempt to taper off once again.  She has noticed intermittent blood tinged nasal secretions. She has occasional post-nasal drainage.  OV 05/31/21 She reports developing upper respiratory viral infection 9 to 10 weeks ago. She tested negative for covid at that time. She thought she had the flu. She had cough, wheezing and shortness of breath along with fatigue. She was initially treated with a zpak without much improvement. She was then treated with a course of prednisone and another zpak with some improvement. She completed 2 more rounds of antibiotics and another round of prednisone with some improvement and was then placed on flovent 4436m2 puffs twice daily with spacer and PRN albuterol inhaler. She reports the cough is much better at this time and is not experiencing wheezing now. She does have some sinus congestion with post nasal  drainage. Denies significant reflux symptoms. She is taking zyrtec for seasonal allergies.   She reports having intermittent wheezing with previous episodes of viral infections that did not take this long to resolve.   She had CT scan 11/29 which showed anterior peripheral fibrotic changes of the right upper lobe secondary to breast radiation. She has minimal mucous plugging of the right lower lobe distal airways. No opacities or infusions.   Past Medical History:  Diagnosis Date   Allergic rhinitis    Arthritis    knee   Asthma    Breast cancer ([facility]  History of chicken pox    Migraine    Rheumatic fever    as a child.     Family History  Problem Relation Age of Onset   Lung cancer Mother        smoked   Diabetes Father    Stroke Father    Cancer Father        bladder   Leukemia Father    Stroke Maternal Grandmother        [id number] artery disease Maternal Grandfather    Stroke Paternal Grandmother    Colon cancer Maternal Uncle    Breast cancer Paternal Aunt    Esophageal cancer Neg Hx      Social History   Socioeconomic History   Marital status: Married    Spouse name: Not on file   Number of children: 2   Years of education:  Not on file   Highest education level: Not on file  Occupational History   Occupation: Engineer, maintenance (IT)  Tobacco Use   Smoking status: Never   Smokeless tobacco: Never  Vaping Use   Vaping Use: Never used  Substance and Sexual Activity   Alcohol use: No   Drug use: No   Sexual activity: Yes    Partners: Male  Other Topics Concern   Not on file  Social History Narrative   Exercise---   no    Social Determinants of Health   Financial Resource Strain: Low Risk  (08/11/2021)   Overall Financial Resource Strain (CARDIA)    Difficulty of Paying Living Expenses: Not hard at all  Food Insecurity: No Food Insecurity (08/11/2021)   Hunger Vital Sign    Worried About Running Out of Food in the Last Year: Never true    Ran Out of Food in the  Last Year: Never true  Transportation Needs: No Transportation Needs (08/11/2021)   PRAPARE - Hydrologist (Medical): No    Lack of Transportation (Non-Medical): No  Physical Activity: Not on file  Stress: No Stress Concern Present (08/11/2021)   [place]    Feeling of Stress : Not at all  Social Connections: Moderately Integrated (08/11/2021)   Social Connection and Isolation Panel [NHANES]    Frequency of Communication with Friends and Family: More than three times a week    Frequency of Social Gatherings with Friends and Family: Once a week    Attends Religious Services: More than 4 times per year    Active Member of [employer] or Organizations: No    Attends Archivist Meetings: Never    Marital Status: Married  Human resources officer Violence: Not At Risk (08/11/2021)   Humiliation, Afraid, Rape, and Kick questionnaire    Fear of Current or Ex-Partner: No    Emotionally Abused: No    Physically Abused: No    Sexually Abused: No     Allergies  Allergen Reactions   Penicillins Rash    Rash head to toe     Outpatient Medications Prior to Visit  Medication Sig Dispense Refill   albuterol (VENTOLIN HFA) 108 (90 Base) MCG/ACT inhaler Inhale 2 puffs into the lungs every 6 (six) hours as needed for wheezing or shortness of breath. 8 g 0   anastrozole (ARIMIDEX) 1 MG tablet Take 1 tablet (1 mg total) by mouth daily. 90 tablet 3   Calcium Carbonate-Vitamin D (CALCIUM 500 + D PO) Take 1 tablet by mouth 2 (two) times daily.     cetirizine (ZYRTEC) 10 MG tablet Take 10 mg by mouth at bedtime.     co-enzyme Q-10 30 MG capsule Take 30 mg by mouth daily.     Misc Natural Products (TART CHERRY ADVANCED PO) Take by mouth.     Multiple Vitamin (MULTIVITAMIN) tablet Take 1 tablet by mouth daily.     TURMERIC PO Take by mouth.     fluticasone-salmeterol (ADVAIR HFA) 115-21 MCG/ACT inhaler Inhale 2  puffs into the lungs 2 (two) times daily. (Patient not taking: Reported on 11/07/2021) 1 each 12   guaiFENesin-codeine 100-10 MG/5ML syrup Take 5 mLs by mouth every 6 (six) hours as needed for cough. 120 mL 0   No facility-administered medications prior to visit.   Review of Systems  Constitutional:  Negative for chills, fever, malaise/fatigue and weight loss.  HENT:  Negative for congestion, sinus pain  and sore throat.   Eyes: Negative.   Respiratory:  Negative for cough, hemoptysis, sputum production, shortness of breath and wheezing.   Cardiovascular:  Negative for chest pain, palpitations, orthopnea, claudication and leg swelling.  Gastrointestinal:  Negative for abdominal pain, heartburn, nausea and vomiting.  Genitourinary: Negative.   Musculoskeletal:  Negative for joint pain and myalgias.  Skin:  Negative for rash.  Neurological:  Negative for weakness.  Endo/Heme/Allergies:  Positive for environmental allergies.  Psychiatric/Behavioral: Negative.      Objective:   Vitals:   01/16/22 0953  BP: 116/78  Pulse: 74  Temp: 97.8 F (36.6 C)  TempSrc: Oral  SpO2: 97%  Weight: 182 lb (82.6 kg)  Height: '5\' 5"'$  (1.651 m)    Physical Exam Constitutional:      General: She is not in acute distress.    Appearance: She is not ill-appearing.  HENT:     Head: Normocephalic and atraumatic.  Eyes:     General: No scleral icterus.    Conjunctiva/sclera: Conjunctivae normal.  Cardiovascular:     Rate and Rhythm: Normal rate and regular rhythm.     Pulses: Normal pulses.     Heart sounds: Normal heart sounds. No murmur heard. Pulmonary:     Effort: Pulmonary effort is normal.     Breath sounds: Normal breath sounds. No wheezing, rhonchi or rales.  Musculoskeletal:     Right lower leg: No edema.     Left lower leg: No edema.  Skin:    General: Skin is warm and dry.  Neurological:     General: No focal deficit present.     Mental Status: She is alert.  Psychiatric:         Mood and Affect: Mood normal.        Behavior: Behavior normal.        Thought Content: Thought content normal.        Judgment: Judgment normal.    CBC    Component Value Date/Time   WBC 7.2 09/19/2021 1229   WBC 6.7 09/06/2021 0838   RBC 4.20 09/19/2021 1229   HGB 13.2 09/19/2021 1229   HCT 38.2 09/19/2021 1229   PLT 284 09/19/2021 1229   MCV 91.0 09/19/2021 1229   MCH 31.4 09/19/2021 1229   MCHC 34.6 09/19/2021 1229   RDW 12.9 09/19/2021 1229   LYMPHSABS 1.4 09/19/2021 1229   MONOABS 0.4 09/19/2021 1229   EOSABS 0.1 09/19/2021 1229   BASOSABS 0.0 09/19/2021 1229      Latest Ref Rng & Units 09/19/2021   12:29 PM 09/06/2021    8:38 AM 05/09/2021    9:27 AM  BMP  Glucose 70 - 99 mg/dL 102  93  102   BUN 8 - 23 mg/dL '13  15  18   '$ Creatinine 0.44 - 1.00 mg/dL 0.83  0.80  0.94   Sodium 135 - 145 mmol/L 139  140  142   Potassium 3.5 - 5.1 mmol/L 4.0  4.3  3.8   Chloride 98 - 111 mmol/L 108  103  109   CO2 22 - 32 mmol/L '24  29  20   '$ Calcium 8.9 - 10.3 mg/dL 9.5  9.7  9.7    Chest imaging: CT Chest 04/19/21 1. The density of concern on chest radiography corresponds to some sclerotic spurring of the left anterior first rib costochondral junction. There is no true underlying pulmonary nodule in this vicinity. 2. Hazy interstitial accentuation anteriorly in the right lung is likely  primarily related to prior radiation therapy given the other findings of prior breast surgery and right axillary dissection. 3. Minimal airway plugging in both lower lobes. 4.  Aortic Atherosclerosis  PFT:    Latest Ref Rng & Units 01/16/2022    8:29 AM  PFT Results  FVC-Pre L 2.60  P  FVC-Predicted Pre % 80  P  FVC-Post L 2.57  P  FVC-Predicted Post % 79  P  Pre FEV1/FVC % % 82  P  Post FEV1/FCV % % 84  P  FEV1-Pre L 2.12  P  FEV1-Predicted Pre % 86  P  FEV1-Post L 2.15  P  DLCO uncorrected ml/min/mmHg 21.53  P  DLCO UNC% % 105  P  DLCO corrected ml/min/mmHg 21.53  P  DLCO COR  %Predicted % 105  P  DLVA Predicted % 123  P  TLC L 4.36  P  TLC % Predicted % 83  P  RV % Predicted % 68  P    P Preliminary result    Labs:  Path:  Echo:  Heart Catheterization:  Assessment & Plan:   Mild intermittent reactive airway disease without complication  Discussion: Katherine Hanna is a 66 year old woman, never smoker with history of breast cancer, and allergic rhinitis who returns to pulmonary clinic for reactive airways disease.  She has reactive airways disease in setting of upper repsiratory viral infections. She is to use advair HFA 115-29mg 2 puffs twice daily with spacer as needed when she has viral illnesses leading to cough, wheezing and dyspnea. PFTs are within normal limits today.  Follow up as needed in the future.  JFreda Jackson MD [facility] Office: [phone]   Current Outpatient Medications:    albuterol (VENTOLIN HFA) 108 (90 Base) MCG/ACT inhaler, Inhale 2 puffs into the lungs every 6 (six) hours as needed for wheezing or shortness of breath., Disp: 8 g, Rfl: 0   anastrozole (ARIMIDEX) 1 MG tablet, Take 1 tablet (1 mg total) by mouth daily., Disp: 90 tablet, Rfl: 3   Calcium Carbonate-Vitamin D (CALCIUM 500 + D PO), Take 1 tablet by mouth 2 (two) times daily., Disp: , Rfl:    cetirizine (ZYRTEC) 10 MG tablet, Take 10 mg by mouth at bedtime., Disp: , Rfl:    co-enzyme Q-10 30 MG capsule, Take 30 mg by mouth daily., Disp: , Rfl:    Misc Natural Products (TART CHERRY ADVANCED PO), Take by mouth., Disp: , Rfl:    Multiple Vitamin (MULTIVITAMIN) tablet, Take 1 tablet by mouth daily., Disp: , Rfl:    TURMERIC PO, Take by mouth., Disp: , Rfl:

## 2022-01-16 NOTE — Patient Instructions (Signed)
Full PFT performed today. °

## 2022-01-16 NOTE — Progress Notes (Signed)
Full PFT performed today. °

## 2022-01-16 NOTE — Patient Instructions (Addendum)
Your breathing tests are within normal limits  You can use advair 115-68mg 2 puffs twice daily as needed when you symptoms return with viral infections  Follow up as needed

## 2022-01-24 DIAGNOSIS — Z170 Estrogen receptor positive status [ER+]: Secondary | ICD-10-CM | POA: Diagnosis not present

## 2022-01-24 DIAGNOSIS — C50211 Malignant neoplasm of upper-inner quadrant of right female breast: Secondary | ICD-10-CM | POA: Diagnosis not present

## 2022-02-06 ENCOUNTER — Telehealth: Payer: Self-pay | Admitting: Pulmonary Disease

## 2022-02-06 NOTE — Telephone Encounter (Signed)
Called patient and she states she would like something called in. She states she has had an aggravating cough for about 2-3 weeks now. Cough is nonproductive. No fever noted. No covid.   Please advise sir

## 2022-02-06 NOTE — Telephone Encounter (Signed)
I saw patient 8/28 and no complaints at that time.   If using the advair inhaler scheduled 2 puffs twice daily with the spacer is not helping her cough then she needs to be scheduled with an APP for further evaluation of the cough.   Thanks, JD

## 2022-02-07 ENCOUNTER — Ambulatory Visit (HOSPITAL_BASED_OUTPATIENT_CLINIC_OR_DEPARTMENT_OTHER)
Admission: RE | Admit: 2022-02-07 | Discharge: 2022-02-07 | Disposition: A | Discharge status: Home / Self Care | Payer: Medicare HMO | Source: Ambulatory Visit | Attending: Medical | Admitting: Medical

## 2022-02-07 ENCOUNTER — Ambulatory Visit (INDEPENDENT_AMBULATORY_CARE_PROVIDER_SITE_OTHER): Payer: Medicare HMO | Admitting: Medical

## 2022-02-07 VITALS — BP 140/80 | HR 98 | Temp 97.8°F | Resp 20 | Ht 65.0 in | Wt 180.8 lb

## 2022-02-07 DIAGNOSIS — R059 Cough, unspecified: Secondary | ICD-10-CM

## 2022-02-07 DIAGNOSIS — R062 Wheezing: Secondary | ICD-10-CM | POA: Diagnosis not present

## 2022-02-07 DIAGNOSIS — J45909 Unspecified asthma, uncomplicated: Secondary | ICD-10-CM | POA: Diagnosis not present

## 2022-02-07 MED ORDER — HYDROCODONE BIT-HOMATROP MBR 5-1.5 MG/5ML PO SOLN: 5.0000 mL | Freq: Three times a day (TID) | ORAL | 0 refills | Status: DC | PRN

## 2022-02-07 MED ORDER — DOXYCYCLINE HYCLATE 100 MG PO TABS: 100.0000 mg | ORAL_TABLET | Freq: Two times a day (BID) | ORAL | 0 refills | Status: DC

## 2022-02-07 MED ORDER — PREDNISONE 10 MG (21) PO TBPK: ORAL_TABLET | ORAL | 0 refills | Status: DC

## 2022-02-07 NOTE — Progress Notes (Signed)
Subjective:    Patient ID: Katherine Hanna, female    DOB: 1956-01-08, 66 y.o.   MRN: [id number]  HPI Pt has been coughing for about 2.5 weeks. She has hx of bronchitis with some wheezing. States wheezes during coughing fit.  Pt states cough is dry. She can feel some pnd.   She does get allergies this time of year. Earlier of this week was sneezing.    Pt recently restarted her advair 2 puffs twice a day but symptoms getting progressively worse. Not sleeping well due to cough.  Last fall around this time took about one month or so to get better.       Review of Systems  Constitutional:  Negative for chills, fatigue and fever.  HENT:  Positive for congestion.   Respiratory:  Positive for cough and wheezing. Negative for chest tightness.   Cardiovascular:  Negative for chest pain and palpitations.  Gastrointestinal:  Negative for abdominal pain and anal bleeding.  Genitourinary:  Negative for dysuria.  Musculoskeletal:  Negative for arthralgias and back pain.  Skin:  Negative for rash.  Neurological:  Negative for dizziness, light-headedness and headaches.  Hematological:  Negative for adenopathy. Does not bruise/bleed easily.  Psychiatric/Behavioral:  Negative for behavioral problems and confusion.     Past Medical History:  Diagnosis Date   Allergic rhinitis    Arthritis    knee   Asthma    Breast cancer ([facility])    History of chicken pox    Migraine    Rheumatic fever    as a child.     Social History   Socioeconomic History   Marital status: Married    Spouse name: Not on file   Number of children: 2   Years of education: Not on file   Highest education level: Not on file  Occupational History   Occupation: Engineer, maintenance (IT)  Tobacco Use   Smoking status: Never   Smokeless tobacco: Never  Vaping Use   Vaping Use: Never used  Substance and Sexual Activity   Alcohol use: No   Drug use: No   Sexual activity: Yes    Partners: Male  Other Topics Concern   Not on file   Social History Narrative   Exercise---   no    Social Determinants of Health   Financial Resource Strain: Low Risk  (08/11/2021)   Overall Financial Resource Strain (CARDIA)    Difficulty of Paying Living Expenses: Not hard at all  Food Insecurity: No Food Insecurity (08/11/2021)   Hunger Vital Sign    Worried About Running Out of Food in the Last Year: Never true    [place] in the Last Year: Never true  Transportation Needs: No Transportation Needs (08/11/2021)   PRAPARE - Hydrologist (Medical): No    Lack of Transportation (Non-Medical): No  Physical Activity: Not on file  Stress: No Stress Concern Present (08/11/2021)   [place]    Feeling of Stress : Not at all  Social Connections: Moderately Integrated (08/11/2021)   Social Connection and Isolation Panel [NHANES]    Frequency of Communication with Friends and Family: More than three times a week    Frequency of Social Gatherings with Friends and Family: Once a week    Attends Religious Services: More than 4 times per year    Active Member of [employer] or Organizations: No    Attends Archivist Meetings:  Never    Marital Status: Married  Human resources officer Violence: Not At Risk (08/11/2021)   Humiliation, Afraid, Rape, and Kick questionnaire    Fear of Current or Ex-Partner: No    Emotionally Abused: No    Physically Abused: No    Sexually Abused: No    Past Surgical History:  Procedure Laterality Date   APPENDECTOMY  11/1971   BREAST LUMPECTOMY WITH RADIOACTIVE SEED AND SENTINEL LYMPH NODE BIOPSY Right 01/20/2020   Procedure: RIGHT BREAST LUMPECTOMY WITH RADIOACTIVE SEED AND SENTINEL LYMPH NODE BIOPSY;  Surgeon: Stark Klein, MD;  Location: [place];  Service: General;  Laterality: Right;   COLONOSCOPY     TONSILLECTOMY     when she was 2 or 3    Family History  Problem Relation Age of Onset    Lung cancer Mother        smoked   Diabetes Father    Stroke Father    Cancer Father        bladder   Leukemia Father    Stroke Maternal Grandmother        40s   Coronary artery disease Maternal Grandfather    Stroke Paternal Grandmother    Colon cancer Maternal Uncle    Breast cancer Paternal Aunt    Esophageal cancer Neg Hx     Allergies  Allergen Reactions   Penicillins Rash    Rash head to toe    Current Outpatient Medications on File Prior to Visit  Medication Sig Dispense Refill   albuterol (VENTOLIN HFA) 108 (90 Base) MCG/ACT inhaler Inhale 2 puffs into the lungs every 6 (six) hours as needed for wheezing or shortness of breath. 8 g 0   anastrozole (ARIMIDEX) 1 MG tablet Take 1 tablet (1 mg total) by mouth daily. 90 tablet 3   Calcium Carbonate-Vitamin D (CALCIUM 500 + D PO) Take 1 tablet by mouth 2 (two) times daily.     cetirizine (ZYRTEC) 10 MG tablet Take 10 mg by mouth at bedtime.     co-enzyme Q-10 30 MG capsule Take 30 mg by mouth daily.     Misc Natural Products (TART CHERRY ADVANCED PO) Take by mouth.     Multiple Vitamin (MULTIVITAMIN) tablet Take 1 tablet by mouth daily.     TURMERIC PO Take by mouth.     No current facility-administered medications on file prior to visit.    BP (!) 140/80   Pulse 98   Temp 97.8 F (36.6 C)   Resp 20   Ht '5\' 5"'$  (1.651 m)   Wt 180 lb 12.8 oz (82 kg)   SpO2 99%   BMI 30.09 kg/m        Objective:   Physical Exam  General- No acute distress. Pleasant patient. Neck- Full range of motion, no jvd Lungs- Clear, even and unlabored. Heart- regular rate and rhythm. Neurologic- CNII- XII grossly intact.    Heent- no sinus pressure to palpation. Boggy turbinates. +pnd.         Assessment & Plan:   Patient Instructions  Bronchitis type symptoms with severe cough and some wheezing. Rt lower lung field has somes accessory breath sounds. Will get cxr.  Based on your history and duration of illness decide to  prescribe doxycycline antibiotic, 6 day taper prednisone and hycodan cough syrup.  Can continue advair inhaler.  Follow up in 10-14 days or sooner if needed.

## 2022-02-07 NOTE — Patient Instructions (Addendum)
Bronchitis type symptoms with severe cough and some wheezing. Rt lower lung field has somes accessory breath sounds. Will get cxr.  Based on your history and duration of illness decide to prescribe doxycycline antibiotic, 6 day taper prednisone and hycodan cough syrup.  Can continue advair inhaler.  Follow up in 10-14 days or sooner if needed.

## 2022-02-08 NOTE — Telephone Encounter (Signed)
Noted. Chest radiograph was clear.

## 2022-02-08 NOTE — Telephone Encounter (Signed)
Went to PCP yesterday and was placed on doxy for 10 days and taper pack of prednisone. Pt states that she had chest xray done at pcp yesterday. She stated medication yesterday.   FYI Dr Erin Fulling

## 2022-02-13 ENCOUNTER — Ambulatory Visit: Payer: Medicare HMO | Attending: General Surgery

## 2022-02-13 VITALS — Wt 183.1 lb

## 2022-02-13 DIAGNOSIS — Z483 Aftercare following surgery for neoplasm: Secondary | ICD-10-CM | POA: Insufficient documentation

## 2022-02-13 NOTE — Therapy (Signed)
OUTPATIENT PHYSICAL THERAPY SOZO SCREENING NOTE   Patient Name: Katherine Hanna MRN: [id number] DOB:10/01/1955, 66 y.o., female Today's Date: 02/13/2022  PCP: Lowne Chase, Yvonne R, DO REFERRING PROVIDER: Byerly, Faera, MD   PT End of Session - 02/13/22 0832     Visit Number 2   # unchanged due to screen only   PT Start Time 0829    PT Stop Time 0834    PT Time Calculation (min) 5 min    Activity Tolerance Patient tolerated treatment well    Behavior During Therapy WFL for tasks assessed/performed             Past Medical History:  Diagnosis Date   Allergic rhinitis    Arthritis    knee   Asthma    Breast cancer ([facility])    History of chicken pox    Migraine    Rheumatic fever    as a child.   Past Surgical History:  Procedure Laterality Date   APPENDECTOMY  11/1971   BREAST LUMPECTOMY WITH RADIOACTIVE SEED AND SENTINEL LYMPH NODE BIOPSY Right 01/20/2020   Procedure: RIGHT BREAST LUMPECTOMY WITH RADIOACTIVE SEED AND SENTINEL LYMPH NODE BIOPSY;  Surgeon: Byerly, Faera, MD;  Location: [facility];  Service: General;  Laterality: Right;   COLONOSCOPY     TONSILLECTOMY     when she was 2 or 3   Patient Active Problem List   Diagnosis Date Noted   Bronchitis 04/18/2021   Malignant neoplasm of upper-inner quadrant of right breast in female, estrogen receptor positive (HCC) 12/08/2019   Seasonal allergies 07/30/2017   Need for diphtheria-tetanus-pertussis (Tdap) vaccine 07/30/2017   Vitamin D deficiency 06/28/2016   Osteoporosis 06/18/2015   Asthma with bronchitis 06/26/2014   Food allergy 02/28/2012   Depression 10/11/2010   Preventative health care 10/05/2010   Screening for malignant neoplasm of the cervix 10/05/2010   COMMON MIGRAINE 08/31/2009   ALLERGIC RHINITIS 08/31/2009   CERVICAL POLYP 08/31/2009   ARTHRITIS 08/31/2009   POSTMENOPAUSAL STATUS 08/31/2009    REFERRING DIAG: right breast cancer at risk for lymphedema  THERAPY DIAG: Aftercare  following surgery for neoplasm  PERTINENT HISTORY: Patient was diagnosed on 11/10/2019 with right grade II invasive ductal carcinoma breast cancer. Patient underwent a right lumpectomy and sentinel node biopsy on 01/20/2020 with 1/4 positive axillary lymph nodes. It is ER/PR positive and HER2 negative with a Ki67 of 10%.   PRECAUTIONS: right UE Lymphedema risk, None  SUBJECTIVE: Pt returns for her 3 month L-Dex   PAIN:  Are you having pain? No  SOZO SCREENING: Patient was assessed today using the SOZO machine to determine the lymphedema index score. This was compared to her baseline score. It was determined that she is within the recommended range when compared to her baseline and no further action is needed at this time. She will continue SOZO screenings. These are done every 3 months for 2 years post operatively followed by every 6 months for 2 years, and then annually.   L-DEX FLOWSHEETS - 02/13/22 0800       L-DEX LYMPHEDEMA SCREENING   Measurement Type Unilateral    L-DEX MEASUREMENT EXTREMITY Upper Extremity    POSITION  Standing    DOMINANT SIDE Right    At Risk Side Right    BASELINE SCORE (UNILATERAL) 4.3    L-DEX SCORE (UNILATERAL) 4.8    VALUE CHANGE (UNILAT) 0.5              Rosenberger, Valerie Ann,   PTA 02/13/2022, 8:33 AM    

## 2022-02-17 ENCOUNTER — Telehealth: Payer: Self-pay | Admitting: Pulmonary Disease

## 2022-02-17 NOTE — Telephone Encounter (Signed)
Called and spoke with patient. Patient verbalized understanding. Patient scheduled an appointment with Marland Kitchen on October 3rd.   Nothing further needed.

## 2022-02-17 NOTE — Telephone Encounter (Signed)
Recommend appointment with APP for further evaluation of her cough.   - She can try fluticasone nasal spray OTC if she is having sinus congestion and drainage - She can try famotidine '20mg'$  daily OTC if she is having issues with reflux.   Otherwise difficult to make further recommendations without further assessing in person.  Thanks, JD

## 2022-02-17 NOTE — Telephone Encounter (Signed)
Spoke with pt who is still have cough but now is productive. Pt states cough remains in spite of completing Doxycyline and prednisone taper and using Mucinex. Pt is using Hycodan as well at night. Dr. Erin Fulling please advise.

## 2022-02-21 ENCOUNTER — Encounter: Payer: Self-pay | Admitting: Nurse Practitioner

## 2022-02-21 ENCOUNTER — Ambulatory Visit (INDEPENDENT_AMBULATORY_CARE_PROVIDER_SITE_OTHER): Payer: Medicare HMO

## 2022-02-21 ENCOUNTER — Ambulatory Visit: Payer: Medicare HMO | Admitting: Nurse Practitioner

## 2022-02-21 VITALS — BP 132/74 | HR 82 | Temp 98.2°F | Ht 65.0 in | Wt 185.2 lb

## 2022-02-21 DIAGNOSIS — J4521 Mild intermittent asthma with (acute) exacerbation: Secondary | ICD-10-CM | POA: Diagnosis not present

## 2022-02-21 DIAGNOSIS — J4520 Mild intermittent asthma, uncomplicated: Secondary | ICD-10-CM

## 2022-02-21 DIAGNOSIS — J45909 Unspecified asthma, uncomplicated: Secondary | ICD-10-CM | POA: Diagnosis not present

## 2022-02-21 DIAGNOSIS — R059 Cough, unspecified: Secondary | ICD-10-CM | POA: Diagnosis not present

## 2022-02-21 LAB — POCT EXHALED NITRIC OXIDE: FeNO level (ppb): 30

## 2022-02-21 MED ORDER — PREDNISONE 10 MG PO TABS: ORAL_TABLET | ORAL | 0 refills | Status: DC

## 2022-02-21 MED ORDER — BENZONATATE 200 MG PO CAPS: 200.0000 mg | ORAL_CAPSULE | Freq: Three times a day (TID) | ORAL | 1 refills | Status: DC | PRN

## 2022-02-21 MED ORDER — METHYLPREDNISOLONE ACETATE 80 MG/ML IJ SUSP
80.0000 mg | Freq: Once | INTRAMUSCULAR | Status: AC
  Administered 2022-02-21: 80 mg via INTRAMUSCULAR

## 2022-02-21 NOTE — Patient Instructions (Addendum)
Continue Advair 2 puffs Twice daily. Brush tongue and rinse mouth afterwards Continue Albuterol inhaler 2 puffs every 6 hours as needed for shortness of breath or wheezing. Notify if symptoms persist despite rescue inhaler/neb use. Continue Zyrtec 1 tab daily  Continue hycodan cough syrup 5 mL every 8 hours as needed for cough  Delsym 2 tsp in the morning Tessalon perles (benzonatate) 1 capsule Three times a day for cough Prednisone taper. 4 tabs for 3 days, then 3 tabs for 3 days, 2 tabs for 3 days, then 1 tab for 3 days, then stop. Take in AM with food.   Suppress your cough to allow your larynx (voice box) to heal.  Limit talking for the next few days. Avoid throat clearing. Work on cough suppression with the above recommended suppressants.  Use sugar free hard candies or non-menthol cough drops during this time to soothe your throat.  Warm tea with honey and lemon.   Follow up in 2 weeks with Dr. Erin Fulling or Alanson Aly. If symptoms do not improve or worsen, please contact office for sooner follow up or seek emergency care.

## 2022-02-21 NOTE — Progress Notes (Signed)
$'@Patient'L$  ID: Katherine Hanna, female    DOB: [birth year], 66 y.o.   MRN: [id number]  Chief Complaint  Patient presents with   Acute Visit    Pt is here for acute visit for her cough. Cough started on 9/1. 2 weeks ag went to PCP and started prednisone and doxy. Finshed it Thursday. Pt states that even after finishing the medication her cough is still bad. Cough is productive with yellow mucus. At home covid test was last Thursday and it was negative. Pt is on Advair inhaler and albuterol as needed Pt states she does not see much difference with inhaler. OTC mucinex did not help.     Referring provider: Ann Held, *  HPI: 66 year old female, never smoker followed for mild reactive airway disease. She is a patient of Dr. August Albino and last seen in office on 01/16/2022. Past medical history significant for migraines, allergic rhinitis, depression, stage IA breast cancer.   TEST/EVENTS:  04/19/2021 CT chest wo contrast: atherosclerosis. Interstitial accentuation anteriorly in the right lung which extends up to the level of the lung apex and likely contributes to prior radiation. Mild airway plugging.  01/16/2022 PFT: FVC 80, FEV1 86, ratio 84, TLC 83, DLCOcor 105  01/16/2022: OV with Dr. Erin Fulling. Reactive airway disease r/t upper respiratory infections. PFTs nl. She had good response to Advair previously. Plans to continue it PRN with URI's and during allergy season.   02/21/2022: Today - acute Patient presents today for acute visit. Her symptoms initially started the beginning of September. She saw her PCP on 9/19 and was treated for bronchitis with doxycycline and prednisone taper. CXR at the time was clear. COVID was negative. Unfortunately, her symptoms never resolved. Today, she reports feeling even worse. Feels like her cough is stronger and more violent than it was before. It is productive now with yellow sputum. She gets short winded with coughing spells but otherwise, breathing is  stable. She has chest congestion. She denies wheezing, fevers, chills, hemoptysis, leg swelling, orthopnea. No significant URI symptoms; these resolved a few weeks ago. She denies any symptoms of reflux. She did feel better when she was on the doxycycline and prednisone previously. She is using her Advair twice daily. Not requiring her rescue inhaler. She uses hycodan cough syrup at bedtime but avoids using it otherwise because it causes her to be drowsy.   FeNO 30 ppb  Allergies  Allergen Reactions   Penicillins Rash    Rash head to toe    Immunization History  Administered Date(s) Administered   Fluad Quad(high Dose 65+) 03/28/2019, 02/03/2022   Hep A / Hep B 03/15/2010, 07/25/2010   Influenza Whole 02/19/2013   Influenza, High Dose Seasonal PF 03/05/2020   Influenza,inj,Quad PF,6+ Mos 03/13/2014   Influenza-Unspecified 03/23/2015, 03/28/2017, 03/14/2018   Moderna Covid-19 Vaccine Bivalent Booster 79yr & up 03/09/2021   Moderna Sars-Covid-2 Vaccination 07/04/2019, 07/28/2019, 03/19/2020, 12/10/2020   PNEUMOCOCCAL CONJUGATE-20 09/06/2021   Pneumococcal Conjugate-13 11/21/2019   Pneumococcal Polysaccharide-23 06/18/2015   Tdap 09/02/2006, 07/30/2017   Zoster Recombinat (Shingrix) 08/02/2018, 12/09/2018   Zoster, Live 04/28/2014    Past Medical History:  Diagnosis Date   Allergic rhinitis    Arthritis    knee   Asthma    Breast cancer ([place]    History of chicken pox    Migraine    Rheumatic fever    as a child.    Tobacco History: Social History   Tobacco Use  Smoking Status Never  Smokeless Tobacco Never   Counseling given: Not Answered   Outpatient Medications Prior to Visit  Medication Sig Dispense Refill   ADVAIR HFA 115-21 MCG/ACT inhaler Inhale 2 puffs into the lungs 2 (two) times daily.     albuterol (VENTOLIN HFA) 108 (90 Base) MCG/ACT inhaler Inhale 2 puffs into the lungs every 6 (six) hours as needed for wheezing or shortness of breath. 8 g 0    anastrozole (ARIMIDEX) 1 MG tablet Take 1 tablet (1 mg total) by mouth daily. 90 tablet 3   Calcium Carbonate-Vitamin D (CALCIUM 500 + D PO) Take 1 tablet by mouth 2 (two) times daily.     cetirizine (ZYRTEC) 10 MG tablet Take 10 mg by mouth at bedtime.     co-enzyme Q-10 30 MG capsule Take 30 mg by mouth daily.     HYDROcodone bit-homatropine (HYCODAN) 5-1.5 MG/5ML syrup Take 5 mLs by mouth every 8 (eight) hours as needed for cough. 120 mL 0   Misc Natural Products (TART CHERRY ADVANCED PO) Take by mouth.     Multiple Vitamin (MULTIVITAMIN) tablet Take 1 tablet by mouth daily.     TURMERIC PO Take by mouth.     doxycycline (VIBRA-TABS) 100 MG tablet Take 1 tablet (100 mg total) by mouth 2 (two) times daily. 20 tablet 0   predniSONE (STERAPRED UNI-PAK 21 TAB) 10 MG (21) TBPK tablet Standard Taper over 6 days. 21 tablet 0   No facility-administered medications prior to visit.     Review of Systems:   Constitutional: No weight loss or gain, night sweats, fevers, chills, fatigue, or lassitude. HEENT: No headaches, difficulty swallowing, tooth/dental problems, or sore throat. No sneezing, itching, ear ache, nasal congestion, or post nasal drip CV:  No chest pain, orthopnea, PND, swelling in lower extremities, anasarca, dizziness, palpitations, syncope Resp: +shortness of breath with coughing spells; productive cough; chest congestion/tightness. No hemoptysis. No wheezing.  No chest wall deformity GI:  No heartburn, indigestion MSK:  No joint pain or swelling.  No decreased range of motion.  No back pain. Neuro: No dizziness or lightheadedness.  Psych: No depression or anxiety. Mood stable.     Physical Exam:  BP 132/74 (BP Location: Left Arm, Patient Position: Sitting, Cuff Size: Normal)   Pulse 82   Temp 98.2 F (36.8 C) (Oral)   Ht '5\' 5"'$  (1.651 m)   Wt 185 lb 3.2 oz (84 kg)   SpO2 98%   BMI 30.82 kg/m   GEN: Pleasant, interactive, well-appearing; obese; in no acute  distress. HEENT:  Normocephalic and atraumatic. PERRLA. Sclera white. Nasal turbinates pale, moist and patent bilaterally. No rhinorrhea present. Oropharynx pink and moist, without exudate or edema. No lesions, ulcerations NECK:  Supple w/ fair ROM. No JVD present.  No lymphadenopathy.   CV: RRR, no m/r/g, no peripheral edema. Pulses intact, +2 bilaterally. No cyanosis, pallor or clubbing. PULMONARY:  Unlabored, regular breathing. Mild end expiratory wheeze right lung otherwise clear. No accessory muscle use.  GI: BS present and normoactive. Soft, non-tender to palpation. No organomegaly or masses detected.  MSK: No erythema, warmth or tenderness. No deformities or joint swelling noted.  Neuro: A/Ox3. No focal deficits noted.   Skin: Warm, no lesions or rashe Psych: Normal affect and behavior. Judgement and thought content appropriate.     Lab Results:  CBC    Component Value Date/Time   WBC 7.2 09/19/2021 1229   WBC 6.7 09/06/2021 0838   RBC 4.20 09/19/2021 1229   HGB 13.2  09/19/2021 1229   HCT 38.2 09/19/2021 1229   PLT 284 09/19/2021 1229   MCV 91.0 09/19/2021 1229   MCH 31.4 09/19/2021 1229   MCHC 34.6 09/19/2021 1229   RDW 12.9 09/19/2021 1229   LYMPHSABS 1.4 09/19/2021 1229   MONOABS 0.4 09/19/2021 1229   EOSABS 0.1 09/19/2021 1229   BASOSABS 0.0 09/19/2021 1229    BMET    Component Value Date/Time   NA 139 09/19/2021 1229   K 4.0 09/19/2021 1229   CL 108 09/19/2021 1229   CO2 24 09/19/2021 1229   GLUCOSE 102 (H) 09/19/2021 1229   BUN 13 09/19/2021 1229   CREATININE 0.83 09/19/2021 1229   CALCIUM 9.5 09/19/2021 1229   GFRNONAA >60 09/19/2021 1229   GFRAA >60 12/10/2019 1225    BNP No results found for: "BNP"   Imaging:  DG Chest 2 View  Result Date: 02/21/2022 CLINICAL DATA:  Provided history: Worsening productive cough. EXAM: CHEST - 2 VIEW COMPARISON:  Chest radiograph 02/07/2022 and earlier. FINDINGS: Heart size within normal limits. Chronic, mild  elevation of the right hemidiaphragm. No appreciable airspace consolidation. No evidence of pleural effusion or pneumothorax. No acute bony abnormality identified. Surgical clips within the right upper quadrant of the abdomen. IMPRESSION: No evidence of acute cardiopulmonary abnormality. Electronically Signed   By: Kellie Simmering D.O.   On: 02/21/2022 10:16   DG Chest 2 View  Result Date: 02/07/2022 CLINICAL DATA:  severe cough and wheezing. rt lower lung rough breath sounds. EXAM: CHEST - 2 VIEW COMPARISON:  Radiograph 04/18/2021 FINDINGS: Cardiomediastinal silhouette is within normal limits. There is no focal airspace consolidation. There is no pleural effusion. No pneumothorax. There is no acute osseous abnormality. Thoracic spondylosis. Surgical clips overlie the right axilla and right breast. IMPRESSION: No evidence of acute cardiopulmonary disease. Electronically Signed   By: Maurine Simmering M.D.   On: 02/07/2022 14:28    methylPREDNISolone acetate (DEPO-MEDROL) injection 80 mg     Date Action Dose Route User   02/21/2022 1028 Given 80 mg Intramuscular (Right Ventrogluteal) Monna Fam L, RN          Latest Ref Rng & Units 01/16/2022    8:29 AM  PFT Results  FVC-Pre L 2.60   FVC-Predicted Pre % 80   FVC-Post L 2.57   FVC-Predicted Post % 79   Pre FEV1/FVC % % 82   Post FEV1/FCV % % 84   FEV1-Pre L 2.12   FEV1-Predicted Pre % 86   FEV1-Post L 2.15   DLCO uncorrected ml/min/mmHg 21.53   DLCO UNC% % 105   DLCO corrected ml/min/mmHg 21.53   DLCO COR %Predicted % 105   DLVA Predicted % 123   TLC L 4.36   TLC % Predicted % 83   RV % Predicted % 68     No results found for: "NITRICOXIDE"      Assessment & Plan:   Asthma with bronchitis Unresolved acute exacerbation with elevated exhaled nitric oxide, likely brought on by URI. She had some improvement with previous doxycycline and prednisone courses but has worsened since completing these. CXR today without evidence of  superimposed infection; hold off on further abx. We will treat her with depo inj 80 mg x 1 and prednisone taper. Target cough control measures. She doesn't appear to have any significant URI or GERD symptoms contributing to her cough. Continue ICS/LABA therapy and prn albuterol.  Patient Instructions  Continue Advair 2 puffs Twice daily. Brush tongue and rinse mouth afterwards  Continue Albuterol inhaler 2 puffs every 6 hours as needed for shortness of breath or wheezing. Notify if symptoms persist despite rescue inhaler/neb use. Continue Zyrtec 1 tab daily  Continue hycodan cough syrup 5 mL every 8 hours as needed for cough  Delsym 2 tsp in the morning Tessalon perles (benzonatate) 1 capsule Three times a day for cough Prednisone taper. 4 tabs for 3 days, then 3 tabs for 3 days, 2 tabs for 3 days, then 1 tab for 3 days, then stop. Take in AM with food.   Suppress your cough to allow your larynx (voice box) to heal.  Limit talking for the next few days. Avoid throat clearing. Work on cough suppression with the above recommended suppressants.  Use sugar free hard candies or non-menthol cough drops during this time to soothe your throat.  Warm tea with honey and lemon.   Follow up in 2 weeks with Dr. Erin Fulling or Alanson Aly. If symptoms do not improve or worsen, please contact office for sooner follow up or seek emergency care.     I spent 35 minutes of dedicated to the care of this patient on the date of this encounter to include pre-visit review of records, face-to-face time with the patient discussing conditions above, post visit ordering of testing, clinical documentation with the electronic health record, making appropriate referrals as documented, and communicating necessary findings to members of the patients care team.  Clayton Bibles, NP 02/21/2022  Pt aware and understands NP's role.

## 2022-02-21 NOTE — Assessment & Plan Note (Addendum)
Unresolved acute exacerbation with elevated exhaled nitric oxide, likely brought on by URI. She had some improvement with previous doxycycline and prednisone courses but has worsened since completing these. CXR today without evidence of superimposed infection; hold off on further abx. We will treat her with depo inj 80 mg x 1 and prednisone taper. Target cough control measures. She doesn't appear to have any significant URI or GERD symptoms contributing to her cough. Continue ICS/LABA therapy and prn albuterol.  Patient Instructions  Continue Advair 2 puffs Twice daily. Brush tongue and rinse mouth afterwards Continue Albuterol inhaler 2 puffs every 6 hours as needed for shortness of breath or wheezing. Notify if symptoms persist despite rescue inhaler/neb use. Continue Zyrtec 1 tab daily  Continue hycodan cough syrup 5 mL every 8 hours as needed for cough  Delsym 2 tsp in the morning Tessalon perles (benzonatate) 1 capsule Three times a day for cough Prednisone taper. 4 tabs for 3 days, then 3 tabs for 3 days, 2 tabs for 3 days, then 1 tab for 3 days, then stop. Take in AM with food.   Suppress your cough to allow your larynx (voice box) to heal.  Limit talking for the next few days. Avoid throat clearing. Work on cough suppression with the above recommended suppressants.  Use sugar free hard candies or non-menthol cough drops during this time to soothe your throat.  Warm tea with honey and lemon.   Follow up in 2 weeks with Dr. Erin Fulling or Alanson Aly. If symptoms do not improve or worsen, please contact office for sooner follow up or seek emergency care.

## 2022-03-07 ENCOUNTER — Encounter: Payer: Self-pay | Admitting: Nurse Practitioner

## 2022-03-07 ENCOUNTER — Ambulatory Visit: Payer: Medicare HMO | Admitting: Nurse Practitioner

## 2022-03-07 VITALS — BP 140/80 | HR 69 | Temp 97.9°F | Ht 65.0 in | Wt 186.4 lb

## 2022-03-07 DIAGNOSIS — J3089 Other allergic rhinitis: Secondary | ICD-10-CM

## 2022-03-07 DIAGNOSIS — J45909 Unspecified asthma, uncomplicated: Secondary | ICD-10-CM

## 2022-03-07 DIAGNOSIS — J310 Chronic rhinitis: Secondary | ICD-10-CM | POA: Diagnosis not present

## 2022-03-07 LAB — POCT EXHALED NITRIC OXIDE: FeNO level (ppb): 39

## 2022-03-07 MED ORDER — FLUTICASONE-SALMETEROL 230-21 MCG/ACT IN AERO: 2.0000 | INHALATION_SPRAY | Freq: Two times a day (BID) | RESPIRATORY_TRACT | 5 refills | Status: DC

## 2022-03-07 MED ORDER — AZITHROMYCIN 250 MG PO TABS: ORAL_TABLET | ORAL | 0 refills | Status: DC

## 2022-03-07 MED ORDER — METHYLPREDNISOLONE ACETATE 80 MG/ML IJ SUSP
120.0000 mg | Freq: Once | INTRAMUSCULAR | Status: AC
  Administered 2022-03-07: 120 mg via INTRAMUSCULAR

## 2022-03-07 MED ORDER — FLUTICASONE PROPIONATE 50 MCG/ACT NA SUSP: 2.0000 | Freq: Every day | NASAL | 2 refills | Status: DC

## 2022-03-07 MED ORDER — PREDNISONE 10 MG PO TABS: ORAL_TABLET | ORAL | 0 refills | Status: DC

## 2022-03-07 MED ORDER — BENZONATATE 200 MG PO CAPS: 200.0000 mg | ORAL_CAPSULE | Freq: Three times a day (TID) | ORAL | 1 refills | Status: DC | PRN

## 2022-03-07 NOTE — Assessment & Plan Note (Signed)
Unresolved exacerbation of asthmatic bronchitis with elevated exhaled nitric oxide. CXR from 10/3 was clear. We will treat her with empiric azithromycin and restart her on prednisone. Depo 120 mg inj x 1 today. Step up to high dose Advair. Re-educated on the importance of controlling her cough and reducing airway irritation. Verbalized understanding. Close follow up.  Patient Instructions  Increase Advair 2 puffs Twice daily. Brush tongue and rinse mouth afterwards Continue Albuterol inhaler 2 puffs every 6 hours as needed for shortness of breath or wheezing. Notify if symptoms persist despite rescue inhaler/neb use. Continue Zyrtec 1 tab daily  Continue hycodan cough syrup 5 mL At bedtime; if other cough control measures are not helping, you can use the hycodan every 8 hours. May cause drowsiness. Do not drive after taking  Mucinex DM Twice daily until cough improves  Chlorpheniramine tab 4 mg over the counter every 6 hours for cough/drainage; may cause drowsiness Tessalon perles (benzonatate) 1 capsule Three times a day for cough Prednisone taper. 4 tabs for 3 days, then 3 tabs for 3 days, 2 tabs for 3 days, then 1 tab for 3 days, then stop. Take in AM with food. Start tomorrow Azithromycin (z pack) - take 2 tabs on day one then 1 tab daily for four additional days  Flonase nasal sprays 2 sprays each nostril daily until symptoms better   Suppress your cough to allow your larynx (voice box) to heal.  Limit talking for the next few days. Avoid throat clearing. Work on cough suppression with the above recommended suppressants.  Use sugar free hard candies or non-menthol cough drops during this time to soothe your throat.  Warm tea with honey and lemon.    Follow up in 1 week with Dr. Erin Fulling or Alanson Aly. If symptoms do not improve or worsen, please contact office for sooner follow up or seek emergency care.

## 2022-03-07 NOTE — Progress Notes (Signed)
$'@Patient'a$  ID: Katherine Hanna, female    DOB: September 12, 1955, 66 y.o.   MRN: [id number]  Chief Complaint  Patient presents with   Follow-up    Referring provider: Ann Held, *  HPI: 66 year old female, never smoker followed for mild reactive airway disease. She is a patient of Dr. August Albino and last seen in office on 03/07/2022 by [facility] NP. Past medical history significant for migraines, allergic rhinitis, depression, stage IA breast cancer.   TEST/EVENTS:  04/19/2021 CT chest wo contrast: atherosclerosis. Interstitial accentuation anteriorly in the right lung which extends up to the level of the lung apex and likely contributes to prior radiation. Mild airway plugging.  01/16/2022 PFT: FVC 80, FEV1 86, ratio 84, TLC 83, DLCOcor 105 02/21/2022: clear lungs  01/16/2022: OV with Dr. Erin Fulling. Reactive airway disease r/t upper respiratory infections. PFTs nl. She had good response to Advair previously. Plans to continue it PRN with URI's and during allergy season.   02/21/2022: OV with Lemon Whitacre NP for acute visit. Her symptoms initially started the beginning of September. She saw her PCP on 9/19 and was treated for bronchitis with doxycycline and prednisone taper. CXR at the time was clear. COVID was negative. Unfortunately, her symptoms never resolved. Unresolved acute exacerbation with elevated exhaled nitric oxide, likely brought on by URI. She had some improvement with previous doxycycline and prednisone courses but has worsened since completing these. CXR today without evidence of superimposed infection; hold off on further abx. We will treat her with depo inj 80 mg x 1 and prednisone taper. Target cough control measures. She doesn't appear to have any significant URI or GERD symptoms contributing to her cough. Continue ICS/LABA therapy and prn albuterol.  03/07/2022: Today - follow up Patient presents today for intended follow up; unfortunately, she is still experiencing persistent cough. She  was doing better after the depo injection and on higher doses of prednisone. Once she tapered down to 10 mg then off the steroids, her cough became more violent and paroxysmal. It's primarily non-productive now but sounds congested. She notices an occasional wheeze. She feels like she can tell when her advair starts to wear off because her cough flares up. No worse at night. She feels short of breath with coughing spells; otherwise breathing is at her baseline. She denies fevers, chills, night sweats, lassitude, anorexia, hemoptysis, leg swelling. No significant nasal drainage or GERD symptoms. Does occasionally feel like she has some post nasal drip. She is on Advair 115; uses it twice a day. Takes zyrtec for allergies. She ran out of the tessalon; did feel like they helped. She only takes the hycodan syrup when she can't sleep due to the cough.  FeNO 39 ppb  Allergies  Allergen Reactions   Penicillins Rash    Rash head to toe    Immunization History  Administered Date(s) Administered   Fluad Quad(high Dose 65+) 03/28/2019, 02/03/2022   Hep A / Hep B 03/15/2010, 07/25/2010   Influenza Whole 02/19/2013   Influenza, High Dose Seasonal PF 03/05/2020   Influenza,inj,Quad PF,6+ Mos 03/13/2014   Influenza-Unspecified 03/23/2015, 03/28/2017, 03/14/2018   Moderna Covid-19 Vaccine Bivalent Booster 57yr & up 03/09/2021   Moderna Sars-Covid-2 Vaccination 07/04/2019, 07/28/2019, 03/19/2020, 12/10/2020   PNEUMOCOCCAL CONJUGATE-20 09/06/2021   Pneumococcal Conjugate-13 11/21/2019   Pneumococcal Polysaccharide-23 06/18/2015   Tdap 09/02/2006, 07/30/2017   Zoster Recombinat (Shingrix) 08/02/2018, 12/09/2018   Zoster, Live 04/28/2014    Past Medical History:  Diagnosis Date   Allergic rhinitis  Arthritis    knee   Asthma    Breast cancer ([facility])    History of chicken pox    Migraine    Rheumatic fever    as a child.    Tobacco History: Social History   Tobacco Use  Smoking Status Never   Smokeless Tobacco Never   Counseling given: Not Answered   Outpatient Medications Prior to Visit  Medication Sig Dispense Refill   albuterol (VENTOLIN HFA) 108 (90 Base) MCG/ACT inhaler Inhale 2 puffs into the lungs every 6 (six) hours as needed for wheezing or shortness of breath. 8 g 0   anastrozole (ARIMIDEX) 1 MG tablet Take 1 tablet (1 mg total) by mouth daily. 90 tablet 3   Calcium Carbonate-Vitamin D (CALCIUM 500 + D PO) Take 1 tablet by mouth 2 (two) times daily.     cetirizine (ZYRTEC) 10 MG tablet Take 10 mg by mouth at bedtime.     co-enzyme Q-10 30 MG capsule Take 30 mg by mouth daily.     HYDROcodone bit-homatropine (HYCODAN) 5-1.5 MG/5ML syrup Take 5 mLs by mouth every 8 (eight) hours as needed for cough. 120 mL 0   Misc Natural Products (TART CHERRY ADVANCED PO) Take by mouth.     Multiple Vitamin (MULTIVITAMIN) tablet Take 1 tablet by mouth daily.     TURMERIC PO Take by mouth.     ADVAIR HFA 115-21 MCG/ACT inhaler Inhale 2 puffs into the lungs 2 (two) times daily.     benzonatate (TESSALON) 200 MG capsule Take 1 capsule (200 mg total) by mouth 3 (three) times daily as needed for cough. 30 capsule 1   predniSONE (DELTASONE) 10 MG tablet 4 tabs for 3 days, then 3 tabs for 3 days, 2 tabs for 3 days, then 1 tab for 3 days, then stop (Patient not taking: Reported on 03/07/2022) 30 tablet 0   No facility-administered medications prior to visit.     Review of Systems:   Constitutional: No weight loss or gain, night sweats, fevers, chills, fatigue, or lassitude. HEENT: No headaches, difficulty swallowing, tooth/dental problems, or sore throat. No sneezing, itching, ear ache, nasal congestion, or post nasal drip CV:  No chest pain, orthopnea, PND, swelling in lower extremities, anasarca, dizziness, palpitations, syncope Resp: +shortness of breath with coughing spells; non-productive, paroxysmal cough; chest congestion/tightness. No hemoptysis. No wheezing.  No chest wall  deformity GI:  No heartburn, indigestion MSK:  No joint pain or swelling.  No decreased range of motion.  No back pain. Neuro: No dizziness or lightheadedness.  Psych: No depression or anxiety. Mood stable.     Physical Exam:  BP (!) 140/80 (BP Location: Right Arm, Cuff Size: Normal)   Pulse 69   Temp 97.9 F (36.6 C)   Ht '5\' 5"'$  (1.651 m)   Wt 186 lb 6.4 oz (84.6 kg)   SpO2 99%   BMI 31.02 kg/m   GEN: Pleasant, interactive, well-appearing; obese; in no acute distress. HEENT:  Normocephalic and atraumatic. PERRLA. Sclera white. Nasal turbinates pink, moist and patent bilaterally. No rhinorrhea present. Oropharynx pink and moist, without exudate or edema. No lesions, ulcerations NECK:  Supple w/ fair ROM. No JVD present.  No lymphadenopathy.   CV: RRR, no m/r/g, no peripheral edema. Pulses intact, +2 bilaterally. No cyanosis, pallor or clubbing. PULMONARY:  Unlabored, regular breathing. Mild end expiratory wheeze; bronchitic cough. No accessory muscle use.  GI: BS present and normoactive. Soft, non-tender to palpation. No organomegaly or masses detected.  MSK:  No erythema, warmth or tenderness. No deformities or joint swelling noted.  Neuro: A/Ox3. No focal deficits noted.   Skin: Warm, no lesions or rashe Psych: Normal affect and behavior. Judgement and thought content appropriate.     Lab Results:  CBC    Component Value Date/Time   WBC 7.2 09/19/2021 1229   WBC 6.7 09/06/2021 0838   RBC 4.20 09/19/2021 1229   HGB 13.2 09/19/2021 1229   HCT 38.2 09/19/2021 1229   PLT 284 09/19/2021 1229   MCV 91.0 09/19/2021 1229   MCH 31.4 09/19/2021 1229   MCHC 34.6 09/19/2021 1229   RDW 12.9 09/19/2021 1229   LYMPHSABS 1.4 09/19/2021 1229   MONOABS 0.4 09/19/2021 1229   EOSABS 0.1 09/19/2021 1229   BASOSABS 0.0 09/19/2021 1229    BMET    Component Value Date/Time   NA 139 09/19/2021 1229   K 4.0 09/19/2021 1229   CL 108 09/19/2021 1229   CO2 24 09/19/2021 1229   GLUCOSE  102 (H) 09/19/2021 1229   BUN 13 09/19/2021 1229   CREATININE 0.83 09/19/2021 1229   CALCIUM 9.5 09/19/2021 1229   GFRNONAA >60 09/19/2021 1229   GFRAA >60 12/10/2019 1225    BNP No results found for: "BNP"   Imaging:  DG Chest 2 View  Result Date: 02/21/2022 CLINICAL DATA:  Provided history: Worsening productive cough. EXAM: CHEST - 2 VIEW COMPARISON:  Chest radiograph 02/07/2022 and earlier. FINDINGS: Heart size within normal limits. Chronic, mild elevation of the right hemidiaphragm. No appreciable airspace consolidation. No evidence of pleural effusion or pneumothorax. No acute bony abnormality identified. Surgical clips within the right upper quadrant of the abdomen. IMPRESSION: No evidence of acute cardiopulmonary abnormality. Electronically Signed   By: Kellie Simmering D.O.   On: 02/21/2022 10:16   DG Chest 2 View  Result Date: 02/07/2022 CLINICAL DATA:  severe cough and wheezing. rt lower lung rough breath sounds. EXAM: CHEST - 2 VIEW COMPARISON:  Radiograph 04/18/2021 FINDINGS: Cardiomediastinal silhouette is within normal limits. There is no focal airspace consolidation. There is no pleural effusion. No pneumothorax. There is no acute osseous abnormality. Thoracic spondylosis. Surgical clips overlie the right axilla and right breast. IMPRESSION: No evidence of acute cardiopulmonary disease. Electronically Signed   By: Maurine Simmering M.D.   On: 02/07/2022 14:28    methylPREDNISolone acetate (DEPO-MEDROL) injection 80 mg     Date Action Dose Route User   02/21/2022 1028 Given 80 mg Intramuscular (Right Ventrogluteal) Monna Fam L, RN          Latest Ref Rng & Units 01/16/2022    8:29 AM  PFT Results  FVC-Pre L 2.60   FVC-Predicted Pre % 80   FVC-Post L 2.57   FVC-Predicted Post % 79   Pre FEV1/FVC % % 82   Post FEV1/FCV % % 84   FEV1-Pre L 2.12   FEV1-Predicted Pre % 86   FEV1-Post L 2.15   DLCO uncorrected ml/min/mmHg 21.53   DLCO UNC% % 105   DLCO corrected  ml/min/mmHg 21.53   DLCO COR %Predicted % 105   DLVA Predicted % 123   TLC L 4.36   TLC % Predicted % 83   RV % Predicted % 68     No results found for: "NITRICOXIDE"      Assessment & Plan:   Asthma with bronchitis Unresolved exacerbation of asthmatic bronchitis with elevated exhaled nitric oxide. CXR from 10/3 was clear. We will treat her with empiric azithromycin and restart  her on prednisone. Depo 120 mg inj x 1 today. Step up to high dose Advair. Re-educated on the importance of controlling her cough and reducing airway irritation. Verbalized understanding. Close follow up.  Patient Instructions  Increase Advair 2 puffs Twice daily. Brush tongue and rinse mouth afterwards Continue Albuterol inhaler 2 puffs every 6 hours as needed for shortness of breath or wheezing. Notify if symptoms persist despite rescue inhaler/neb use. Continue Zyrtec 1 tab daily  Continue hycodan cough syrup 5 mL At bedtime; if other cough control measures are not helping, you can use the hycodan every 8 hours. May cause drowsiness. Do not drive after taking  Mucinex DM Twice daily until cough improves  Chlorpheniramine tab 4 mg over the counter every 6 hours for cough/drainage; may cause drowsiness Tessalon perles (benzonatate) 1 capsule Three times a day for cough Prednisone taper. 4 tabs for 3 days, then 3 tabs for 3 days, 2 tabs for 3 days, then 1 tab for 3 days, then stop. Take in AM with food. Start tomorrow Azithromycin (z pack) - take 2 tabs on day one then 1 tab daily for four additional days  Flonase nasal sprays 2 sprays each nostril daily until symptoms better   Suppress your cough to allow your larynx (voice box) to heal.  Limit talking for the next few days. Avoid throat clearing. Work on cough suppression with the above recommended suppressants.  Use sugar free hard candies or non-menthol cough drops during this time to soothe your throat.  Warm tea with honey and lemon.    Follow up in 1  week with Dr. Erin Fulling or Alanson Aly. If symptoms do not improve or worsen, please contact office for sooner follow up or seek emergency care.     Allergic rhinitis She denies any significant URI symptoms but with her persistent cough and history of allergies, advised we add on flonase to target postnasal drainage control. Continue zyrtec for trigger prevention. See above recommendations   I spent 35 minutes of dedicated to the care of this patient on the date of this encounter to include pre-visit review of records, face-to-face time with the patient discussing conditions above, post visit ordering of testing, clinical documentation with the electronic health record, making appropriate referrals as documented, and communicating necessary findings to members of the patients care team.  Clayton Bibles, NP 03/07/2022  Pt aware and understands NP's role.

## 2022-03-07 NOTE — Assessment & Plan Note (Signed)
She denies any significant URI symptoms but with her persistent cough and history of allergies, advised we add on flonase to target postnasal drainage control. Continue zyrtec for trigger prevention. See above recommendations

## 2022-03-07 NOTE — Patient Instructions (Addendum)
Increase Advair 2 puffs Twice daily. Brush tongue and rinse mouth afterwards Continue Albuterol inhaler 2 puffs every 6 hours as needed for shortness of breath or wheezing. Notify if symptoms persist despite rescue inhaler/neb use. Continue Zyrtec 1 tab daily  Continue hycodan cough syrup 5 mL At bedtime; if other cough control measures are not helping, you can use the hycodan every 8 hours. May cause drowsiness. Do not drive after taking  Mucinex DM Twice daily until cough improves  Chlorpheniramine tab 4 mg over the counter every 6 hours for cough/drainage; may cause drowsiness Tessalon perles (benzonatate) 1 capsule Three times a day for cough Prednisone taper. 4 tabs for 3 days, then 3 tabs for 3 days, 2 tabs for 3 days, then 1 tab for 3 days, then stop. Take in AM with food. Start tomorrow Azithromycin (z pack) - take 2 tabs on day one then 1 tab daily for four additional days  Flonase nasal sprays 2 sprays each nostril daily until symptoms better   Suppress your cough to allow your larynx (voice box) to heal.  Limit talking for the next few days. Avoid throat clearing. Work on cough suppression with the above recommended suppressants.  Use sugar free hard candies or non-menthol cough drops during this time to soothe your throat.  Warm tea with honey and lemon.    Follow up in 1 week with Dr. Erin Fulling or Alanson Aly. If symptoms do not improve or worsen, please contact office for sooner follow up or seek emergency care.

## 2022-03-08 ENCOUNTER — Encounter: Payer: Self-pay | Admitting: Nurse Practitioner

## 2022-03-14 ENCOUNTER — Ambulatory Visit: Payer: Medicare HMO | Admitting: Pulmonary Disease

## 2022-04-04 ENCOUNTER — Ambulatory Visit (INDEPENDENT_AMBULATORY_CARE_PROVIDER_SITE_OTHER): Payer: Medicare HMO

## 2022-04-04 ENCOUNTER — Ambulatory Visit: Payer: Medicare HMO | Admitting: Nurse Practitioner

## 2022-04-04 ENCOUNTER — Encounter: Payer: Self-pay | Admitting: Nurse Practitioner

## 2022-04-04 VITALS — BP 142/70 | HR 89 | Temp 98.5°F | Ht 65.0 in | Wt 183.8 lb

## 2022-04-04 DIAGNOSIS — R059 Cough, unspecified: Secondary | ICD-10-CM | POA: Diagnosis not present

## 2022-04-04 DIAGNOSIS — J45909 Unspecified asthma, uncomplicated: Secondary | ICD-10-CM

## 2022-04-04 DIAGNOSIS — J069 Acute upper respiratory infection, unspecified: Secondary | ICD-10-CM

## 2022-04-04 LAB — NITRIC OXIDE: Nitric Oxide: 18

## 2022-04-04 LAB — POC COVID19 BINAXNOW: SARS Coronavirus 2 Ag: NEGATIVE

## 2022-04-04 NOTE — Assessment & Plan Note (Signed)
URI symptoms. COVID negative today. Target symptom management and cough control. See above.

## 2022-04-04 NOTE — Patient Instructions (Addendum)
Continue Advair 2 puffs Twice daily. Brush tongue and rinse mouth afterwards Continue Albuterol inhaler 2 puffs every 6 hours as needed for shortness of breath or wheezing. Notify if symptoms persist despite rescue inhaler/neb use. Continue Zyrtec 1 tab daily  Continue hycodan cough syrup 5 mL At bedtime; if other cough control measures are not helping, you can use the hycodan every 8 hours. May cause drowsiness. Do not drive after taking Continue Mucinex Twice daily until cough improves  Continue Chlorpheniramine tab 4 mg over the counter every 6 hours for cough/drainage; may cause drowsiness Continue Tessalon perles (benzonatate) 1 capsule Three times a day for cough Continue Flonase nasal sprays 2 sprays each nostril daily until symptoms better   Astelin nasal spray 2 sprays each nostril Twice daily  Saline nasal irrigation 1-2 times a day   Suppress your cough to allow your larynx (voice box) to heal.  Limit talking for the next few days. Avoid throat clearing. Work on cough suppression with the above recommended suppressants.  Use sugar free hard candies or non-menthol cough drops during this time to soothe your throat.  Warm tea with honey and lemon.    Follow up in 1 week with Dr. Erin Fulling or Alanson Aly. If symptoms do not improve or worsen, please contact office for sooner follow up or seek emergency care.

## 2022-04-04 NOTE — Progress Notes (Signed)
$'@Patient'o$  ID: Katherine Hanna, female    DOB: 20-Jan-1956, 66 y.o.   MRN: [id number]  Chief Complaint  Patient presents with   Follow-up    C/o feeling worse, cough-yellow, runny nose-clear, wheezing, increased sob    Referring provider: Ann Held, *  HPI: 66 year old female, never smoker followed for mild reactive airway disease. She is a patient of Dr. August Albino and last seen in office on 03/07/2022 by [facility] NP. Past medical history significant for migraines, allergic rhinitis, depression, stage IA breast cancer.   TEST/EVENTS:  04/19/2021 CT chest wo contrast: atherosclerosis. Interstitial accentuation anteriorly in the right lung which extends up to the level of the lung apex and likely contributes to prior radiation. Mild airway plugging.  01/16/2022 PFT: FVC 80, FEV1 86, ratio 84, TLC 83, DLCOcor 105 02/21/2022: clear lungs  01/16/2022: OV with Dr. Erin Fulling. Reactive airway disease r/t upper respiratory infections. PFTs nl. She had good response to Advair previously. Plans to continue it PRN with URI's and during allergy season.   02/21/2022: OV with Jayvyn Haselton NP for acute visit. Her symptoms initially started the beginning of September. She saw her PCP on 9/19 and was treated for bronchitis with doxycycline and prednisone taper. CXR at the time was clear. COVID was negative. Unfortunately, her symptoms never resolved. Unresolved acute exacerbation with elevated exhaled nitric oxide, likely brought on by URI. She had some improvement with previous doxycycline and prednisone courses but has worsened since completing these. CXR today without evidence of superimposed infection; hold off on further abx. We will treat her with depo inj 80 mg x 1 and prednisone taper. Target cough control measures. She doesn't appear to have any significant URI or GERD symptoms contributing to her cough. Continue ICS/LABA therapy and prn albuterol.  03/07/2022: OV with Maryela Tapper NP for intended follow up;  unfortunately, she is still experiencing persistent cough. She was doing better after the depo injection and on higher doses of prednisone. Once she tapered down to 10 mg then off the steroids, her cough became more violent and paroxysmal. It's primarily non-productive now but sounds congested. She notices an occasional wheeze. She feels like she can tell when her advair starts to wear off because her cough flares up. No worse at night. She feels short of breath with coughing spells; otherwise breathing is at her baseline. She denies fevers, chills, night sweats, lassitude, anorexia, hemoptysis, leg swelling. No significant nasal drainage or GERD symptoms. Does occasionally feel like she has some post nasal drip. She is on Advair 115; uses it twice a day. Takes zyrtec for allergies. She ran out of the tessalon; did feel like they helped. She only takes the hycodan syrup when she can't sleep due to the cough. FeNO was elevated to 39 ppb. Increased Advair to high dose ICS. Treated with extended prednisone taper and z pack. Advised she start flonase for postnasal drainage.   04/04/2022: Today - Follow up Patient presents today for follow up. She was doing better after our last course of steroids and antibiotics. Cough didn't entirely resolve but was much less severe. She went to [place] last week to help her daughter move; she returned on Sunday and had worsening nasal congestion with clear drainage, headaches, and her cough had worsened. She's getting up yellow phlegm, especially in the mornings. Hurts to cough. She also feels slightly more short of breath, mainly with coughing spells. She denies any fevers, chills, hemoptysis, night sweats, leg swelling, orthopnea. She is eating  and drinking well. No N/V. No know sick exposures. She's using the higher dosed Advair, prescribed at her last visit. She did feel like this was helping. She's taking zyrtec daily and flonase nasal spray still. She tired the tessalon  perles but they didn't touch her cough. Started using chlortab again, every 6 hours. She does use the hycodan cough syrup at night, which has allowed her to sleep.   Allergies  Allergen Reactions   Penicillins Rash    Rash head to toe    Immunization History  Administered Date(s) Administered   Fluad Quad(high Dose 65+) 03/28/2019, 02/03/2022   Hep A / Hep B 03/15/2010, 07/25/2010   Influenza Whole 02/19/2013   Influenza, High Dose Seasonal PF 03/05/2020   Influenza,inj,Quad PF,6+ Mos 03/13/2014   Influenza-Unspecified 03/23/2015, 03/28/2017, 03/14/2018   Moderna Covid-19 Vaccine Bivalent Booster 69yr & up 03/09/2021   Moderna Sars-Covid-2 Vaccination 07/04/2019, 07/28/2019, 03/19/2020, 12/10/2020   PNEUMOCOCCAL CONJUGATE-20 09/06/2021   Pneumococcal Conjugate-13 11/21/2019   Pneumococcal Polysaccharide-23 06/18/2015   Tdap 09/02/2006, 07/30/2017   Zoster Recombinat (Shingrix) 08/02/2018, 12/09/2018   Zoster, Live 04/28/2014    Past Medical History:  Diagnosis Date   Allergic rhinitis    Arthritis    knee   Asthma    [place]    History of chicken pox    Migraine    Rheumatic fever    as a child.    Tobacco History: Social History   Tobacco Use  Smoking Status Never  Smokeless Tobacco Never   Counseling given: Not Answered   Outpatient Medications Prior to Visit  Medication Sig Dispense Refill   albuterol (VENTOLIN HFA) 108 (90 Base) MCG/ACT inhaler Inhale 2 puffs into the lungs every 6 (six) hours as needed for wheezing or shortness of breath. 8 g 0   anastrozole (ARIMIDEX) 1 MG tablet Take 1 tablet (1 mg total) by mouth daily. 90 tablet 3   benzonatate (TESSALON) 200 MG capsule Take 1 capsule (200 mg total) by mouth 3 (three) times daily as needed for cough. 30 capsule 1   Calcium Carbonate-Vitamin D (CALCIUM 500 + D PO) Take 1 tablet by mouth 2 (two) times daily.     cetirizine (ZYRTEC) 10 MG tablet Take 10 mg by mouth at bedtime.     co-enzyme  Q-10 30 MG capsule Take 30 mg by mouth daily.     fluticasone (FLONASE) 50 MCG/ACT nasal spray Place 2 sprays into both nostrils daily. 18.2 mL 2   fluticasone-salmeterol (ADVAIR HFA) 230-21 MCG/ACT inhaler Inhale 2 puffs into the lungs 2 (two) times daily. 1 each 5   HYDROcodone bit-homatropine (HYCODAN) 5-1.5 MG/5ML syrup Take 5 mLs by mouth every 8 (eight) hours as needed for cough. 120 mL 0   Misc Natural Products (TART CHERRY ADVANCED PO) Take by mouth.     Multiple Vitamin (MULTIVITAMIN) tablet Take 1 tablet by mouth daily.     TURMERIC PO Take by mouth.     azithromycin (ZITHROMAX) 250 MG tablet Take 2 tablets on day one then take 1 tablet daily for four additional days (Patient not taking: Reported on 04/04/2022) 6 tablet 0   predniSONE (DELTASONE) 10 MG tablet 4 tabs for 3 days, then 3 tabs for 3 days, 2 tabs for 3 days, then 1 tab for 3 days, then stop (Patient not taking: Reported on 04/04/2022) 30 tablet 0   No facility-administered medications prior to visit.     Review of Systems:   Constitutional: No weight loss  or gain, night sweats, fevers, chills, fatigue, or lassitude. HEENT: No difficulty swallowing, tooth/dental problems, or sore throat. No sneezing, itching, ear ache. +nasal congestion, post nasal drip, headaches CV:  No chest pain, orthopnea, PND, swelling in lower extremities, anasarca, dizziness, palpitations, syncope Resp: +shortness of breath with coughing spells; productive, paroxysmal cough; chest congestion/tightness, wheezing. No hemoptysis. No chest wall deformity GI:  No heartburn, indigestion, nausea, vomiting, diarrhea MSK:  No joint pain or swelling.  No decreased range of motion.  No back pain. Neuro: No dizziness or lightheadedness.  Psych: No depression or anxiety. Mood stable.     Physical Exam:  BP (!) 142/70 (BP Location: Left Arm, Cuff Size: Normal)   Pulse 89   Temp 98.5 F (36.9 C) (Temporal)   Ht '5\' 5"'$  (1.651 m)   Wt 183 lb 12.8 oz (83.4  kg)   SpO2 100%   BMI 30.59 kg/m   GEN: Pleasant, interactive, well-appearing; obese; in no acute distress. HEENT:  Normocephalic and atraumatic. PERRLA. Sclera white. Nasal turbinates pink, moist and patent bilaterally. No rhinorrhea present. Oropharynx pink and moist, without exudate or edema. No lesions, ulcerations NECK:  Supple w/ fair ROM. No JVD present.  No lymphadenopathy.   CV: RRR, no m/r/g, no peripheral edema. Pulses intact, +2 bilaterally. No cyanosis, pallor or clubbing. PULMONARY:  Unlabored, regular breathing. Minimal scattered rhonchi b/l bases otherwise clear. No accessory muscle use.  GI: BS present and normoactive. Soft, non-tender to palpation. No organomegaly or masses detected.  MSK: No erythema, warmth or tenderness. No deformities or joint swelling noted.  Neuro: A/Ox3. No focal deficits noted.   Skin: Warm, no lesions or rashe Psych: Normal affect and behavior. Judgement and thought content appropriate.     Lab Results:  CBC    Component Value Date/Time   WBC 7.2 09/19/2021 1229   WBC 6.7 09/06/2021 0838   RBC 4.20 09/19/2021 1229   HGB 13.2 09/19/2021 1229   HCT 38.2 09/19/2021 1229   PLT 284 09/19/2021 1229   MCV 91.0 09/19/2021 1229   MCH 31.4 09/19/2021 1229   MCHC 34.6 09/19/2021 1229   RDW 12.9 09/19/2021 1229   LYMPHSABS 1.4 09/19/2021 1229   MONOABS 0.4 09/19/2021 1229   EOSABS 0.1 09/19/2021 1229   BASOSABS 0.0 09/19/2021 1229    BMET    Component Value Date/Time   NA 139 09/19/2021 1229   K 4.0 09/19/2021 1229   CL 108 09/19/2021 1229   CO2 24 09/19/2021 1229   GLUCOSE 102 (H) 09/19/2021 1229   BUN 13 09/19/2021 1229   CREATININE 0.83 09/19/2021 1229   CALCIUM 9.5 09/19/2021 1229   GFRNONAA >60 09/19/2021 1229   GFRAA >60 12/10/2019 1225    BNP No results found for: "BNP"   Imaging:  DG Chest 2 View  Result Date: 04/04/2022 CLINICAL DATA:  Productive cough EXAM: CHEST - 2 VIEW COMPARISON:  02/21/2022 chest radiograph.  FINDINGS: Surgical clips overlie right axilla and right breast. Stable cardiomediastinal silhouette with normal heart size. No pneumothorax. No pleural effusion. Lungs appear clear, with no acute consolidative airspace disease and no pulmonary edema. IMPRESSION: No active cardiopulmonary disease. Electronically Signed   By: Ilona Sorrel M.D.   On: 04/04/2022 09:10    methylPREDNISolone acetate (DEPO-MEDROL) injection 80 mg     Date Action Dose Route User   02/21/2022 1028 Given 80 mg Intramuscular (Right Ventrogluteal) Monna Fam L, RN      methylPREDNISolone acetate (DEPO-MEDROL) injection 120 mg  Date Action Dose Route User   03/07/2022 [phone] Given 120 mg Intramuscular (Right Ventrogluteal) Ronney Lion, Lorenso Courier, LPN          Latest Ref Rng & Units 01/16/2022    8:29 AM  PFT Results  FVC-Pre L 2.60   FVC-Predicted Pre % 80   FVC-Post L 2.57   FVC-Predicted Post % 79   Pre FEV1/FVC % % 82   Post FEV1/FCV % % 84   FEV1-Pre L 2.12   FEV1-Predicted Pre % 86   FEV1-Post L 2.15   DLCO uncorrected ml/min/mmHg 21.53   DLCO UNC% % 105   DLCO corrected ml/min/mmHg 21.53   DLCO COR %Predicted % 105   DLVA Predicted % 123   TLC L 4.36   TLC % Predicted % 83   RV % Predicted % 68     Lab Results  Component Value Date   NITRICOXIDE 18 04/04/2022        Assessment & Plan:   Asthma with bronchitis She has been treated with multiple rounds of steroids and abx for asthmatic bronchitis with elevated exhaled nitric oxide. She was improving after the most recent course. Unfortunately, seems to have contracted a URI after her recent travels. FeNO is normal today and CXR is clear. We are going to hold off on further abx and steroids. Target cough control measures and URI symptom management. Continue high dose Advair.  Patient Instructions  Continue Advair 2 puffs Twice daily. Brush tongue and rinse mouth afterwards Continue Albuterol inhaler 2 puffs every 6 hours as needed for  shortness of breath or wheezing. Notify if symptoms persist despite rescue inhaler/neb use. Continue Zyrtec 1 tab daily  Continue hycodan cough syrup 5 mL At bedtime; if other cough control measures are not helping, you can use the hycodan every 8 hours. May cause drowsiness. Do not drive after taking Continue Mucinex Twice daily until cough improves  Continue Chlorpheniramine tab 4 mg over the counter every 6 hours for cough/drainage; may cause drowsiness Continue Tessalon perles (benzonatate) 1 capsule Three times a day for cough Continue Flonase nasal sprays 2 sprays each nostril daily until symptoms better   Astelin nasal spray 2 sprays each nostril Twice daily  Saline nasal irrigation 1-2 times a day   Suppress your cough to allow your larynx (voice box) to heal.  Limit talking for the next few days. Avoid throat clearing. Work on cough suppression with the above recommended suppressants.  Use sugar free hard candies or non-menthol cough drops during this time to soothe your throat.  Warm tea with honey and lemon.    Follow up in 1 week with Dr. Erin Fulling or Alanson Aly. If symptoms do not improve or worsen, please contact office for sooner follow up or seek emergency care.    URI (upper respiratory infection) URI symptoms. COVID negative today. Target symptom management and cough control. See above.   I spent 35 minutes of dedicated to the care of this patient on the date of this encounter to include pre-visit review of records, face-to-face time with the patient discussing conditions above, post visit ordering of testing, clinical documentation with the electronic health record, making appropriate referrals as documented, and communicating necessary findings to members of the patients care team.  Clayton Bibles, NP 04/04/2022  Pt aware and understands NP's role.

## 2022-04-04 NOTE — Assessment & Plan Note (Signed)
She has been treated with multiple rounds of steroids and abx for asthmatic bronchitis with elevated exhaled nitric oxide. She was improving after the most recent course. Unfortunately, seems to have contracted a URI after her recent travels. FeNO is normal today and CXR is clear. We are going to hold off on further abx and steroids. Target cough control measures and URI symptom management. Continue high dose Advair.  Patient Instructions  Continue Advair 2 puffs Twice daily. Brush tongue and rinse mouth afterwards Continue Albuterol inhaler 2 puffs every 6 hours as needed for shortness of breath or wheezing. Notify if symptoms persist despite rescue inhaler/neb use. Continue Zyrtec 1 tab daily  Continue hycodan cough syrup 5 mL At bedtime; if other cough control measures are not helping, you can use the hycodan every 8 hours. May cause drowsiness. Do not drive after taking Continue Mucinex Twice daily until cough improves  Continue Chlorpheniramine tab 4 mg over the counter every 6 hours for cough/drainage; may cause drowsiness Continue Tessalon perles (benzonatate) 1 capsule Three times a day for cough Continue Flonase nasal sprays 2 sprays each nostril daily until symptoms better   Astelin nasal spray 2 sprays each nostril Twice daily  Saline nasal irrigation 1-2 times a day   Suppress your cough to allow your larynx (voice box) to heal.  Limit talking for the next few days. Avoid throat clearing. Work on cough suppression with the above recommended suppressants.  Use sugar free hard candies or non-menthol cough drops during this time to soothe your throat.  Warm tea with honey and lemon.    Follow up in 1 week with Dr. Erin Fulling or Alanson Aly. If symptoms do not improve or worsen, please contact office for sooner follow up or seek emergency care.

## 2022-04-05 ENCOUNTER — Telehealth: Payer: Self-pay | Admitting: Nurse Practitioner

## 2022-04-05 NOTE — Telephone Encounter (Signed)
I called this pt to give response per Joellen Jersey and there was no answer- LMTCB.

## 2022-04-05 NOTE — Telephone Encounter (Signed)
CXR was clear yesterday without evidence of infection. Likely related to viral process. Tylenol or ibuprofen OTC for fevers/pain. Continue all other recommendations from yesterday. I'm glad her cough is better. Recommend she call us if symptoms worsen/do not improve.

## 2022-04-05 NOTE — Telephone Encounter (Signed)
Spoke with the pt  She states she is running a fever today- 101.2  She has aches and chills, sinus pain and right ear pain  She has been wheezing some   She says her cough has pretty much resolved  Joellen Jersey, please advise thanks!  Allergies  Allergen Reactions   Penicillins Rash    Rash head to toe

## 2022-04-06 MED ORDER — DOXYCYCLINE HYCLATE 100 MG PO TABS: 100.0000 mg | ORAL_TABLET | Freq: Two times a day (BID) | ORAL | 0 refills | Status: DC

## 2022-04-06 NOTE — Telephone Encounter (Signed)
Called and spoke with pt who states she feels like she might be getting a sinus infection. States that she is getting out a lot of yellow nasal drainage and states that her right ear has been bothering and also states that her teeth are aching.  Due to this, pt wants to know what might be able to be recommended.  Katie, please advise.

## 2022-04-06 NOTE — Telephone Encounter (Signed)
Given persistent sinus symptoms now with acute worsening, we will treat her for bacterial coinfection with doxycycline course. Please send 1 tab Twice daily for 10 days. Take with food. Increased risk for sunburn while taking - wear sunscreen when outside. Continue previous recommendations.

## 2022-04-06 NOTE — Telephone Encounter (Signed)
Called and spoke with pt letting her know recs per [facility] and she verbalized understanding. Rx for doxy has been sent to preferred pharmacy for pt. Nothing further needed.

## 2022-04-11 ENCOUNTER — Ambulatory Visit (INDEPENDENT_AMBULATORY_CARE_PROVIDER_SITE_OTHER): Payer: Medicare HMO | Admitting: Nurse Practitioner

## 2022-04-11 ENCOUNTER — Encounter: Payer: Self-pay | Admitting: Nurse Practitioner

## 2022-04-11 VITALS — BP 122/72 | HR 83 | Ht 65.0 in | Wt 182.6 lb

## 2022-04-11 DIAGNOSIS — J4540 Moderate persistent asthma, uncomplicated: Secondary | ICD-10-CM | POA: Diagnosis not present

## 2022-04-11 DIAGNOSIS — J069 Acute upper respiratory infection, unspecified: Secondary | ICD-10-CM | POA: Diagnosis not present

## 2022-04-11 DIAGNOSIS — J4541 Moderate persistent asthma with (acute) exacerbation: Secondary | ICD-10-CM | POA: Insufficient documentation

## 2022-04-11 NOTE — Assessment & Plan Note (Signed)
URI with fevers. Improving symptoms. COVID was negative. She will continue supportive care measures.

## 2022-04-11 NOTE — Progress Notes (Signed)
$'@Patient'b$  ID: Katherine Hanna, female    DOB: [birth year], 66 y.o.   MRN: [id number]  Chief Complaint  Patient presents with   Follow-up    Pt f/u she reports feeling better she has more energy,  does still have a cough (worse at night) & fluid behind right ear drum    Referring provider: Carollee Herter, Alferd Apa, *  HPI: 66 year old female, never smoker followed for mild reactive airway disease. She is a patient of Dr. August Albino and last seen in office on 04/04/2022 by [facility] NP. Past medical history significant for migraines, allergic rhinitis, depression, stage IA breast cancer.   TEST/EVENTS:  04/19/2021 CT chest wo contrast: atherosclerosis. Interstitial accentuation anteriorly in the right lung which extends up to the level of the lung apex and likely contributes to prior radiation. Mild airway plugging.  01/16/2022 PFT: FVC 80, FEV1 86, ratio 84, TLC 83, DLCOcor 105 02/21/2022: clear lungs  01/16/2022: OV with Dr. Erin Fulling. Reactive airway disease r/t upper respiratory infections. PFTs nl. She had good response to Advair previously. Plans to continue it PRN with URI's and during allergy season.   02/21/2022: OV with Lavilla Delamora NP for acute visit. Her symptoms initially started the beginning of September. She saw her PCP on 9/19 and was treated for bronchitis with doxycycline and prednisone taper. CXR at the time was clear. COVID was negative. Unfortunately, her symptoms never resolved. Unresolved acute exacerbation with elevated exhaled nitric oxide, likely brought on by URI. She had some improvement with previous doxycycline and prednisone courses but has worsened since completing these. CXR today without evidence of superimposed infection; hold off on further abx. We will treat her with depo inj 80 mg x 1 and prednisone taper. Target cough control measures. She doesn't appear to have any significant URI or GERD symptoms contributing to her cough. Continue ICS/LABA therapy and prn albuterol.  03/07/2022:  OV with Alesa Echevarria NP for intended follow up; unfortunately, she is still experiencing persistent cough. She was doing better after the depo injection and on higher doses of prednisone. Once she tapered down to 10 mg then off the steroids, her cough became more violent and paroxysmal. It's primarily non-productive now but sounds congested. She notices an occasional wheeze. She feels like she can tell when her advair starts to wear off because her cough flares up. No worse at night. She feels short of breath with coughing spells; otherwise breathing is at her baseline. She denies fevers, chills, night sweats, lassitude, anorexia, hemoptysis, leg swelling. No significant nasal drainage or GERD symptoms. Does occasionally feel like she has some post nasal drip. She is on Advair 115; uses it twice a day. Takes zyrtec for allergies. She ran out of the tessalon; did feel like they helped. She only takes the hycodan syrup when she can't sleep due to the cough. FeNO was elevated to 39 ppb. Increased Advair to high dose ICS. Treated with extended prednisone taper and z pack. Advised she start flonase for postnasal drainage.   04/04/2022: OV with Nadim Malia NP for follow up. She was doing better after our last course of steroids and antibiotics. Cough didn't entirely resolve but was much less severe. She went to [place] last week to help her daughter move; she returned on Sunday and had worsening nasal congestion with clear drainage, headaches, and her cough had worsened. She's getting up yellow phlegm, especially in the mornings. Hurts to cough. She also feels slightly more short of breath, mainly with coughing spells. She  denies any fevers, chills, hemoptysis, night sweats, leg swelling, orthopnea. She is eating and drinking well. No N/V. No know sick exposures. She's using the higher dosed Advair, prescribed at her last visit. She did feel like this was helping. She's taking zyrtec daily and flonase nasal spray still. She tired the  tessalon perles but they didn't touch her cough. Started using chlortab again, every 6 hours. She does use the hycodan cough syrup at night, which has allowed her to sleep. CXR was clear. COVID negative. FeNO improved. Treated with supportive care and cough control measures.   04/11/2022: Today - follow up Patient presents today for follow-up.  After she was seen in the office, she contacted 11/15 and reported that she had developed a fever up to 101.2.  She was advised to continue with supportive care measures as symptoms are likely viral in nature.  She then called back and on 11/16 with worsening sinus symptoms.  She was having a lot of sinus pressure and right ear pressure.  She also had purulent nasal drainage.  She was treated with 10-day course of doxycycline for bacterial rhinosinusitis. Today, she tells me that she is feeling much better.  Feels like the pressure has started to lessen and she is not coughing nearly as frequently as she was.  Breathing is at her baseline.  She has not had any recurrent fevers.  Still has some nasal drainage which she is using her nasal sprays and saline rinses for.  Denies any chills, ear pain/drainage, sore throat, wheezing, increased chest congestion.  She is using her Advair twice daily.  Has not had to use take it in over the last few days.  Still taking Mucinex twice daily, Flonase, Astelin and doing saline nasal irrigations.  Allergies  Allergen Reactions   Penicillins Rash    Rash head to toe    Immunization History  Administered Date(s) Administered   Fluad Quad(high Dose 65+) 03/28/2019, 02/03/2022   Hep A / Hep B 03/15/2010, 07/25/2010   Influenza Whole 02/19/2013   Influenza, High Dose Seasonal PF 03/05/2020   Influenza,inj,Quad PF,6+ Mos 03/13/2014   Influenza-Unspecified 03/23/2015, 03/28/2017, 03/14/2018   Moderna Covid-19 Vaccine Bivalent Booster 53yr & up 03/09/2021   Moderna Sars-Covid-2 Vaccination 07/04/2019, 07/28/2019, 03/19/2020,  12/10/2020   PNEUMOCOCCAL CONJUGATE-20 09/06/2021   Pneumococcal Conjugate-13 11/21/2019   Pneumococcal Polysaccharide-23 06/18/2015   Tdap 09/02/2006, 07/30/2017   Zoster Recombinat (Shingrix) 08/02/2018, 12/09/2018   Zoster, Live 04/28/2014    Past Medical History:  Diagnosis Date   Allergic rhinitis    Arthritis    knee   Asthma    [place]    History of chicken pox    Migraine    Rheumatic fever    as a child.    Tobacco History: Social History   Tobacco Use  Smoking Status Never  Smokeless Tobacco Never   Counseling given: Not Answered   Outpatient Medications Prior to Visit  Medication Sig Dispense Refill   albuterol (VENTOLIN HFA) 108 (90 Base) MCG/ACT inhaler Inhale 2 puffs into the lungs every 6 (six) hours as needed for wheezing or shortness of breath. 8 g 0   anastrozole (ARIMIDEX) 1 MG tablet Take 1 tablet (1 mg total) by mouth daily. 90 tablet 3   benzonatate (TESSALON) 200 MG capsule Take 1 capsule (200 mg total) by mouth 3 (three) times daily as needed for cough. 30 capsule 1   Calcium Carbonate-Vitamin D (CALCIUM 500 + D PO) Take 1 tablet by  mouth 2 (two) times daily.     cetirizine (ZYRTEC) 10 MG tablet Take 10 mg by mouth at bedtime.     co-enzyme Q-10 30 MG capsule Take 30 mg by mouth daily.     doxycycline (VIBRA-TABS) 100 MG tablet Take 1 tablet (100 mg total) by mouth 2 (two) times daily. 20 tablet 0   fluticasone (FLONASE) 50 MCG/ACT nasal spray Place 2 sprays into both nostrils daily. 18.2 mL 2   fluticasone-salmeterol (ADVAIR HFA) 230-21 MCG/ACT inhaler Inhale 2 puffs into the lungs 2 (two) times daily. 1 each 5   HYDROcodone bit-homatropine (HYCODAN) 5-1.5 MG/5ML syrup Take 5 mLs by mouth every 8 (eight) hours as needed for cough. 120 mL 0   Misc Natural Products (TART CHERRY ADVANCED PO) Take by mouth.     Multiple Vitamin (MULTIVITAMIN) tablet Take 1 tablet by mouth daily.     TURMERIC PO Take by mouth.     azithromycin (ZITHROMAX)  250 MG tablet Take 2 tablets on day one then take 1 tablet daily for four additional days 6 tablet 0   predniSONE (DELTASONE) 10 MG tablet 4 tabs for 3 days, then 3 tabs for 3 days, 2 tabs for 3 days, then 1 tab for 3 days, then stop 30 tablet 0   No facility-administered medications prior to visit.     Review of Systems:   Constitutional: No weight loss or gain, night sweats, fevers, chills, fatigue, or lassitude. HEENT: No headaches, difficulty swallowing, tooth/dental problems, or sore throat. No sneezing, itching, ear ache. +nasal congestion, sinus pressure (improved) CV:  No chest pain, orthopnea, PND, swelling in lower extremities, anasarca, dizziness, palpitations, syncope Resp: +cough (improved); chest congestion (improving). No shortness of breath with exertion or at rest. No wheezing. No hemoptysis. No chest wall deformity GI:  No heartburn, indigestion, nausea, vomiting, diarrhea MSK:  No joint pain or swelling.  No decreased range of motion.  No back pain. Neuro: No dizziness or lightheadedness.  Psych: No depression or anxiety. Mood stable.     Physical Exam:  BP 122/72   Pulse 83   Ht '5\' 5"'$  (1.651 m)   Wt 182 lb 9.6 oz (82.8 kg)   SpO2 97%   BMI 30.39 kg/m   GEN: Pleasant, interactive, well-appearing; obese; in no acute distress. HEENT:  Normocephalic and atraumatic. PERRLA. Sclera white. Nasal turbinates pink, moist and patent bilaterally. No rhinorrhea present. Oropharynx pink and moist, without exudate or edema. No lesions, ulcerations NECK:  Supple w/ fair ROM. No JVD present.  No lymphadenopathy.   CV: RRR, no m/r/g, no peripheral edema. Pulses intact, +2 bilaterally. No cyanosis, pallor or clubbing. PULMONARY:  Unlabored, regular breathing. Minimal end expiratory wheeze bases otherwise clear. No accessory muscle use.  GI: BS present and normoactive. Soft, non-tender to palpation. No organomegaly or masses detected.  MSK: No erythema, warmth or tenderness. No  deformities or joint swelling noted.  Neuro: A/Ox3. No focal deficits noted.   Skin: Warm, no lesions or rashe Psych: Normal affect and behavior. Judgement and thought content appropriate.     Lab Results:  CBC    Component Value Date/Time   WBC 7.2 09/19/2021 1229   WBC 6.7 09/06/2021 0838   RBC 4.20 09/19/2021 1229   HGB 13.2 09/19/2021 1229   HCT 38.2 09/19/2021 1229   PLT 284 09/19/2021 1229   MCV 91.0 09/19/2021 1229   MCH 31.4 09/19/2021 1229   MCHC 34.6 09/19/2021 1229   RDW 12.9 09/19/2021 1229  LYMPHSABS 1.4 09/19/2021 1229   MONOABS 0.4 09/19/2021 1229   EOSABS 0.1 09/19/2021 1229   BASOSABS 0.0 09/19/2021 1229    BMET    Component Value Date/Time   NA 139 09/19/2021 1229   K 4.0 09/19/2021 1229   CL 108 09/19/2021 1229   CO2 24 09/19/2021 1229   GLUCOSE 102 (H) 09/19/2021 1229   BUN 13 09/19/2021 1229   CREATININE 0.83 09/19/2021 1229   CALCIUM 9.5 09/19/2021 1229   GFRNONAA >60 09/19/2021 1229   GFRAA >60 12/10/2019 1225    BNP No results found for: "BNP"   Imaging:  DG Chest 2 View  Result Date: 04/04/2022 CLINICAL DATA:  Productive cough EXAM: CHEST - 2 VIEW COMPARISON:  02/21/2022 chest radiograph. FINDINGS: Surgical clips overlie right axilla and right breast. Stable cardiomediastinal silhouette with normal heart size. No pneumothorax. No pleural effusion. Lungs appear clear, with no acute consolidative airspace disease and no pulmonary edema. IMPRESSION: No active cardiopulmonary disease. Electronically Signed   By: Ilona Sorrel M.D.   On: 04/04/2022 09:10    methylPREDNISolone acetate (DEPO-MEDROL) injection 80 mg     Date Action Dose Route User   02/21/2022 1028 Given 80 mg Intramuscular (Right Ventrogluteal) Monna Fam L, RN      methylPREDNISolone acetate (DEPO-MEDROL) injection 120 mg     Date Action Dose Route User   03/07/2022 0903 Given 120 mg Intramuscular (Right Ventrogluteal) Ronney Lion, Lorenso Courier, LPN           Latest Ref Rng & Units 01/16/2022    8:29 AM  PFT Results  FVC-Pre L 2.60   FVC-Predicted Pre % 80   FVC-Post L 2.57   FVC-Predicted Post % 79   Pre FEV1/FVC % % 82   Post FEV1/FCV % % 84   FEV1-Pre L 2.12   FEV1-Predicted Pre % 86   FEV1-Post L 2.15   DLCO uncorrected ml/min/mmHg 21.53   DLCO UNC% % 105   DLCO corrected ml/min/mmHg 21.53   DLCO COR %Predicted % 105   DLVA Predicted % 123   TLC L 4.36   TLC % Predicted % 83   RV % Predicted % 68     Lab Results  Component Value Date   NITRICOXIDE 18 04/04/2022        Assessment & Plan:   Moderate persistent asthma Resolving exacerbation/acute bronchitis related to URI. Clinically improved today. She will continue on ICS/LABA therapy with Advair. Encouraged her to continue deep breathing exercises and use nebs at least twice daily until back to baseline. Asthma action plan in place.   Patient Instructions  Continue Advair 2 puffs Twice daily. Brush tongue and rinse mouth afterwards Continue Albuterol inhaler 2 puffs every 6 hours as needed for shortness of breath or wheezing. Notify if symptoms persist despite rescue inhaler/neb use. Continue Zyrtec 1 tab daily  Continue hycodan cough syrup 5 mL At bedtime; if other cough control measures are not helping, you can use the hycodan every 8 hours. May cause drowsiness. Do not drive after taking Continue Mucinex Twice daily until cough improves  Continue Chlorpheniramine tab 4 mg over the counter every 6 hours for cough/drainage; may cause drowsiness Continue Tessalon perles (benzonatate) 1 capsule Three times a day for cough Continue Flonase nasal sprays 2 sprays each nostril daily until symptoms better  Continue Astelin nasal spray 2 sprays each nostril Twice daily  Continue Saline nasal irrigation 1-2 times a day   Follow up in 6 weeks with Dr.  Dewald or Katie Elizar Alpern,NP. If symptoms do not improve or worsen, please contact office for sooner follow up or seek emergency care.     URI (upper respiratory infection) URI with fevers. Improving symptoms. COVID was negative. She will continue supportive care measures.     I spent 28 minutes of dedicated to the care of this patient on the date of this encounter to include pre-visit review of records, face-to-face time with the patient discussing conditions above, post visit ordering of testing, clinical documentation with the electronic health record, making appropriate referrals as documented, and communicating necessary findings to members of the patients care team.  Clayton Bibles, NP 04/11/2022  Pt aware and understands NP's role.

## 2022-04-11 NOTE — Assessment & Plan Note (Addendum)
Resolving exacerbation/acute bronchitis related to URI. Clinically improved today. She will continue on ICS/LABA therapy with Advair. Encouraged her to continue deep breathing exercises and use nebs at least twice daily until back to baseline. Asthma action plan in place.   Patient Instructions  Continue Advair 2 puffs Twice daily. Brush tongue and rinse mouth afterwards Continue Albuterol inhaler 2 puffs every 6 hours as needed for shortness of breath or wheezing. Notify if symptoms persist despite rescue inhaler/neb use. Continue Zyrtec 1 tab daily  Continue hycodan cough syrup 5 mL At bedtime; if other cough control measures are not helping, you can use the hycodan every 8 hours. May cause drowsiness. Do not drive after taking Continue Mucinex Twice daily until cough improves  Continue Chlorpheniramine tab 4 mg over the counter every 6 hours for cough/drainage; may cause drowsiness Continue Tessalon perles (benzonatate) 1 capsule Three times a day for cough Continue Flonase nasal sprays 2 sprays each nostril daily until symptoms better  Continue Astelin nasal spray 2 sprays each nostril Twice daily  Continue Saline nasal irrigation 1-2 times a day   Follow up in 6 weeks with Dr. Erin Fulling or Katie Jordanny Waddington,NP. If symptoms do not improve or worsen, please contact office for sooner follow up or seek emergency care.

## 2022-04-11 NOTE — Patient Instructions (Addendum)
Continue Advair 2 puffs Twice daily. Brush tongue and rinse mouth afterwards Continue Albuterol inhaler 2 puffs every 6 hours as needed for shortness of breath or wheezing. Notify if symptoms persist despite rescue inhaler/neb use. Continue Zyrtec 1 tab daily  Continue hycodan cough syrup 5 mL At bedtime; if other cough control measures are not helping, you can use the hycodan every 8 hours. May cause drowsiness. Do not drive after taking Continue Mucinex Twice daily until cough improves  Continue Chlorpheniramine tab 4 mg over the counter every 6 hours for cough/drainage; may cause drowsiness Continue Tessalon perles (benzonatate) 1 capsule Three times a day for cough Continue Flonase nasal sprays 2 sprays each nostril daily until symptoms better  Continue Astelin nasal spray 2 sprays each nostril Twice daily  Continue Saline nasal irrigation 1-2 times a day   Follow up in 6 weeks with Dr. Erin Hanna or Katherine Jaz Laningham,NP. If symptoms do not improve or worsen, please contact office for sooner follow up or seek emergency care.

## 2022-05-07 ENCOUNTER — Other Ambulatory Visit: Payer: Self-pay | Admitting: Nurse Practitioner

## 2022-05-07 DIAGNOSIS — C50211 Malignant neoplasm of upper-inner quadrant of right female breast: Secondary | ICD-10-CM

## 2022-05-07 NOTE — Progress Notes (Unsigned)
[place]   [phone] [phone]   Clinic Follow up Note   Patient Care Team: Carollee Herter, Alferd Apa, DO as PCP - General (Family Medicine) Sable Feil, MD as Consulting Physician (Gastroenterology) Hollar, Katharine Look, MD as Referring Physician (Dermatology) Lynnell Dike, OD as Consulting Physician (Optometry) Stark Klein, MD as Consulting Physician (General Surgery) Truitt Merle, MD as Consulting Physician (Hematology) Alla Feeling, NP as Nurse Practitioner (Nurse Practitioner) Freddi Starr, MD as Consulting Physician (Pulmonary Disease) 05/09/2022  CHIEF COMPLAINT: Follow up right breast cancer   SUMMARY OF ONCOLOGIC HISTORY: Oncology History Overview Note  Cancer Staging Malignant neoplasm of upper-inner quadrant of right breast in female, estrogen receptor positive ([place]) Staging form: Breast, AJCC 8th Edition - Clinical stage from 12/03/2019: Stage IA (cT1c, cN0, cM0, G2, ER+, PR+, HER2-) - Signed by Truitt Merle, MD on 12/09/2019 - Pathologic stage from 01/20/2020: Stage IA (pT1c, pN1a, cM0, G2, ER+, PR+, HER2-) - Signed by Truitt Merle, MD on 04/06/2020    Malignant neoplasm of upper-inner quadrant of right breast in female, estrogen receptor positive ([place])  11/25/2019 Mammogram   Mammogram and [place] 11/25/19  IMPRESSION The 1.1x0.8x0.9cm architectural distortion in the right breast at 3:00 position middle depth 4 cm from nipple is highly suggestive of malignancy. An US guided biopsy is recommended.     12/03/2019 Initial Biopsy   Diagnosis 12/03/19 Breast, right, needle core biopsy, 3 o'clock, 4 cmfn - INVASIVE DUCTAL CARCINOMA. - DUCTAL CARCINOMA IN SITU. Microscopic Comment The carcinoma appears grade 2. The greatest linear extent of tumor in any one core is 10 mm. Ancillary studies will be reported separately. Results reported to [employer] on 12/04/2019. Intradepartmental consultation (Dr. Melina Copa).    12/03/2019 Receptors her2   PROGNOSTIC INDICATORS Results: IMMUNOHISTOCHEMICAL AND MORPHOMETRIC ANALYSIS PERFORMED MANUALLY The tumor cells are EQUIVOCAL for Her2 (2+). Her2 by FISH will be performed and results reported separately. Estrogen Receptor: 95%, POSITIVE, STRONG STAINING INTENSITY Progesterone Receptor: 80%, POSITIVE, STRONG STAINING INTENSITY Proliferation Marker Ki67: 10%   FLUORESCENCE IN-SITU HYBRIDIZATION Results: GROUP 5: HER2 **NEGATIVE** Equivocal form of amplification of the HER2 gene was detected in the IHC 2+ tissue sample received from this individual. HER2 FISH was performed by a technologist and cell imaging and analysis on the [employer].   12/03/2019 Cancer Staging   Staging form: Breast, AJCC 8th Edition - Clinical stage from 12/03/2019: Stage IA (cT1c, cN0, cM0, G2, ER+, PR+, HER2-) - Signed by Truitt Merle, MD on 12/09/2019   12/08/2019 Initial Diagnosis   Malignant neoplasm of upper-inner quadrant of right breast in female, estrogen receptor positive ([place])   01/20/2020 Surgery   RIGHT BREAST LUMPECTOMY WITH RADIOACTIVE SEED AND SENTINEL LYMPH NODE BIOPSY by Barry Dienes    01/20/2020 Pathology Results   FINAL MICROSCOPIC DIAGNOSIS:   A. BREAST, RIGHT, LUMPECTOMY:  - Invasive ductal carcinoma, grade 2, spanning 1.3 cm.  - Intermediate grade ductal carcinoma in situ.  - Invasive carcinoma is <1 mm from the lateral margin focally.  - In situ carcinoma is 3 mm from the lateral margin focally.  - Biopsy site.  - Fibrocystic change and usual ductal hyperplasia.  - See oncology table.   B. LYMPH NODE, RIGHT AXILLARY #1, SENTINEL, EXCISION:  - Metastatic carcinoma in one of one lymph nodes (1/1).  - Focal extracapsular extension.   C. LYMPH NODE, RIGHT AXILLARY #2, SENTINEL, EXCISION:  - One of one lymph nodes negative for carcinoma (0/1).   D. LYMPH NODE,  RIGHT AXILLARY #3, SENTINEL, EXCISION:  - One of one lymph nodes negative for carcinoma (0/1).   E. LYMPH  NODE, RIGHT AXILLARY #4, SENTINEL, EXCISION:  - One of one lymph nodes negative for carcinoma (0/1).     ADDENDUM:   By immunohistochemistry, HER-2 is EQUIVOCAL (2+).  HER-2 by FISH is  pending and will be reported in an addendum.   ADDENDUM:   FLOURESCENCE IN-SITU HYBRIDIZATION RESULTS:   GROUP 5:   HER2 **NEGATIVE**    01/20/2020 Miscellaneous   Mammaprint Low risk Luminal Type A - MPI at +0.311 She has 97.8% benefit of Horomal Therpay alone    01/20/2020 Cancer Staging   Staging form: Breast, AJCC 8th Edition - Pathologic stage from 01/20/2020: Stage IA (pT1c, pN1a, cM0, G2, ER+, PR+, HER2-) - Signed by Truitt Merle, MD on 04/06/2020   02/26/2020 - 04/13/2020 Radiation Therapy   Adjuvant Radiation with Dr Sondra Come    04/2020 -  Anti-estrogen oral therapy   Anastrozole 49m daily starting in 04/2020    07/13/2020 Survivorship   SCP delivered virtually by LCira Rue NP      CURRENT THERAPY:  -Anastrozole 1 mg daily starting 04/2020 -Zometa q6 months x2 years starting 10/06/20  INTERVAL HISTORY: Ms. MExtonreturns for follow up as scheduled. Last seen by Dr. FBurr Medico5/1/23. She received zometa 11/07/21, tolerated well. Mammogram 12/14/21 was negative. She saw Dr. BBarry Dienesin 01/2022 and had no concerns. She recently saw her dentist and has a cavity under a crown, scheduled to have this filled in Jan. She lost her brother and an aunt she was close to recently, she recalls her remarkable life. Mood is stable. Pt had bad lung infection which is being managed with antibiotics and steroids, per pulm; cough has much improved. Denies breast concerns. The scar tissue "knot" is getting smaller. She continues anastrozole, tolerating well with occasional hot flash and no significant joint pain.   All other systems were reviewed with the patient and are negative.  MEDICAL HISTORY:  Past Medical History:  Diagnosis Date   Allergic rhinitis    Arthritis    knee   Asthma    [place]     History of chicken pox    Migraine    Rheumatic fever    as a child.    SURGICAL HISTORY: Past Surgical History:  Procedure Laterality Date   APPENDECTOMY  11/1971   BREAST LUMPECTOMY WITH RADIOACTIVE SEED AND SENTINEL LYMPH NODE BIOPSY Right 01/20/2020   Procedure: RIGHT BREAST LUMPECTOMY WITH RADIOACTIVE SEED AND SENTINEL LYMPH NODE BIOPSY;  Surgeon: BStark Klein MD;  Location[facility]  Service: General;  Laterality: Right;   COLONOSCOPY     TONSILLECTOMY     when she was 2 or 3    I have reviewed the social history and family history with the patient and they are unchanged from previous note.  ALLERGIES:  is allergic to penicillins.  MEDICATIONS:  Current Outpatient Medications  Medication Sig Dispense Refill   albuterol (VENTOLIN HFA) 108 (90 Base) MCG/ACT inhaler Inhale 2 puffs into the lungs every 6 (six) hours as needed for wheezing or shortness of breath. 8 g 0   anastrozole (ARIMIDEX) 1 MG tablet Take 1 tablet (1 mg total) by mouth daily. 90 tablet 3   benzonatate (TESSALON) 200 MG capsule Take 1 capsule (200 mg total) by mouth 3 (three) times daily as needed for cough. 30 capsule 1   Calcium Carbonate-Vitamin D (CALCIUM 500 + D  PO) Take 1 tablet by mouth 2 (two) times daily.     cetirizine (ZYRTEC) 10 MG tablet Take 10 mg by mouth at bedtime.     co-enzyme Q-10 30 MG capsule Take 30 mg by mouth daily.     fluticasone (FLONASE) 50 MCG/ACT nasal spray Place 2 sprays into both nostrils daily. 18.2 mL 2   fluticasone-salmeterol (ADVAIR HFA) 230-21 MCG/ACT inhaler Inhale 2 puffs into the lungs 2 (two) times daily. 1 each 5   HYDROcodone bit-homatropine (HYCODAN) 5-1.5 MG/5ML syrup Take 5 mLs by mouth every 8 (eight) hours as needed for cough. 120 mL 0   Misc Natural Products (TART CHERRY ADVANCED PO) Take by mouth.     Multiple Vitamin (MULTIVITAMIN) tablet Take 1 tablet by mouth daily.     TURMERIC PO Take by mouth.     azithromycin (ZITHROMAX) 250 MG  tablet Take 2 tablets on day one then take 1 tablet daily for four additional days 6 tablet 0   doxycycline (VIBRA-TABS) 100 MG tablet Take 1 tablet (100 mg total) by mouth 2 (two) times daily. 20 tablet 0   predniSONE (DELTASONE) 10 MG tablet 4 tabs for 3 days, then 3 tabs for 3 days, 2 tabs for 3 days, then 1 tab for 3 days, then stop 30 tablet 0   No current facility-administered medications for this visit.    PHYSICAL EXAMINATION: ECOG PERFORMANCE STATUS: 0 - Asymptomatic  Vitals:   05/09/22 0917  BP: 139/77  Pulse: 77  Resp: 18  Temp: 98 F (36.7 C)  SpO2: 100%   Filed Weights   05/09/22 0917  Weight: 185 lb 11.2 oz (84.2 kg)    GENERAL:alert, no distress and comfortable SKIN: No rash EYES: sclera clear OROPHARYNX: No thrush or ulcers.  No obvious dental or periodontal disease or exposed bone NECK: Without mass LYMPH:  no palpable cervical or supraclavicular lymphadenopathy  LUNGS: clear with normal breathing effort, no wheezing or adventitious sounds HEART: regular rate & rhythm, no lower extremity edema ABDOMEN:abdomen soft, non-tender and normal bowel sounds NEURO: alert & oriented x 3 with fluent speech Breast exam: Metrical without nipple discharge or inversion.  S/p right lumpectomy, incisions completely healed with moderate scar tissue in the central/inner breast.  No other palpable mass or nodularity in either breast or axilla that I could appreciate.   LABORATORY DATA:  I have reviewed the data as listed    Latest Ref Rng & Units 05/09/2022    8:40 AM 09/19/2021   12:29 PM 09/06/2021    8:38 AM  CBC  WBC 4.0 - 10.5 K/uL 8.7  7.2  6.7   Hemoglobin 12.0 - 15.0 g/dL 14.1  13.2  13.3   Hematocrit 36.0 - 46.0 % 41.6  38.2  39.9   Platelets 150 - 400 K/uL 324  284  279.0         Latest Ref Rng & Units 09/19/2021   12:29 PM 09/06/2021    8:38 AM 05/09/2021    9:27 AM  CMP  Glucose 70 - 99 mg/dL 102  93  102   BUN 8 - 23 mg/dL _0 Creatinine 0.44 -  1.00 mg/dL 0.83  0.80  0.94   Sodium 135 - 145 mmol/L 139  140  142   Potassium 3.5 - 5.1 mmol/L 4.0  4.3  3.8   Chloride 98 - 111 mmol/L 108  103  109   CO2 22 - 32 mmol/L 24  29  20   Calcium 8.9 - 10.3 mg/dL 9.5  9.7  9.7   Total Protein 6.5 - 8.1 g/dL 7.5  7.0  7.8   Total Bilirubin 0.3 - 1.2 mg/dL 0.5  0.5  0.4   Alkaline Phos 38 - 126 U/L 88  95  86   AST 15 - 41 U/L _0 ALT 0 - 44 U/L _1 RADIOGRAPHIC STUDIES: I have personally reviewed the radiological images as listed and agreed with the findings in the report. No results found.   ASSESSMENT & PLAN:  Katherine Hanna is a 66 y.o. female with    1. Malignant neoplasm of upper-inner quadrant of right breast, Stage 1A, p(T1cN1aM0), ER+/PR+/HER2-, Grade II  -Diagnosed in 11/2019 with Invasive ductal carcinoma with DCIS.  S/p right lumpectomy and SLNB with Dr Barry Dienes on 01/20/20. Her Mammaprint was low risk Luminal Type A. Adjuvant chemotherapy was not recommended given her low risk disease. She also completed adjuvant radiation.  -She did not have genetic testing.  -She began adjuvant antiestrogen therapy with Anastrozole in 04/2020.  -Ms. Delker is clinically doing well.  Tolerating anastrozole with occasional hot flash and no significant joint pain.  Breast exam is benign, labs are unremarkable.  Overall no clinical concern for breast cancer recurrence -Next mammogram is already scheduled in July at [place] breast cancer surveillance and anastrozole.  Anticipate seeing Dr. Barry Dienes next summer -Follow-up with [place] in 6 months, or sooner if needed   2. Anxiety, Arthritis  -She was previously on Zoloft right before her cancer diagnosis.  -She lost her brother and a close aunt this year; she is doing ok with stable mood -She mainly has arthritis in her thumbs, right hand and right knee. Pain is -denies new/worsening pain  3. Osteoporosis  -Her 10/2017 DEXA showed osteoporosis at right femur neck with lowest  T-score -2.8. -She was previously on prolia but did not tolerate it.  Switch to Zometa every 6 months x2 years starting 10/06/2020 after receiving dental clearance  -BMD increased from osteoporosis to osteopenia at the right femur neck, T score now -2.3 -Continue calcium, vitamin D, weightbearing exercise, and Zometa (due 2nd dose today)   4.  Health maintenance -Encouraged her to remain up-to-date on age-appropriate healthcare and cancer screenings  -GI is Dr. Lyndel Safe   5. Bronchitis, recurrent respiratory infection -Flu in 02/2021, subsequently developed bronchitis s/p multiple antibiotics and prednisone tapers.  -CT 04/19/2021 shows no true underlying pulmonary nodule, possible radiation changes in the right lung, and airway plugging in both lower lobes -She recently developed another respiratory infection treated with antibiotics and steroids.  Improving -Continue follow-up with pulmonology   PLAN: -Labs reviewed -Continue breast cancer surveillance and anastrozole -Zometa today, I will CC my note to her dentist -Continue follow-up with Dr. Barry Dienes -Next surveillance visit with [place] in 6 months, or sooner if needed -Mammogram in July as scheduled   All questions were answered. The patient knows to call the clinic with any problems, questions or concerns. No barriers to learning was detected. I spent 20 minutes counseling the patient face to face. The total time spent in the appointment was 30 minutes and more than 50% was on counseling and review of test results.     Alla Feeling, NP 05/09/22

## 2022-05-09 ENCOUNTER — Inpatient Hospital Stay: Payer: Medicare HMO | Attending: Nurse Practitioner | Admitting: Nurse Practitioner

## 2022-05-09 ENCOUNTER — Inpatient Hospital Stay: Payer: Medicare HMO

## 2022-05-09 ENCOUNTER — Encounter: Payer: Self-pay | Admitting: Nurse Practitioner

## 2022-05-09 VITALS — BP 139/77 | HR 77 | Temp 98.0°F | Resp 18 | Ht 65.0 in | Wt 185.7 lb

## 2022-05-09 DIAGNOSIS — M816 Localized osteoporosis [Lequesne]: Secondary | ICD-10-CM

## 2022-05-09 DIAGNOSIS — M19042 Primary osteoarthritis, left hand: Secondary | ICD-10-CM | POA: Diagnosis not present

## 2022-05-09 DIAGNOSIS — M19041 Primary osteoarthritis, right hand: Secondary | ICD-10-CM | POA: Insufficient documentation

## 2022-05-09 DIAGNOSIS — C50211 Malignant neoplasm of upper-inner quadrant of right female breast: Secondary | ICD-10-CM | POA: Insufficient documentation

## 2022-05-09 DIAGNOSIS — Z170 Estrogen receptor positive status [ER+]: Secondary | ICD-10-CM | POA: Insufficient documentation

## 2022-05-09 DIAGNOSIS — Z79811 Long term (current) use of aromatase inhibitors: Secondary | ICD-10-CM | POA: Insufficient documentation

## 2022-05-09 DIAGNOSIS — Z923 Personal history of irradiation: Secondary | ICD-10-CM | POA: Insufficient documentation

## 2022-05-09 DIAGNOSIS — M1711 Unilateral primary osteoarthritis, right knee: Secondary | ICD-10-CM | POA: Diagnosis not present

## 2022-05-09 DIAGNOSIS — M810 Age-related osteoporosis without current pathological fracture: Secondary | ICD-10-CM | POA: Insufficient documentation

## 2022-05-09 DIAGNOSIS — F419 Anxiety disorder, unspecified: Secondary | ICD-10-CM | POA: Diagnosis not present

## 2022-05-09 DIAGNOSIS — R69 Illness, unspecified: Secondary | ICD-10-CM | POA: Diagnosis not present

## 2022-05-09 LAB — CBC WITH DIFFERENTIAL (CANCER CENTER ONLY)
Abs Immature Granulocytes: 0.03 10*3/uL (ref 0.00–0.07)
Basophils Absolute: 0 10*3/uL (ref 0.0–0.1)
Basophils Relative: 1 %
Eosinophils Absolute: 0.1 10*3/uL (ref 0.0–0.5)
Eosinophils Relative: 1 %
HCT: 41.6 % (ref 36.0–46.0)
Hemoglobin: 14.1 g/dL (ref 12.0–15.0)
Immature Granulocytes: 0 %
Lymphocytes Relative: 25 %
Lymphs Abs: 2.2 10*3/uL (ref 0.7–4.0)
MCH: 32.9 pg (ref 26.0–34.0)
MCHC: 33.9 g/dL (ref 30.0–36.0)
MCV: 97 fL (ref 80.0–100.0)
Monocytes Absolute: 0.5 10*3/uL (ref 0.1–1.0)
Monocytes Relative: 5 %
Neutro Abs: 5.9 10*3/uL (ref 1.7–7.7)
Neutrophils Relative %: 68 %
Platelet Count: 324 10*3/uL (ref 150–400)
RBC: 4.29 MIL/uL (ref 3.87–5.11)
RDW: 13.7 % (ref 11.5–15.5)
WBC Count: 8.7 10*3/uL (ref 4.0–10.5)
nRBC: 0 % (ref 0.0–0.2)

## 2022-05-09 LAB — CMP (CANCER CENTER ONLY)
ALT: 23 U/L (ref 0–44)
AST: 28 U/L (ref 15–41)
Albumin: 4.2 g/dL (ref 3.5–5.0)
Alkaline Phosphatase: 87 U/L (ref 38–126)
Anion gap: 13 (ref 5–15)
BUN: 14 mg/dL (ref 8–23)
CO2: 21 mmol/L — ABNORMAL LOW (ref 22–32)
Calcium: 9.6 mg/dL (ref 8.9–10.3)
Chloride: 108 mmol/L (ref 98–111)
Creatinine: 0.83 mg/dL (ref 0.44–1.00)
GFR, Estimated: >60 mL/min (ref >60)
Glucose, Bld: 93 mg/dL (ref 70–99)
Potassium: 3.7 mmol/L (ref 3.5–5.1)
Sodium: 142 mmol/L (ref 135–145)
Total Bilirubin: 0.6 mg/dL (ref 0.3–1.2)
Total Protein: 7.9 g/dL (ref 6.5–8.1)

## 2022-05-09 MED ORDER — SODIUM CHLORIDE 0.9 % IV SOLN: Freq: Once | INTRAVENOUS | Status: AC

## 2022-05-09 MED ORDER — ZOLEDRONIC ACID 4 MG/100ML IV SOLN
4.0000 mg | Freq: Once | INTRAVENOUS | Status: AC
  Administered 2022-05-09: 4 mg via INTRAVENOUS
  Filled 2022-05-09: qty 100

## 2022-05-10 ENCOUNTER — Telehealth: Payer: Self-pay | Admitting: Hematology

## 2022-05-10 NOTE — Telephone Encounter (Signed)
Called patient and attempted to leave voicemail with new appointment information but voicemail was cut off. Mailing calendar to patient.

## 2022-05-11 DIAGNOSIS — H524 Presbyopia: Secondary | ICD-10-CM | POA: Diagnosis not present

## 2022-05-14 DIAGNOSIS — Z0100 Encounter for examination of eyes and vision without abnormal findings: Secondary | ICD-10-CM | POA: Diagnosis not present

## 2022-05-23 ENCOUNTER — Ambulatory Visit: Payer: Medicare HMO | Admitting: Nurse Practitioner

## 2022-05-23 ENCOUNTER — Encounter: Payer: Self-pay | Admitting: Nurse Practitioner

## 2022-05-23 VITALS — BP 132/80 | HR 78 | Ht 65.0 in | Wt 183.0 lb

## 2022-05-23 DIAGNOSIS — J069 Acute upper respiratory infection, unspecified: Secondary | ICD-10-CM

## 2022-05-23 DIAGNOSIS — J4541 Moderate persistent asthma with (acute) exacerbation: Secondary | ICD-10-CM

## 2022-05-23 DIAGNOSIS — J4540 Moderate persistent asthma, uncomplicated: Secondary | ICD-10-CM | POA: Diagnosis not present

## 2022-05-23 LAB — POCT EXHALED NITRIC OXIDE: FeNO level (ppb): 31

## 2022-05-23 MED ORDER — DOXYCYCLINE HYCLATE 100 MG PO TABS: 100.0000 mg | ORAL_TABLET | Freq: Two times a day (BID) | ORAL | 0 refills | Status: DC

## 2022-05-23 MED ORDER — HYDROCODONE BIT-HOMATROP MBR 5-1.5 MG/5ML PO SOLN: 5.0000 mL | Freq: Four times a day (QID) | ORAL | 0 refills | Status: DC | PRN

## 2022-05-23 MED ORDER — METHYLPREDNISOLONE ACETATE 80 MG/ML IJ SUSP
80.0000 mg | Freq: Once | INTRAMUSCULAR | Status: AC
  Administered 2022-05-23: 80 mg via INTRAMUSCULAR

## 2022-05-23 MED ORDER — PREDNISONE 10 MG PO TABS: ORAL_TABLET | ORAL | 0 refills | Status: DC

## 2022-05-23 NOTE — Assessment & Plan Note (Signed)
Moderate asthmatic bronchitis with elevated exhaled nitric oxide, secondary to URI. Depo 80 mg inj x 1 in office. We will treat her with prednisone taper and empiric doxycycline. Encouraged to restart cough control measures. She will continue high dose ICS/LABA. Asthma action plan in place.   Patient Instructions  Continue Advair 2 puffs Twice daily. Brush tongue and rinse mouth afterwards Continue Albuterol inhaler 2 puffs every 6 hours as needed for shortness of breath or wheezing. Notify if symptoms persist despite rescue inhaler/neb use. Continue Zyrtec 1 tab daily  Continue Mucinex Twice daily until cough improves  Continue Chlorpheniramine tab 4 mg over the counter every 6 hours for cough/drainage; may cause drowsiness Continue Flonase nasal sprays 2 sprays each nostril daily until symptoms better  Continue Astelin nasal spray 2 sprays each nostril Twice daily  Continue Saline nasal irrigation 1-2 times a day   Prednisone taper. 4 tabs for 2 days, then 3 tabs for 2 days, 2 tabs for 2 days, then 1 tab for 2 days, then stop. Start tomorrow. Take in AM with food. Doxycycline 1 tab Twice daily for 7 days. Take with food. Wear sunscreen while on your antibitics  Hycodan cough syrup 5 mL At bedtime; if other cough control measures are not helping, you can use the hycodan every 8 hours. May cause drowsiness. Do not drive after taking Restart Tessalon perles (benzonatate) 1 capsule Three times a day for cough   Follow up in 2 weeks with Dr. Erin Fulling or Katie Sergio Zawislak,NP. If symptoms do not improve or worsen, please contact office for sooner follow up or seek emergency care.

## 2022-05-23 NOTE — Patient Instructions (Addendum)
Continue Advair 2 puffs Twice daily. Brush tongue and rinse mouth afterwards Continue Albuterol inhaler 2 puffs every 6 hours as needed for shortness of breath or wheezing. Notify if symptoms persist despite rescue inhaler/neb use. Continue Zyrtec 1 tab daily  Continue Mucinex Twice daily until cough improves  Continue Chlorpheniramine tab 4 mg over the counter every 6 hours for cough/drainage; may cause drowsiness Continue Flonase nasal sprays 2 sprays each nostril daily until symptoms better  Continue Astelin nasal spray 2 sprays each nostril Twice daily  Continue Saline nasal irrigation 1-2 times a day   Prednisone taper. 4 tabs for 2 days, then 3 tabs for 2 days, 2 tabs for 2 days, then 1 tab for 2 days, then stop. Start tomorrow. Take in AM with food. Doxycycline 1 tab Twice daily for 7 days. Take with food. Wear sunscreen while on your antibitics  Hycodan cough syrup 5 mL At bedtime; if other cough control measures are not helping, you can use the hycodan every 8 hours. May cause drowsiness. Do not drive after taking Restart Tessalon perles (benzonatate) 1 capsule Three times a day for cough   Follow up in 2 weeks with Dr. Erin Fulling or Katie Edmund Holcomb,NP. If symptoms do not improve or worsen, please contact office for sooner follow up or seek emergency care.

## 2022-05-23 NOTE — Assessment & Plan Note (Signed)
See above plan. 

## 2022-05-23 NOTE — Progress Notes (Signed)
$'@Patient'Q$  ID: Katherine Hanna, female    DOB: 02/28/1956, 67 y.o.   MRN: [id number]  Chief Complaint  Patient presents with   Follow-up    Pt f/u she is currently experiencing a cough and low grade fever since 12/25, reports getting yellow mucus up in morning. Denies COVID testing.    Referring provider: Ann Held, *  HPI: 67 year old female, never smoker followed for mild reactive airway disease. She is a patient of Dr. August Albino and last seen in office on 04/11/2022 by [facility] NP. Past medical history significant for migraines, allergic rhinitis, depression, stage IA breast cancer.   TEST/EVENTS:  04/19/2021 CT chest wo contrast: atherosclerosis. Interstitial accentuation anteriorly in the right lung which extends up to the level of the lung apex and likely contributes to prior radiation. Mild airway plugging.  01/16/2022 PFT: FVC 80, FEV1 86, ratio 84, TLC 83, DLCOcor 105 02/21/2022: clear lungs  01/16/2022: OV with Dr. Erin Fulling. Reactive airway disease r/t upper respiratory infections. PFTs nl. She had good response to Advair previously. Plans to continue it PRN with URI's and during allergy season.   02/21/2022: OV with Eldonna Neuenfeldt NP for acute visit. Her symptoms initially started the beginning of September. She saw her PCP on 9/19 and was treated for bronchitis with doxycycline and prednisone taper. CXR at the time was clear. COVID was negative. Unfortunately, her symptoms never resolved. Unresolved acute exacerbation with elevated exhaled nitric oxide, likely brought on by URI. She had some improvement with previous doxycycline and prednisone courses but has worsened since completing these. CXR today without evidence of superimposed infection; hold off on further abx. We will treat her with depo inj 80 mg x 1 and prednisone taper. Target cough control measures. She doesn't appear to have any significant URI or GERD symptoms contributing to her cough. Continue ICS/LABA therapy and prn  albuterol.  03/07/2022: OV with Yaviel Kloster NP for intended follow up; unfortunately, she is still experiencing persistent cough. She was doing better after the depo injection and on higher doses of prednisone. Once she tapered down to 10 mg then off the steroids, her cough became more violent and paroxysmal. It's primarily non-productive now but sounds congested. She notices an occasional wheeze. She feels like she can tell when her advair starts to wear off because her cough flares up. No worse at night. She feels short of breath with coughing spells; otherwise breathing is at her baseline. She denies fevers, chills, night sweats, lassitude, anorexia, hemoptysis, leg swelling. No significant nasal drainage or GERD symptoms. Does occasionally feel like she has some post nasal drip. She is on Advair 115; uses it twice a day. Takes zyrtec for allergies. She ran out of the tessalon; did feel like they helped. She only takes the hycodan syrup when she can't sleep due to the cough. FeNO was elevated to 39 ppb. Increased Advair to high dose ICS. Treated with extended prednisone taper and z pack. Advised she start flonase for postnasal drainage.   04/04/2022: OV with Arshawn Valdez NP for follow up. She was doing better after our last course of steroids and antibiotics. Cough didn't entirely resolve but was much less severe. She went to [place] last week to help her daughter move; she returned on Sunday and had worsening nasal congestion with clear drainage, headaches, and her cough had worsened. She's getting up yellow phlegm, especially in the mornings. Hurts to cough. She also feels slightly more short of breath, mainly with coughing spells. She denies  any fevers, chills, hemoptysis, night sweats, leg swelling, orthopnea. She is eating and drinking well. No N/V. No know sick exposures. She's using the higher dosed Advair, prescribed at her last visit. She did feel like this was helping. She's taking zyrtec daily and flonase nasal  spray still. She tired the tessalon perles but they didn't touch her cough. Started using chlortab again, every 6 hours. She does use the hycodan cough syrup at night, which has allowed her to sleep. CXR was clear. COVID negative. FeNO improved. Treated with supportive care and cough control measures.   04/11/2022: OV with Sanaai Doane NP for follow-up.  After she was seen in the office, she contacted 11/15 and reported that she had developed a fever up to 101.2.  She was advised to continue with supportive care measures as symptoms are likely viral in nature.  She then called back and on 11/16 with worsening sinus symptoms.  She was having a lot of sinus pressure and right ear pressure.  She also had purulent nasal drainage.  She was treated with 10-day course of doxycycline for bacterial rhinosinusitis. Today, she tells me that she is feeling much better.  Feels like the pressure has started to lessen and she is not coughing nearly as frequently as she was.  Breathing is at her baseline.  She has not had any recurrent fevers.  Still has some nasal drainage which she is using her nasal sprays and saline rinses for.  Denies any chills, ear pain/drainage, sore throat, wheezing, increased chest congestion.  She is using her Advair twice daily.  Has not had to use take it in over the last few days.  Still taking Mucinex twice daily, Flonase, Astelin and doing saline nasal irrigations.  05/23/2022: Today - follow up Patient presents today for intended follow up but she is also having some acute symptoms.  She tells me that she got sick shortly after Christmas.  She started having chest congestion and a cough.  She is producing some yellow phlegm.  She does notice that she has been wheezing more.  Does not feel like she has any nasal congestion or drainage.  Denies any fevers >100.4, chills, hemoptysis, increased shortness of breath.  She did not complete any viral testing at the time. She is using her Advair on a consistent  basis.   FeNO 31 ppb  Allergies  Allergen Reactions   Penicillins Rash    Rash head to toe    Immunization History  Administered Date(s) Administered   Fluad Quad(high Dose 65+) 03/28/2019, 02/03/2022   Hep A / Hep B 03/15/2010, 07/25/2010   Influenza Whole 02/19/2013   Influenza, High Dose Seasonal PF 03/05/2020   Influenza,inj,Quad PF,6+ Mos 03/13/2014   Influenza-Unspecified 03/23/2015, 03/28/2017, 03/14/2018   Moderna Covid-19 Vaccine Bivalent Booster 73yr & up 03/09/2021   Moderna Sars-Covid-2 Vaccination 07/04/2019, 07/28/2019, 03/19/2020, 12/10/2020   PNEUMOCOCCAL CONJUGATE-20 09/06/2021   Pneumococcal Conjugate-13 11/21/2019   Pneumococcal Polysaccharide-23 06/18/2015   Tdap 09/02/2006, 07/30/2017   Zoster Recombinat (Shingrix) 08/02/2018, 12/09/2018   Zoster, Live 04/28/2014    Past Medical History:  Diagnosis Date   Allergic rhinitis    Arthritis    knee   Asthma    [place]    History of chicken pox    Migraine    Rheumatic fever    as a child.    Tobacco History: Social History   Tobacco Use  Smoking Status Never  Smokeless Tobacco Never   Counseling given: Not Answered  Outpatient Medications Prior to Visit  Medication Sig Dispense Refill   albuterol (VENTOLIN HFA) 108 (90 Base) MCG/ACT inhaler Inhale 2 puffs into the lungs every 6 (six) hours as needed for wheezing or shortness of breath. 8 g 0   anastrozole (ARIMIDEX) 1 MG tablet Take 1 tablet (1 mg total) by mouth daily. 90 tablet 3   benzonatate (TESSALON) 200 MG capsule Take 1 capsule (200 mg total) by mouth 3 (three) times daily as needed for cough. 30 capsule 1   Calcium Carbonate-Vitamin D (CALCIUM 500 + D PO) Take 1 tablet by mouth 2 (two) times daily.     cetirizine (ZYRTEC) 10 MG tablet Take 10 mg by mouth at bedtime.     co-enzyme Q-10 30 MG capsule Take 30 mg by mouth daily.     fluticasone (FLONASE) 50 MCG/ACT nasal spray Place 2 sprays into both nostrils daily. 18.2  mL 2   fluticasone-salmeterol (ADVAIR HFA) 230-21 MCG/ACT inhaler Inhale 2 puffs into the lungs 2 (two) times daily. 1 each 5   Misc Natural Products (TART CHERRY ADVANCED PO) Take by mouth.     Multiple Vitamin (MULTIVITAMIN) tablet Take 1 tablet by mouth daily.     TURMERIC PO Take by mouth.     HYDROcodone bit-homatropine (HYCODAN) 5-1.5 MG/5ML syrup Take 5 mLs by mouth every 8 (eight) hours as needed for cough. 120 mL 0   No facility-administered medications prior to visit.     Review of Systems:   Constitutional: No weight loss or gain, night sweats, fevers, chills, fatigue, or lassitude. HEENT: No headaches, difficulty swallowing, tooth/dental problems, or sore throat. No sneezing, itching, ear ache, nasal congestion, sinus pressure  CV:  No chest pain, orthopnea, PND, swelling in lower extremities, anasarca, dizziness, palpitations, syncope Resp: +cough; chest congestion; wheezing. No shortness of breath with exertion or at rest. No hemoptysis. No chest wall deformity GI:  No heartburn, indigestion, nausea, vomiting, diarrhea MSK:  No joint pain or swelling.  No decreased range of motion.  No back pain. Neuro: No dizziness or lightheadedness.  Psych: No depression or anxiety. Mood stable.     Physical Exam:  BP 132/80   Pulse 78   Ht '5\' 5"'$  (1.651 m)   Wt 183 lb (83 kg)   SpO2 100%   BMI 30.45 kg/m   GEN: Pleasant, interactive, well-appearing; obese; in no acute distress. HEENT:  Normocephalic and atraumatic. PERRLA. Sclera white. Nasal turbinates pink, moist and patent bilaterally. No rhinorrhea present. Oropharynx pink and moist, without exudate or edema. No lesions, ulcerations NECK:  Supple w/ fair ROM. No JVD present.  No lymphadenopathy.   CV: RRR, no m/r/g, no peripheral edema. Pulses intact, +2 bilaterally. No cyanosis, pallor or clubbing. PULMONARY:  Unlabored, regular breathing. Minimal end expiratory wheeze bases otherwise clear. No accessory muscle use.  GI:  BS present and normoactive. Soft, non-tender to palpation. No organomegaly or masses detected.  MSK: No erythema, warmth or tenderness. No deformities or joint swelling noted.  Neuro: A/Ox3. No focal deficits noted.   Skin: Warm, no lesions or rashe Psych: Normal affect and behavior. Judgement and thought content appropriate.     Lab Results:  CBC    Component Value Date/Time   WBC 8.7 05/09/2022 0840   WBC 6.7 09/06/2021 0838   RBC 4.29 05/09/2022 0840   HGB 14.1 05/09/2022 0840   HCT 41.6 05/09/2022 0840   PLT 324 05/09/2022 0840   MCV 97.0 05/09/2022 0840   MCH 32.9 05/09/2022 0840  MCHC 33.9 05/09/2022 0840   RDW 13.7 05/09/2022 0840   LYMPHSABS 2.2 05/09/2022 0840   MONOABS 0.5 05/09/2022 0840   EOSABS 0.1 05/09/2022 0840   BASOSABS 0.0 05/09/2022 0840    BMET    Component Value Date/Time   NA 142 05/09/2022 0840   K 3.7 05/09/2022 0840   CL 108 05/09/2022 0840   CO2 21 (L) 05/09/2022 0840   GLUCOSE 93 05/09/2022 0840   BUN 14 05/09/2022 0840   CREATININE 0.83 05/09/2022 0840   CALCIUM 9.6 05/09/2022 0840   GFRNONAA >60 05/09/2022 0840   GFRAA >60 12/10/2019 1225    BNP No results found for: "BNP"   Imaging:  No results found.  methylPREDNISolone acetate (DEPO-MEDROL) injection 80 mg     Date Action Dose Route User   05/23/2022 0914 Given 80 mg Intramuscular (Left Ventrogluteal) Mellody Life E, CMA      0.9 %  sodium chloride infusion     Date Action Dose Route User   05/09/2022 1103 Rate/Dose Change (none) Intravenous Ishmael Holter, RN   05/09/2022 1058 Infusion Verify (none) Intravenous Ishmael Holter, RN   05/09/2022 1056 Rate/Dose Change (none) Intravenous Ishmael Holter, RN   05/09/2022 1056 Restarted (none) Intravenous Ishmael Holter, RN   05/09/2022 1032 New Bag/Given (none) Intravenous Ishmael Holter, RN      Zoledronic Acid (ZOMETA) IVPB 4 mg     Date Action Dose Route User   05/09/2022 1041 New Bag/Given 4 mg  Intravenous Ishmael Holter, RN          Latest Ref Rng & Units 01/16/2022    8:29 AM  PFT Results  FVC-Pre L 2.60   FVC-Predicted Pre % 80   FVC-Post L 2.57   FVC-Predicted Post % 79   Pre FEV1/FVC % % 82   Post FEV1/FCV % % 84   FEV1-Pre L 2.12   FEV1-Predicted Pre % 86   FEV1-Post L 2.15   DLCO uncorrected ml/min/mmHg 21.53   DLCO UNC% % 105   DLCO corrected ml/min/mmHg 21.53   DLCO COR %Predicted % 105   DLVA Predicted % 123   TLC L 4.36   TLC % Predicted % 83   RV % Predicted % 68     Lab Results  Component Value Date   NITRICOXIDE 18 04/04/2022        Assessment & Plan:   Moderate persistent asthmatic bronchitis with exacerbation Moderate asthmatic bronchitis with elevated exhaled nitric oxide, secondary to URI. Depo 80 mg inj x 1 in office. We will treat her with prednisone taper and empiric doxycycline. Encouraged to restart cough control measures. She will continue high dose ICS/LABA. Asthma action plan in place.   Patient Instructions  Continue Advair 2 puffs Twice daily. Brush tongue and rinse mouth afterwards Continue Albuterol inhaler 2 puffs every 6 hours as needed for shortness of breath or wheezing. Notify if symptoms persist despite rescue inhaler/neb use. Continue Zyrtec 1 tab daily  Continue Mucinex Twice daily until cough improves  Continue Chlorpheniramine tab 4 mg over the counter every 6 hours for cough/drainage; may cause drowsiness Continue Flonase nasal sprays 2 sprays each nostril daily until symptoms better  Continue Astelin nasal spray 2 sprays each nostril Twice daily  Continue Saline nasal irrigation 1-2 times a day   Prednisone taper. 4 tabs for 2 days, then 3 tabs for 2 days, 2 tabs for 2 days, then 1 tab for 2 days, then stop. Start tomorrow.  Take in AM with food. Doxycycline 1 tab Twice daily for 7 days. Take with food. Wear sunscreen while on your antibitics  Hycodan cough syrup 5 mL At bedtime; if other cough control measures  are not helping, you can use the hycodan every 8 hours. May cause drowsiness. Do not drive after taking Restart Tessalon perles (benzonatate) 1 capsule Three times a day for cough   Follow up in 2 weeks with Dr. Erin Fulling or Katie Shahil Speegle,NP. If symptoms do not improve or worsen, please contact office for sooner follow up or seek emergency care.    URI (upper respiratory infection) See above plan.   I spent 35 minutes of dedicated to the care of this patient on the date of this encounter to include pre-visit review of records, face-to-face time with the patient discussing conditions above, post visit ordering of testing, clinical documentation with the electronic health record, making appropriate referrals as documented, and communicating necessary findings to members of the patients care team.  Clayton Bibles, NP 05/23/2022  Pt aware and understands NP's role.

## 2022-06-07 ENCOUNTER — Ambulatory Visit: Payer: Medicare HMO | Admitting: Nurse Practitioner

## 2022-06-07 ENCOUNTER — Encounter: Payer: Self-pay | Admitting: Nurse Practitioner

## 2022-06-07 VITALS — BP 112/72 | HR 81 | Ht 65.0 in | Wt 186.2 lb

## 2022-06-07 DIAGNOSIS — J4540 Moderate persistent asthma, uncomplicated: Secondary | ICD-10-CM | POA: Diagnosis not present

## 2022-06-07 DIAGNOSIS — J3089 Other allergic rhinitis: Secondary | ICD-10-CM

## 2022-06-07 DIAGNOSIS — J4541 Moderate persistent asthma with (acute) exacerbation: Secondary | ICD-10-CM

## 2022-06-07 LAB — POCT EXHALED NITRIC OXIDE: FeNO level (ppb): 24

## 2022-06-07 MED ORDER — PREDNISONE 10 MG PO TABS: ORAL_TABLET | ORAL | 0 refills | Status: DC

## 2022-06-07 MED ORDER — PREDNISONE 20 MG PO TABS: 20.0000 mg | ORAL_TABLET | Freq: Every day | ORAL | 0 refills | Status: DC

## 2022-06-07 NOTE — Patient Instructions (Addendum)
Continue Advair 2 puffs Twice daily. Brush tongue and rinse mouth afterwards Continue Albuterol inhaler 2 puffs every 6 hours as needed for shortness of breath or wheezing. Notify if symptoms persist despite rescue inhaler/neb use. Continue Zyrtec 1 tab daily  Continue Mucinex Twice daily until cough improves  Continue Chlorpheniramine tab 4 mg over the counter every 6 hours for cough/drainage; may cause drowsiness Continue Flonase nasal sprays 2 sprays each nostril daily until symptoms better  Continue Astelin nasal spray 2 sprays each nostril Twice daily  Continue Saline nasal irrigation 1-2 times a day    Extend prednisone 2 tablets for 5 days then 1 tablet for 5 days. Then stop. Take in AM with food Continue Hycodan cough syrup 5 mL At bedtime; if other cough control measures are not helping, you can use the hycodan every 8 hours. May cause drowsiness. Do not drive after taking Continue Tessalon perles (benzonatate) 1 capsule Three times a day for cough   Follow up in 3 months with Dr. Erin Fulling or Alanson Aly. If symptoms do not improve or worsen, please contact office for sooner follow up or seek emergency care.

## 2022-06-07 NOTE — Assessment & Plan Note (Signed)
Stable on current regimen   

## 2022-06-07 NOTE — Assessment & Plan Note (Addendum)
Slow to resolve asthmatic bronchitis secondary to URI. Exhaled nitric oxide borderline today. We will extend her prednisone. She is maintained on high dose ICS/LABA. Add on singulair for trigger prevention. May need to consider biologic therapy in the future. Asthma action plan in place.  Patient Instructions  Continue Advair 2 puffs Twice daily. Brush tongue and rinse mouth afterwards Continue Albuterol inhaler 2 puffs every 6 hours as needed for shortness of breath or wheezing. Notify if symptoms persist despite rescue inhaler/neb use. Continue Zyrtec 1 tab daily  Continue Mucinex Twice daily until cough improves  Continue Chlorpheniramine tab 4 mg over the counter every 6 hours for cough/drainage; may cause drowsiness Continue Flonase nasal sprays 2 sprays each nostril daily until symptoms better  Continue Astelin nasal spray 2 sprays each nostril Twice daily  Continue Saline nasal irrigation 1-2 times a day    Extend prednisone 2 tablets for 5 days then 1 tablet for 5 days. Then stop. Take in AM with food Continue Hycodan cough syrup 5 mL At bedtime; if other cough control measures are not helping, you can use the hycodan every 8 hours. May cause drowsiness. Do not drive after taking Continue Tessalon perles (benzonatate) 1 capsule Three times a day for cough   Follow up in 3 months with Dr. Erin Fulling or Alanson Aly. If symptoms do not improve or worsen, please contact office for sooner follow up or seek emergency care.

## 2022-06-07 NOTE — Progress Notes (Signed)
$'@Patient'R$  ID: Katherine Hanna, female    DOB: 27-Jun-1955, 67 y.o.   MRN: [id number]  Chief Complaint  Patient presents with   Follow-up    Pt f/u she is feeling better, has a slight cough but states that is typical for her. She reports she has finished antibiotics & steroids from [facility]    Referring provider: Carollee Herter, Alferd Apa, *  HPI: 67 year old female, never smoker followed for mild reactive airway disease. She is a patient of Dr. August Albino and last seen in office on 05/23/2022 by [facility] NP. Past medical history significant for migraines, allergic rhinitis, depression, stage IA breast cancer.   TEST/EVENTS:  04/19/2021 CT chest wo contrast: atherosclerosis. Interstitial accentuation anteriorly in the right lung which extends up to the level of the lung apex and likely contributes to prior radiation. Mild airway plugging.  01/16/2022 PFT: FVC 80, FEV1 86, ratio 84, TLC 83, DLCOcor 105 02/21/2022: clear lungs 04/04/2022 CXR: clear lungs 05/23/2022 FeNO 31 ppb  01/16/2022: OV with Dr. Erin Fulling. Reactive airway disease r/t upper respiratory infections. PFTs nl. She had good response to Advair previously. Plans to continue it PRN with URI's and during allergy season.   02/21/2022: OV with Patt Steinhardt NP for acute visit. Her symptoms initially started the beginning of September. She saw her PCP on 9/19 and was treated for bronchitis with doxycycline and prednisone taper. CXR at the time was clear. COVID was negative. Unfortunately, her symptoms never resolved. Unresolved acute exacerbation with elevated exhaled nitric oxide, likely brought on by URI. She had some improvement with previous doxycycline and prednisone courses but has worsened since completing these. CXR today without evidence of superimposed infection; hold off on further abx. We will treat her with depo inj 80 mg x 1 and prednisone taper. Target cough control measures. She doesn't appear to have any significant URI or GERD symptoms contributing to her  cough. Continue ICS/LABA therapy and prn albuterol.  03/07/2022: OV with Jahyra Sukup NP for intended follow up; unfortunately, she is still experiencing persistent cough. She was doing better after the depo injection and on higher doses of prednisone. Once she tapered down to 10 mg then off the steroids, her cough became more violent and paroxysmal. It's primarily non-productive now but sounds congested. She notices an occasional wheeze. She feels like she can tell when her advair starts to wear off because her cough flares up. No worse at night. She feels short of breath with coughing spells; otherwise breathing is at her baseline. She denies fevers, chills, night sweats, lassitude, anorexia, hemoptysis, leg swelling. No significant nasal drainage or GERD symptoms. Does occasionally feel like she has some post nasal drip. She is on Advair 115; uses it twice a day. Takes zyrtec for allergies. She ran out of the tessalon; did feel like they helped. She only takes the hycodan syrup when she can't sleep due to the cough. FeNO was elevated to 39 ppb. Increased Advair to high dose ICS. Treated with extended prednisone taper and z pack. Advised she start flonase for postnasal drainage.   04/04/2022: OV with Temima Kutsch NP for follow up. She was doing better after our last course of steroids and antibiotics. Cough didn't entirely resolve but was much less severe. She went to [place] last week to help her daughter move; she returned on Sunday and had worsening nasal congestion with clear drainage, headaches, and her cough had worsened. She's getting up yellow phlegm, especially in the mornings. Hurts to cough. She also feels  slightly more short of breath, mainly with coughing spells. She denies any fevers, chills, hemoptysis, night sweats, leg swelling, orthopnea. She is eating and drinking well. No N/V. No know sick exposures. She's using the higher dosed Advair, prescribed at her last visit. She did feel like this was helping.  She's taking zyrtec daily and flonase nasal spray still. She tired the tessalon perles but they didn't touch her cough. Started using chlortab again, every 6 hours. She does use the hycodan cough syrup at night, which has allowed her to sleep. CXR was clear. COVID negative. FeNO improved. Treated with supportive care and cough control measures.   04/11/2022: OV with Ashten Prats NP for follow-up.  After she was seen in the office, she contacted 11/15 and reported that she had developed a fever up to 101.2.  She was advised to continue with supportive care measures as symptoms are likely viral in nature.  She then called back and on 11/16 with worsening sinus symptoms.  She was having a lot of sinus pressure and right ear pressure.  She also had purulent nasal drainage.  She was treated with 10-day course of doxycycline for bacterial rhinosinusitis. Today, she tells me that she is feeling much better.  Feels like the pressure has started to lessen and she is not coughing nearly as frequently as she was.  Breathing is at her baseline.  She has not had any recurrent fevers.  Still has some nasal drainage which she is using her nasal sprays and saline rinses for.  Denies any chills, ear pain/drainage, sore throat, wheezing, increased chest congestion.  She is using her Advair twice daily.  Has not had to use take it in over the last few days.  Still taking Mucinex twice daily, Flonase, Astelin and doing saline nasal irrigations.  05/23/2022: OV with Chasyn Cinque NP for intended follow up but she is also having some acute symptoms.  She tells me that she got sick shortly after Christmas.  She started having chest congestion and a cough.  She is producing some yellow phlegm.  She does notice that she has been wheezing more.  Does not feel like she has any nasal congestion or drainage.  Denies any fevers >100.4, chills, hemoptysis, increased shortness of breath.  She did not complete any viral testing at the time. She is using her  Advair on a consistent basis. FeNO 31 ppb.  06/07/2022: Today - follow up Patient presents today for follow up after being treated for asthmatic bronchitis secondary to URI. She is feeling much better today but still has a residual cough. Mostly dry throughout the day but produces a small amount of clear sputum in the morning. Feels like her chest congestion has resolved. Breathing is stable. No shortness of breath or wheezing. Denies fevers or chills, hemoptysis. She is using her Advair consistently. Completed doxycycline and prednisone.   FeNO 24 ppb   Allergies  Allergen Reactions   Penicillins Rash    Rash head to toe    Immunization History  Administered Date(s) Administered   Fluad Quad(high Dose 65+) 03/28/2019, 02/03/2022   Hep A / Hep B 03/15/2010, 07/25/2010   Influenza Whole 02/19/2013   Influenza, High Dose Seasonal PF 03/05/2020   Influenza,inj,Quad PF,6+ Mos 03/13/2014   Influenza-Unspecified 03/23/2015, 03/28/2017, 03/14/2018   Moderna Covid-19 Vaccine Bivalent Booster 74yr & up 03/09/2021   Moderna Sars-Covid-2 Vaccination 07/04/2019, 07/28/2019, 03/19/2020, 12/10/2020   PNEUMOCOCCAL CONJUGATE-20 09/06/2021   Pneumococcal Conjugate-13 11/21/2019   Pneumococcal Polysaccharide-23 06/18/2015  Tdap 09/02/2006, 07/30/2017   Zoster Recombinat (Shingrix) 08/02/2018, 12/09/2018   Zoster, Live 04/28/2014    Past Medical History:  Diagnosis Date   Allergic rhinitis    Arthritis    knee   Asthma    Breast cancer ([place])    History of chicken pox    Migraine    Rheumatic fever    as a child.    Tobacco History: Social History   Tobacco Use  Smoking Status Never  Smokeless Tobacco Never   Counseling given: Not Answered   Outpatient Medications Prior to Visit  Medication Sig Dispense Refill   albuterol (VENTOLIN HFA) 108 (90 Base) MCG/ACT inhaler Inhale 2 puffs into the lungs every 6 (six) hours as needed for wheezing or shortness of breath. 8 g 0    anastrozole (ARIMIDEX) 1 MG tablet Take 1 tablet (1 mg total) by mouth daily. 90 tablet 3   benzonatate (TESSALON) 200 MG capsule Take 1 capsule (200 mg total) by mouth 3 (three) times daily as needed for cough. 30 capsule 1   Calcium Carbonate-Vitamin D (CALCIUM 500 + D PO) Take 1 tablet by mouth 2 (two) times daily.     cetirizine (ZYRTEC) 10 MG tablet Take 10 mg by mouth at bedtime.     co-enzyme Q-10 30 MG capsule Take 30 mg by mouth daily.     fluticasone (FLONASE) 50 MCG/ACT nasal spray Place 2 sprays into both nostrils daily. 18.2 mL 2   fluticasone-salmeterol (ADVAIR HFA) 230-21 MCG/ACT inhaler Inhale 2 puffs into the lungs 2 (two) times daily. 1 each 5   HYDROcodone bit-homatropine (HYCODAN) 5-1.5 MG/5ML syrup Take 5 mLs by mouth every 6 (six) hours as needed for cough. 240 mL 0   Misc Natural Products (TART CHERRY ADVANCED PO) Take by mouth.     Multiple Vitamin (MULTIVITAMIN) tablet Take 1 tablet by mouth daily.     TURMERIC PO Take by mouth.     doxycycline (VIBRA-TABS) 100 MG tablet Take 1 tablet (100 mg total) by mouth 2 (two) times daily. (Patient not taking: Reported on 06/07/2022) 14 tablet 0   predniSONE (DELTASONE) 10 MG tablet 4 tabs for 2 days, then 3 tabs for 2 days, 2 tabs for 2 days, then 1 tab for 2 days, then stop 20 tablet 0   No facility-administered medications prior to visit.     Review of Systems:   Constitutional: No weight loss or gain, night sweats, fevers, chills, fatigue, or lassitude. HEENT: No headaches, difficulty swallowing, tooth/dental problems, or sore throat. No sneezing, itching, ear ache, nasal congestion, sinus pressure  CV:  No chest pain, orthopnea, PND, swelling in lower extremities, anasarca, dizziness, palpitations, syncope Resp: +persistent cough. No wheezing. No shortness of breath with exertion or at rest. No hemoptysis. No chest wall deformity GI:  No heartburn, indigestion, nausea, vomiting, diarrhea MSK:  No joint pain or swelling.  No  decreased range of motion.  No back pain. Neuro: No dizziness or lightheadedness.  Psych: No depression or anxiety. Mood stable.     Physical Exam:  BP 112/72   Pulse 81   Ht '5\' 5"'$  (1.651 m)   Wt 186 lb 3.2 oz (84.5 kg)   SpO2 98%   BMI 30.99 kg/m   GEN: Pleasant, interactive, well-appearing; obese; in no acute distress. HEENT:  Normocephalic and atraumatic. PERRLA. Sclera white. Nasal turbinates pink, moist and patent bilaterally. No rhinorrhea present. Oropharynx pink and moist, without exudate or edema. No lesions, ulcerations NECK:  Supple  w/ fair ROM. No JVD present.  No lymphadenopathy.   CV: RRR, no m/r/g, no peripheral edema. Pulses intact, +2 bilaterally. No cyanosis, pallor or clubbing. PULMONARY:  Unlabored, regular breathing. Minimal end expiratory wheeze bases otherwise clear. No accessory muscle use.  GI: BS present and normoactive. Soft, non-tender to palpation. No organomegaly or masses detected.  MSK: No erythema, warmth or tenderness. No deformities or joint swelling noted.  Neuro: A/Ox3. No focal deficits noted.   Skin: Warm, no lesions or rashe Psych: Normal affect and behavior. Judgement and thought content appropriate.     Lab Results:  CBC    Component Value Date/Time   WBC 8.7 05/09/2022 0840   WBC 6.7 09/06/2021 0838   RBC 4.29 05/09/2022 0840   HGB 14.1 05/09/2022 0840   HCT 41.6 05/09/2022 0840   PLT 324 05/09/2022 0840   MCV 97.0 05/09/2022 0840   MCH 32.9 05/09/2022 0840   MCHC 33.9 05/09/2022 0840   RDW 13.7 05/09/2022 0840   LYMPHSABS 2.2 05/09/2022 0840   MONOABS 0.5 05/09/2022 0840   EOSABS 0.1 05/09/2022 0840   BASOSABS 0.0 05/09/2022 0840    BMET    Component Value Date/Time   NA 142 05/09/2022 0840   K 3.7 05/09/2022 0840   CL 108 05/09/2022 0840   CO2 21 (L) 05/09/2022 0840   GLUCOSE 93 05/09/2022 0840   BUN 14 05/09/2022 0840   CREATININE 0.83 05/09/2022 0840   CALCIUM 9.6 05/09/2022 0840   GFRNONAA >60 05/09/2022 0840    GFRAA >60 12/10/2019 1225    BNP No results found for: "BNP"   Imaging:  No results found.  methylPREDNISolone acetate (DEPO-MEDROL) injection 80 mg     Date Action Dose Route User   05/23/2022 0914 Given 80 mg Intramuscular (Left Ventrogluteal) Mellody Life E, CMA      0.9 %  sodium chloride infusion     Date Action Dose Route User   05/09/2022 1103 Rate/Dose Change (none) Intravenous Ishmael Holter, RN   05/09/2022 1058 Infusion Verify (none) Intravenous Ishmael Holter, RN   05/09/2022 1056 Rate/Dose Change (none) Intravenous Ishmael Holter, RN   05/09/2022 1056 Restarted (none) Intravenous Ishmael Holter, RN   05/09/2022 1032 New Bag/Given (none) Intravenous Ishmael Holter, RN      Zoledronic Acid (ZOMETA) IVPB 4 mg     Date Action Dose Route User   05/09/2022 1041 New Bag/Given 4 mg Intravenous Ishmael Holter, RN          Latest Ref Rng & Units 01/16/2022    8:29 AM  PFT Results  FVC-Pre L 2.60   FVC-Predicted Pre % 80   FVC-Post L 2.57   FVC-Predicted Post % 79   Pre FEV1/FVC % % 82   Post FEV1/FCV % % 84   FEV1-Pre L 2.12   FEV1-Predicted Pre % 86   FEV1-Post L 2.15   DLCO uncorrected ml/min/mmHg 21.53   DLCO UNC% % 105   DLCO corrected ml/min/mmHg 21.53   DLCO COR %Predicted % 105   DLVA Predicted % 123   TLC L 4.36   TLC % Predicted % 83   RV % Predicted % 68     Lab Results  Component Value Date   NITRICOXIDE 18 04/04/2022        Assessment & Plan:   Moderate persistent asthmatic bronchitis with exacerbation Slow to resolve asthmatic bronchitis secondary to URI. Exhaled nitric oxide borderline today. We will extend her prednisone. She  is maintained on high dose ICS/LABA. Add on singulair for trigger prevention. May need to consider biologic therapy in the future. Asthma action plan in place.  Patient Instructions  Continue Advair 2 puffs Twice daily. Brush tongue and rinse mouth afterwards Continue Albuterol inhaler 2  puffs every 6 hours as needed for shortness of breath or wheezing. Notify if symptoms persist despite rescue inhaler/neb use. Continue Zyrtec 1 tab daily  Continue Mucinex Twice daily until cough improves  Continue Chlorpheniramine tab 4 mg over the counter every 6 hours for cough/drainage; may cause drowsiness Continue Flonase nasal sprays 2 sprays each nostril daily until symptoms better  Continue Astelin nasal spray 2 sprays each nostril Twice daily  Continue Saline nasal irrigation 1-2 times a day    Extend prednisone 2 tablets for 5 days then 1 tablet for 5 days. Then stop. Take in AM with food Continue Hycodan cough syrup 5 mL At bedtime; if other cough control measures are not helping, you can use the hycodan every 8 hours. May cause drowsiness. Do not drive after taking Continue Tessalon perles (benzonatate) 1 capsule Three times a day for cough   Follow up in 3 months with Dr. Erin Fulling or Alanson Aly. If symptoms do not improve or worsen, please contact office for sooner follow up or seek emergency care.    Allergic rhinitis Stable on current regimen.   I spent 35 minutes of dedicated to the care of this patient on the date of this encounter to include pre-visit review of records, face-to-face time with the patient discussing conditions above, post visit ordering of testing, clinical documentation with the electronic health record, making appropriate referrals as documented, and communicating necessary findings to members of the patients care team.  Clayton Bibles, NP 06/07/2022  Pt aware and understands NP's role.

## 2022-06-08 ENCOUNTER — Telehealth: Payer: Self-pay | Admitting: Student

## 2022-06-08 NOTE — Telephone Encounter (Signed)
Called and spoke with pt who states that the cost for her Advair inhaler has gone up in cost from $45 to now over $200. Pt wants to know if there is anything that would be less costly for her.  Routing to prior auth team to check on this for pt prior to routing encounter to [facility]. Please advise.

## 2022-06-09 ENCOUNTER — Encounter: Payer: Self-pay | Admitting: Nurse Practitioner

## 2022-06-09 ENCOUNTER — Other Ambulatory Visit (HOSPITAL_COMMUNITY): Payer: Self-pay

## 2022-06-09 NOTE — Telephone Encounter (Signed)
Benefits investigation not able to be completed due to last fill of Advair HFA being on 01/18. Patient may contact the insurance carrier to get a copy of the formulary.

## 2022-06-09 NOTE — Telephone Encounter (Signed)
Called and spoke with pt letting her know the info per pharmacy team and she verbalized understanding. Nothing further needed.

## 2022-06-21 ENCOUNTER — Telehealth: Payer: Self-pay | Admitting: Nurse Practitioner

## 2022-06-21 DIAGNOSIS — J4540 Moderate persistent asthma, uncomplicated: Secondary | ICD-10-CM

## 2022-06-21 NOTE — Telephone Encounter (Signed)
PT calling regarding last encounter that is signed. Has three inhalewrs her Ins. Company:  [facility] Code [id number] (No cost 90 Days)  [employer] Code [id number](TD cost 90 Days)  [place] Code [id number] (141.00 for 90 Day Supply)  [place] [employer]  Pharm: [facility] [place]   Her # is [phone]. Please call PT to advise

## 2022-06-22 ENCOUNTER — Other Ambulatory Visit: Payer: Self-pay | Admitting: Nurse Practitioner

## 2022-06-22 MED ORDER — FLUTICASONE-SALMETEROL 500-50 MCG/ACT IN AEPB: 1.0000 | INHALATION_SPRAY | Freq: Two times a day (BID) | RESPIRATORY_TRACT | 5 refills | Status: DC

## 2022-06-22 NOTE — Telephone Encounter (Signed)
Notified pt that Grant Ruts was called in and reviewed inhaler instructions and education with pt. Pt stated understanding. Nothing further needed.

## 2022-06-22 NOTE — Telephone Encounter (Signed)
I have sent rx for Wixela 500 mcg 1 puff Twice daily. Brush tongue and rinse mouth afterwards. It is a dry powder inhaler, which is different from her current. Please ensure she understands proper use. Pharmacy should be able to help as well. She will use this instead of the high dose Advair. Thanks.

## 2022-06-22 NOTE — Telephone Encounter (Signed)
Pt called back stating insurance would cover Wixella and generic Symbicort. Katie please advise.

## 2022-07-31 ENCOUNTER — Ambulatory Visit: Payer: Medicare HMO | Attending: General Surgery

## 2022-07-31 VITALS — Wt 185.5 lb

## 2022-07-31 DIAGNOSIS — Z483 Aftercare following surgery for neoplasm: Secondary | ICD-10-CM | POA: Insufficient documentation

## 2022-07-31 NOTE — Therapy (Signed)
OUTPATIENT PHYSICAL THERAPY SOZO SCREENING NOTE   Patient Name: Katherine Hanna MRN: [id number] DOB:May 10, 1956, 67 y.o., female Today's Date: 07/31/2022  PCP: Ann Held, DO REFERRING PROVIDER: Stark Klein, MD   PT End of Session - 07/31/22 0810     Visit Number 2   # unchanged due to screen only   PT Start Time 0808    PT Stop Time 0813    PT Time Calculation (min) 5 min    Activity Tolerance Patient tolerated treatment well    Behavior During Therapy WFL for tasks assessed/performed             Past Medical History:  Diagnosis Date   Allergic rhinitis    Arthritis    knee   Asthma    Breast cancer ([place])    History of chicken pox    Migraine    Rheumatic fever    as a child.   Past Surgical History:  Procedure Laterality Date   APPENDECTOMY  11/1971   BREAST LUMPECTOMY WITH RADIOACTIVE SEED AND SENTINEL LYMPH NODE BIOPSY Right 01/20/2020   Procedure: RIGHT BREAST LUMPECTOMY WITH RADIOACTIVE SEED AND SENTINEL LYMPH NODE BIOPSY;  Surgeon: Stark Klein, MD;  Location: [facility];  Service: General;  Laterality: Right;   COLONOSCOPY     TONSILLECTOMY     when she was 2 or 3   Patient Active Problem List   Diagnosis Date Noted   Moderate persistent asthmatic bronchitis with exacerbation 04/11/2022   URI (upper respiratory infection) 04/04/2022   Bronchitis 04/18/2021   Malignant neoplasm of upper-inner quadrant of right breast in female, estrogen receptor positive ([place]) 12/08/2019   Seasonal allergies 07/30/2017   Need for diphtheria-tetanus-pertussis (Tdap) vaccine 07/30/2017   Vitamin D deficiency 06/28/2016   Osteoporosis 06/18/2015   Asthma with bronchitis 06/26/2014   Food allergy 02/28/2012   Depression 10/11/2010   Preventative health care 10/05/2010   Screening for malignant neoplasm of the cervix 10/05/2010   COMMON MIGRAINE 08/31/2009   Allergic rhinitis 08/31/2009   CERVICAL POLYP 08/31/2009   ARTHRITIS 08/31/2009    POSTMENOPAUSAL STATUS 08/31/2009    REFERRING DIAG: right breast cancer at risk for lymphedema  THERAPY DIAG: Aftercare following surgery for neoplasm  PERTINENT HISTORY: Patient was diagnosed on 11/10/2019 with right grade II invasive ductal carcinoma breast cancer. Patient underwent a right lumpectomy and sentinel node biopsy on 01/20/2020 with 1/4 positive axillary lymph nodes. It is ER/PR positive and HER2 negative with a Ki67 of 10%.   PRECAUTIONS: right UE Lymphedema risk, None  SUBJECTIVE: Pt returns for her 6 month L-Dex   PAIN:  Are you having pain? No  SOZO SCREENING: Patient was assessed today using the SOZO machine to determine the lymphedema index score. This was compared to her baseline score. It was determined that she is within the recommended range when compared to her baseline and no further action is needed at this time. She will continue SOZO screenings. These are done every 3 months for 2 years post operatively followed by every 6 months for 2 years, and then annually.   L-DEX FLOWSHEETS - 07/31/22 0800       L-DEX LYMPHEDEMA SCREENING   Measurement Type Unilateral    L-DEX MEASUREMENT EXTREMITY Upper Extremity    POSITION  Standing    DOMINANT SIDE Right    At Risk Side Right    BASELINE SCORE (UNILATERAL) 4.3    L-DEX SCORE (UNILATERAL) 1.6    VALUE CHANGE (UNILAT) -2.7  Katherine Hanna, PTA 07/31/2022, 8:12 AM

## 2022-08-03 ENCOUNTER — Telehealth: Payer: Self-pay | Admitting: Family Medicine

## 2022-08-03 NOTE — Telephone Encounter (Signed)
[place] to schedule their annual wellness visit. Appointment made for 08/15/2022.  Sherol Dade; Care Guide Ambulatory Clinical [facility] Direct Dial: [phone]

## 2022-08-15 ENCOUNTER — Ambulatory Visit (INDEPENDENT_AMBULATORY_CARE_PROVIDER_SITE_OTHER): Payer: Medicare HMO | Admitting: *Deleted

## 2022-08-15 DIAGNOSIS — Z0000 Encounter for general adult medical examination without abnormal findings: Secondary | ICD-10-CM | POA: Diagnosis not present

## 2022-08-15 NOTE — Patient Instructions (Signed)
Ms. Katherine Hanna , Thank you for taking time to come for your Medicare Wellness Visit. I appreciate your ongoing commitment to your health goals. Please review the following plan we discussed and let me know if I can assist you in the future.   These are the goals we discussed:  Goals   None     This is a list of the screening recommended for you and due dates:  Health Maintenance  Topic Date Due   Colon Cancer Screening  10/12/2019   COVID-19 Vaccine (6 - 2023-24 season) 01/20/2022   DEXA scan (bone density measurement)  12/14/2022   Mammogram  12/15/2022   Medicare Annual Wellness Visit  08/15/2023   DTaP/Tdap/Td vaccine (3 - Td or Tdap) 07/31/2027   Pneumonia Vaccine  Completed   Flu Shot  Completed   Hepatitis C Screening: USPSTF Recommendation to screen - Ages 18-79 yo.  Completed   Zoster (Shingles) Vaccine  Completed   HPV Vaccine  Aged Out     Next appointment: Follow up in one year for your annual wellness visit.   Preventive Care 69 Years and Older, Female Preventive care refers to lifestyle choices and visits with your health care provider that can promote health and wellness. What does preventive care include? A yearly physical exam. This is also called an annual well check. Dental exams once or twice a year. Routine eye exams. Ask your health care provider how often you should have your eyes checked. Personal lifestyle choices, including: Daily care of your teeth and gums. Regular physical activity. Eating a healthy diet. Avoiding tobacco and drug use. Limiting alcohol use. Practicing safe sex. Taking low-dose aspirin every day. Taking vitamin and mineral supplements as recommended by your health care provider. What happens during an annual well check? The services and screenings done by your health care provider during your annual well check will depend on your age, overall health, lifestyle risk factors, and family history of disease. Counseling  Your health  care provider may ask you questions about your: Alcohol use. Tobacco use. Drug use. Emotional well-being. Home and relationship well-being. Sexual activity. Eating habits. History of falls. Memory and ability to understand (cognition). Work and work Statistician. Reproductive health. Screening  You may have the following tests or measurements: Height, weight, and BMI. Blood pressure. Lipid and cholesterol levels. These may be checked every 5 years, or more frequently if you are over 62 years old. Skin check. Lung cancer screening. You may have this screening every year starting at age 60 if you have a 30-pack-year history of smoking and currently smoke or have quit within the past 15 years. Fecal occult blood test (FOBT) of the stool. You may have this test every year starting at age 68. Flexible sigmoidoscopy or colonoscopy. You may have a sigmoidoscopy every 5 years or a colonoscopy every 10 years starting at age 59. Hepatitis C blood test. Hepatitis B blood test. Sexually transmitted disease (STD) testing. Diabetes screening. This is done by checking your blood sugar (glucose) after you have not eaten for a while (fasting). You may have this done every 1-3 years. Bone density scan. This is done to screen for osteoporosis. You may have this done starting at age 21. Mammogram. This may be done every 1-2 years. Talk to your health care provider about how often you should have regular mammograms. Talk with your health care provider about your test results, treatment options, and if necessary, the need for more tests. Vaccines  Your health care  provider may recommend certain vaccines, such as: Influenza vaccine. This is recommended every year. Tetanus, diphtheria, and acellular pertussis (Tdap, Td) vaccine. You may need a Td booster every 10 years. Zoster vaccine. You may need this after age 18. Pneumococcal 13-valent conjugate (PCV13) vaccine. One dose is recommended after age  69. Pneumococcal polysaccharide (PPSV23) vaccine. One dose is recommended after age 69. Talk to your health care provider about which screenings and vaccines you need and how often you need them. This information is not intended to replace advice given to you by your health care provider. Make sure you discuss any questions you have with your health care provider. Document Released: 06/04/2015 Document Revised: 01/26/2016 Document Reviewed: 03/09/2015 Elsevier Interactive Patient Education  2017 [place] Prevention in the Home Falls can cause injuries. They can happen to people of all ages. There are many things you can do to make your home safe and to help prevent falls. What can I do on the outside of my home? Regularly fix the edges of walkways and driveways and fix any cracks. Remove anything that might make you trip as you walk through a door, such as a raised step or threshold. Trim any bushes or trees on the path to your home. Use bright outdoor lighting. Clear any walking paths of anything that might make someone trip, such as rocks or tools. Regularly check to see if handrails are loose or broken. Make sure that both sides of any steps have handrails. Any raised decks and porches should have guardrails on the edges. Have any leaves, snow, or ice cleared regularly. Use sand or salt on walking paths during winter. Clean up any spills in your garage right away. This includes oil or grease spills. What can I do in the bathroom? Use night lights. Install grab bars by the toilet and in the tub and shower. Do not use towel bars as grab bars. Use non-skid mats or decals in the tub or shower. If you need to sit down in the shower, use a plastic, non-slip stool. Keep the floor dry. Clean up any water that spills on the floor as soon as it happens. Remove soap buildup in the tub or shower regularly. Attach bath mats securely with double-sided non-slip rug tape. Do not have throw  rugs and other things on the floor that can make you trip. What can I do in the bedroom? Use night lights. Make sure that you have a light by your bed that is easy to reach. Do not use any sheets or blankets that are too big for your bed. They should not hang down onto the floor. Have a firm chair that has side arms. You can use this for support while you get dressed. Do not have throw rugs and other things on the floor that can make you trip. What can I do in the kitchen? Clean up any spills right away. Avoid walking on wet floors. Keep items that you use a lot in easy-to-reach places. If you need to reach something above you, use a strong step stool that has a grab bar. Keep electrical cords out of the way. Do not use floor polish or wax that makes floors slippery. If you must use wax, use non-skid floor wax. Do not have throw rugs and other things on the floor that can make you trip. What can I do with my stairs? Do not leave any items on the stairs. Make sure that there are handrails on both  sides of the stairs and use them. Fix handrails that are broken or loose. Make sure that handrails are as long as the stairways. Check any carpeting to make sure that it is firmly attached to the stairs. Fix any carpet that is loose or worn. Avoid having throw rugs at the top or bottom of the stairs. If you do have throw rugs, attach them to the floor with carpet tape. Make sure that you have a light switch at the top of the stairs and the bottom of the stairs. If you do not have them, ask someone to add them for you. What else can I do to help prevent falls? Wear shoes that: Do not have high heels. Have rubber bottoms. Are comfortable and fit you well. Are closed at the toe. Do not wear sandals. If you use a stepladder: Make sure that it is fully opened. Do not climb a closed stepladder. Make sure that both sides of the stepladder are locked into place. Ask someone to hold it for you, if  possible. Clearly mark and make sure that you can see: Any grab bars or handrails. First and last steps. Where the edge of each step is. Use tools that help you move around (mobility aids) if they are needed. These include: Canes. Walkers. Scooters. Crutches. Turn on the lights when you go into a dark area. Replace any light bulbs as soon as they burn out. Set up your furniture so you have a clear path. Avoid moving your furniture around. If any of your floors are uneven, fix them. If there are any pets around you, be aware of where they are. Review your medicines with your doctor. Some medicines can make you feel dizzy. This can increase your chance of falling. Ask your doctor what other things that you can do to help prevent falls. This information is not intended to replace advice given to you by your health care provider. Make sure you discuss any questions you have with your health care provider. Document Released: 03/04/2009 Document Revised: 10/14/2015 Document Reviewed: 06/12/2014 Elsevier Interactive Patient Education  2017 [employer].

## 2022-08-15 NOTE — Progress Notes (Signed)
Subjective:   Katherine Hanna is a 67 y.o. female who presents for Medicare Annual (Subsequent) preventive examination.  I connected with  JAVAN TARABOCCHIA on 08/15/22 by a audio enabled telemedicine application and verified that I am speaking with the correct person using two identifiers.  Patient Location: Home  Provider Location: Office/Clinic  I discussed the limitations of evaluation and management by telemedicine. The patient expressed understanding and agreed to proceed.   Review of Systems     Cardiac Risk Factors include: advanced age (>21men, >63 women)     Objective:    There were no vitals filed for this visit. There is no height or weight on file to calculate BMI.     08/15/2022    1:00 PM 08/11/2021    9:37 AM 02/07/2021    8:36 AM 08/05/2020    9:27 AM 06/10/2020    8:21 AM 02/19/2020    8:12 AM 01/20/2020    8:23 AM  Advanced Directives  Does Patient Have a Medical Advance Directive? Yes Yes No Yes No Yes Yes  Type of Paramedic of [place];Living will;Out of facility DNR (pink MOST or yellow form) [place];Living will;Out of facility DNR (pink MOST or yellow form)  [place];Living will  [place];Living will [place];Living will  Does patient want to make changes to medical advance directive?  No - Patient declined  No - Patient declined   No - Patient declined  Copy of [place] in Chart? No - copy requested No - copy requested  No - copy requested  No - copy requested No - copy requested  Would patient like information on creating a medical advance directive?     No - Patient declined      Current Medications (verified) Outpatient Encounter Medications as of 08/15/2022  Medication Sig   albuterol (VENTOLIN HFA) 108 (90 Base) MCG/ACT inhaler Inhale 2 puffs into the lungs every 6 (six) hours as needed for wheezing or shortness of breath.    anastrozole (ARIMIDEX) 1 MG tablet TAKE 1 TABLET BY MOUTH EVERY DAY   benzonatate (TESSALON) 200 MG capsule Take 1 capsule (200 mg total) by mouth 3 (three) times daily as needed for cough.   Calcium Carbonate-Vitamin D (CALCIUM 500 + D PO) Take 1 tablet by mouth 2 (two) times daily.   cetirizine (ZYRTEC) 10 MG tablet Take 10 mg by mouth at bedtime.   co-enzyme Q-10 30 MG capsule Take 30 mg by mouth daily.   fluticasone (FLONASE) 50 MCG/ACT nasal spray Place 2 sprays into both nostrils daily.   fluticasone-salmeterol (WIXELA INHUB) 500-50 MCG/ACT AEPB Inhale 1 puff into the lungs in the morning and at bedtime.   HYDROcodone bit-homatropine (HYCODAN) 5-1.5 MG/5ML syrup Take 5 mLs by mouth every 6 (six) hours as needed for cough.   Misc Natural Products (TART CHERRY ADVANCED PO) Take by mouth.   Multiple Vitamin (MULTIVITAMIN) tablet Take 1 tablet by mouth daily.   TURMERIC PO Take by mouth.   [DISCONTINUED] predniSONE (DELTASONE) 10 MG tablet Take 2 tablets for 5 days then 1 tablet for 5 days. Take in AM with food   No facility-administered encounter medications on file as of 08/15/2022.    Allergies (verified) Penicillins   History: Past Medical History:  Diagnosis Date   Allergic rhinitis    Arthritis    knee   Asthma    Breast cancer ([place])  History of chicken pox    Migraine    Rheumatic fever    as a child.   Past Surgical History:  Procedure Laterality Date   APPENDECTOMY  11/1971   BREAST LUMPECTOMY WITH RADIOACTIVE SEED AND SENTINEL LYMPH NODE BIOPSY Right 01/20/2020   Procedure: RIGHT BREAST LUMPECTOMY WITH RADIOACTIVE SEED AND SENTINEL LYMPH NODE BIOPSY;  Surgeon: Stark Klein, MD;  Location: [facility];  Service: General;  Laterality: Right;   COLONOSCOPY     TONSILLECTOMY     when she was 17 or 3   Family History  Problem Relation Age of Onset   Lung cancer Mother        smoked   Diabetes Father    Stroke Father    Cancer Father        bladder    Leukemia Father    Stroke Maternal Grandmother        15s   Coronary artery disease Maternal Grandfather    Stroke Paternal Grandmother    Colon cancer Maternal Uncle    Breast cancer Paternal Aunt    Esophageal cancer Neg Hx    Social History   Socioeconomic History   Marital status: Married    Spouse name: Not on file   Number of children: 2   Years of education: Not on file   Highest education level: Not on file  Occupational History   Occupation: Engineer, maintenance (IT)  Tobacco Use   Smoking status: Never   Smokeless tobacco: Never  Vaping Use   Vaping Use: Never used  Substance and Sexual Activity   Alcohol use: No   Drug use: No   Sexual activity: Yes    Partners: Male  Other Topics Concern   Not on file  Social History Narrative   Exercise---   no    Social Determinants of Health   Financial Resource Strain: Low Risk  (08/11/2021)   Overall Financial Resource Strain (CARDIA)    Difficulty of Paying Living Expenses: Not hard at all  Food Insecurity: No Food Insecurity (08/15/2022)   Hunger Vital Sign    Worried About Running Out of Food in the Last Year: Never true    Ran Out of Food in the Last Year: Never true  Transportation Needs: No Transportation Needs (08/15/2022)   PRAPARE - Hydrologist (Medical): No    Lack of Transportation (Non-Medical): No  Physical Activity: Sufficiently Active (08/15/2022)   Exercise Vital Sign    Days of Exercise per Week: 3 days    Minutes of Exercise per Session: 90 min  Stress: No Stress Concern Present (08/11/2021)   [place]    Feeling of Stress : Not at all  Social Connections: Moderately Integrated (08/11/2021)   Social Connection and Isolation Panel [NHANES]    Frequency of Communication with Friends and Family: More than three times a week    Frequency of Social Gatherings with Friends and Family: Once a week    Attends Religious Services:  More than 4 times per year    Active Member of [employer] or Organizations: No    Attends Music therapist: Never    Marital Status: Married    Tobacco Counseling Counseling given: Not Answered   Clinical Intake:  Pre-visit preparation completed: Yes  Pain : No/denies pain  Diabetes: No  How often do you need to have someone help you when you read instructions, pamphlets, or other written  materials from your doctor or pharmacy?: 1 - Never  Activities of Daily Living    08/15/2022    1:04 PM  In your present state of health, do you have any difficulty performing the following activities:  Hearing? 0  Vision? 0  Difficulty concentrating or making decisions? 0  Walking or climbing stairs? 0  Dressing or bathing? 0  Doing errands, shopping? 0  Preparing Food and eating ? N  Using the Toilet? N  In the past six months, have you accidently leaked urine? Y  Comment stress incontinence  Do you have problems with loss of bowel control? N  Managing your Medications? N  Managing your Finances? N  Housekeeping or managing your Housekeeping? N    Patient Care Team: Carollee Herter, Alferd Apa, DO as PCP - General (Family Medicine) Sable Feil, MD as Consulting Physician (Gastroenterology) Hollar, Katharine Look, MD as Referring Physician (Dermatology) Lynnell Dike, OD as Consulting Physician (Optometry) Stark Klein, MD as Consulting Physician (General Surgery) Truitt Merle, MD as Consulting Physician (Hematology) Alla Feeling, NP as Nurse Practitioner (Nurse Practitioner) Freddi Starr, MD as Consulting Physician (Pulmonary Disease)  Indicate any recent Medical Services you may have received from other than [facility] providers in the past year (date may be approximate).     Assessment:   This is a routine wellness examination for Katherine Hanna.  Hearing/Vision screen No results found.  Dietary issues and exercise activities discussed: Current Exercise Habits:  Structured exercise class, Type of exercise: Other - see comments (line dancing class, chair exercises), Time (Minutes): > 60, Frequency (Times/Week): 3, Weekly Exercise (Minutes/Week): 0, Intensity: Moderate, Exercise limited by: None identified   Goals Addressed   None    Depression Screen    08/15/2022    1:03 PM 09/06/2021    8:19 AM 08/11/2021    9:38 AM 08/05/2020    9:12 AM 08/04/2019    9:07 AM 08/02/2018    8:07 AM 04/24/2013   10:31 AM  PHQ 2/9 Scores  PHQ - 2 Score 0 0 0 0 4 0 0  PHQ- 9 Score     5      Fall Risk    08/15/2022    1:01 PM 09/06/2021    8:19 AM 08/11/2021    9:37 AM 08/05/2020    9:25 AM 08/05/2020    9:12 AM  Fall Risk   Falls in the past year? 0 0 0 0 0  Number falls in past yr: 0 0 0  0  Injury with Fall? 0 0 0  0  Risk for fall due to : No Fall Risks  No Fall Risks No Fall Risks   Follow up Falls evaluation completed Falls evaluation completed Falls evaluation completed  Falls evaluation completed    [place]:  Any stairs in or around the home? Yes  If so, are there any without handrails? No  Home free of loose throw rugs in walkways, pet beds, electrical cords, etc? Yes  Adequate lighting in your home to reduce risk of falls? Yes   ASSISTIVE DEVICES UTILIZED TO PREVENT FALLS:  Life alert? No  Use of a cane, walker or w/c? No  Grab bars in the bathroom? No  Shower chair or bench in shower? No  Elevated toilet seat or a handicapped toilet? Yes   TIMED UP AND GO:  Was the test performed?  No, audio visit .   Cognitive Function:    08/05/2020  9:26 AM  MMSE - Mini Mental State Exam  Orientation to time 5  Orientation to Place 5  Registration 3  Attention/ Calculation 5  Recall 3  Language- name 2 objects 2  Language- repeat 1  Language- follow 3 step command 3  Language- read & follow direction 1  Write a sentence 1  Copy design 1  Total score 30        08/15/2022    1:08 PM 08/11/2021     9:38 AM  6CIT Screen  What Year? 0 points 0 points  What month? 0 points 0 points  What time? 0 points 0 points  Count back from 20 0 points 0 points  Months in reverse 0 points 0 points  Repeat phrase 0 points 0 points  Total Score 0 points 0 points    Immunizations Immunization History  Administered Date(s) Administered   Fluad Quad(high Dose 65+) 03/28/2019, 02/03/2022   Hep A / Hep B 03/15/2010, 07/25/2010   Influenza Whole 02/19/2013   Influenza, High Dose Seasonal PF 03/05/2020   Influenza,inj,Quad PF,6+ Mos 03/13/2014   Influenza-Unspecified 03/23/2015, 03/28/2017, 03/14/2018   Moderna Covid-19 Vaccine Bivalent Booster 41yrs & up 03/09/2021   Moderna Sars-Covid-2 Vaccination 07/04/2019, 07/28/2019, 03/19/2020, 12/10/2020   PNEUMOCOCCAL CONJUGATE-20 09/06/2021   Pneumococcal Conjugate-13 11/21/2019   Pneumococcal Polysaccharide-23 06/18/2015   Tdap 09/02/2006, 07/30/2017   Zoster Recombinat (Shingrix) 08/02/2018, 12/09/2018   Zoster, Live 04/28/2014    TDAP status: Up to date  Flu Vaccine status: Up to date  Pneumococcal vaccine status: Up to date  Covid-19 vaccine status: Information provided on how to obtain vaccines.   Qualifies for Shingles Vaccine? Yes   Zostavax completed Yes   Shingrix Completed?: Yes  Screening Tests Health Maintenance  Topic Date Due   COLONOSCOPY (Pts 45-71yrs Insurance coverage will need to be confirmed)  10/12/2019   COVID-19 Vaccine (6 - 2023-24 season) 01/20/2022   Medicare Annual Wellness (AWV)  08/12/2022   DEXA SCAN  12/14/2022   MAMMOGRAM  12/15/2022   DTaP/Tdap/Td (3 - Td or Tdap) 07/31/2027   Pneumonia Vaccine 38+ Years old  Completed   INFLUENZA VACCINE  Completed   Hepatitis C Screening  Completed   Zoster Vaccines- Shingrix  Completed   HPV VACCINES  Aged Out    Health Maintenance  Health Maintenance Due  Topic Date Due   COLONOSCOPY (Pts 45-39yrs Insurance coverage will need to be confirmed)  10/12/2019    COVID-19 Vaccine (6 - 2023-24 season) 01/20/2022   Medicare Annual Wellness (AWV)  08/12/2022    Colorectal Cancer screen: pt is overdue and stated that she will call GI office to get scheduled.  Mammogram status: Completed 12/14/21. Repeat every year  Bone Density status: Completed 12/13/20. Results reflect: Bone density results: OSTEOPENIA. Repeat every 2 years.  Lung Cancer Screening: (Low Dose CT Chest recommended if Age 78-80 years, 30 pack-year currently smoking OR have quit w/in 15years.) does not qualify.   Additional Screening:  Hepatitis C Screening: does qualify; Completed 06/18/15  Vision Screening: Recommended annual ophthalmology exams for early detection of glaucoma and other disorders of the eye. Is the patient up to date with their annual eye exam?  Yes  Who is the provider or what is the name of the office in which the patient attends annual eye exams? [facility] If pt is not established with a provider, would they like to be referred to a provider to establish care? No .   Dental Screening: Recommended annual dental  exams for proper oral hygiene  Community Resource Referral / Chronic Care Management: CRR required this visit?  No   CCM required this visit?  No      Plan:     I have personally reviewed and noted the following in the patient's chart:   Medical and social history Use of alcohol, tobacco or illicit drugs  Current medications and supplements including opioid prescriptions. Patient is not currently taking opioid prescriptions. Functional ability and status Nutritional status Physical activity Advanced directives List of other physicians Hospitalizations, surgeries, and ER visits in previous 12 months Vitals Screenings to include cognitive, depression, and falls Referrals and appointments  In addition, I have reviewed and discussed with patient certain preventive protocols, quality metrics, and best practice recommendations. A written  personalized care plan for preventive services as well as general preventive health recommendations were provided to patient.   Due to this being a telephonic visit, the after visit summary with patients personalized plan was offered to patient via mail or my-chart. Patient would like to access on my-chart.  Beatris Ship, [place]   08/15/2022   Nurse Notes: None

## 2022-09-08 ENCOUNTER — Encounter: Payer: Self-pay | Admitting: Family Medicine

## 2022-09-08 ENCOUNTER — Ambulatory Visit (INDEPENDENT_AMBULATORY_CARE_PROVIDER_SITE_OTHER): Payer: Medicare HMO | Admitting: Family Medicine

## 2022-09-08 VITALS — BP 112/70 | HR 73 | Temp 98.8°F | Resp 18 | Ht 65.0 in | Wt 182.8 lb

## 2022-09-08 DIAGNOSIS — Z136 Encounter for screening for cardiovascular disorders: Secondary | ICD-10-CM | POA: Diagnosis not present

## 2022-09-08 DIAGNOSIS — E2839 Other primary ovarian failure: Secondary | ICD-10-CM

## 2022-09-08 DIAGNOSIS — Z0000 Encounter for general adult medical examination without abnormal findings: Secondary | ICD-10-CM

## 2022-09-08 DIAGNOSIS — Z1211 Encounter for screening for malignant neoplasm of colon: Secondary | ICD-10-CM | POA: Diagnosis not present

## 2022-09-08 LAB — CBC WITH DIFFERENTIAL/PLATELET
Basophils Absolute: 0 10*3/uL (ref 0.0–0.1)
Basophils Relative: 0.5 % (ref 0.0–3.0)
Eosinophils Absolute: 0.1 10*3/uL (ref 0.0–0.7)
Eosinophils Relative: 1.2 % (ref 0.0–5.0)
HCT: 39.8 % (ref 36.0–46.0)
Hemoglobin: 13.5 g/dL (ref 12.0–15.0)
Lymphocytes Relative: 20.4 % (ref 12.0–46.0)
Lymphs Abs: 1.4 10*3/uL (ref 0.7–4.0)
MCHC: 33.9 g/dL (ref 30.0–36.0)
MCV: 94.1 fl (ref 78.0–100.0)
Monocytes Absolute: 0.4 10*3/uL (ref 0.1–1.0)
Monocytes Relative: 6.4 % (ref 3.0–12.0)
Neutro Abs: 5 10*3/uL (ref 1.4–7.7)
Neutrophils Relative %: 71.5 % (ref 43.0–77.0)
Platelets: 321 10*3/uL (ref 150.0–400.0)
RBC: 4.23 Mil/uL (ref 3.87–5.11)
RDW: 14 % (ref 11.5–15.5)
WBC: 7 10*3/uL (ref 4.0–10.5)

## 2022-09-08 LAB — COMPREHENSIVE METABOLIC PANEL
ALT: 17 U/L (ref 0–35)
AST: 20 U/L (ref 0–37)
Albumin: 4.4 g/dL (ref 3.5–5.2)
Alkaline Phosphatase: 80 U/L (ref 39–117)
BUN: 15 mg/dL (ref 6–23)
CO2: 28 mEq/L (ref 19–32)
Calcium: 9.8 mg/dL (ref 8.4–10.5)
Chloride: 104 mEq/L (ref 96–112)
Creatinine, Ser: 0.93 mg/dL (ref 0.40–1.20)
GFR: 63.13 mL/min (ref >60.00)
Glucose, Bld: 91 mg/dL (ref 70–99)
Potassium: 4.2 mEq/L (ref 3.5–5.1)
Sodium: 141 mEq/L (ref 135–145)
Total Bilirubin: 0.5 mg/dL (ref 0.2–1.2)
Total Protein: 6.9 g/dL (ref 6.0–8.3)

## 2022-09-08 LAB — LIPID PANEL
Cholesterol: 202 mg/dL — ABNORMAL HIGH (ref 0–200)
HDL: 53.8 mg/dL (ref >39.00)
LDL Cholesterol: 127 mg/dL — ABNORMAL HIGH (ref 0–99)
NonHDL: 147.92
Total CHOL/HDL Ratio: 4
Triglycerides: 103 mg/dL (ref 0.0–149.0)
VLDL: 20.6 mg/dL (ref 0.0–40.0)

## 2022-09-08 NOTE — Assessment & Plan Note (Signed)
Ghm utd Check labs  See AVS Health Maintenance  Topic Date Due   COLONOSCOPY (Pts 45-67yrs Insurance coverage will need to be confirmed)  10/12/2019   COVID-19 Vaccine (6 - 2023-24 season) 09/24/2022 (Originally 01/20/2022)   DEXA SCAN  12/14/2022   MAMMOGRAM  12/15/2022   INFLUENZA VACCINE  12/21/2022   Medicare Annual Wellness (AWV)  08/15/2023   DTaP/Tdap/Td (3 - Td or Tdap) 07/31/2027   Pneumonia Vaccine 55+ Years old  Completed   Hepatitis C Screening  Completed   Zoster Vaccines- Shingrix  Completed   HPV VACCINES  Aged Out

## 2022-09-08 NOTE — Progress Notes (Signed)
Subjective:   By signing my name below, I, Isabelle Course, attest that this documentation has been prepared under the direction and in the presence of Donato Schultz, DO 09/08/22   Patient ID: Katherine Hanna, female    DOB: 1956-05-17, 67 y.o.   MRN: [id number]  Chief Complaint  Patient presents with   Annual Exam    Pt states fasting     HPI Patient is in today for a comprehensive physical exam.   Her asthma is well managed. She has been using Wixela twice daily and Flonase at nighttime. She plans to schedule an appointment with pulmonology soon   She complains of tenderness in her left ankle. She states pain radiates up her ankle at times. After changing her shoes she noticed I  bit of improvement. She sometimes wears a brace and states that has helped. She denies any known trauma or discoloration.   She stays active by doing chair exercises every Tuesday morning and taking a line dancing class for 2 hours twice a week.   Last pap: 08/31/2009.   Last mammogram: 12/14/2021  Last colonoscopy: 5/23/202. Plans to schedule next with GI.  She is UTD on immunizations. She is receptive to getting her RSV vaccine soon.   She is UTD on routine vision and dental care.   Past Medical History:  Diagnosis Date   Allergic rhinitis    Arthritis    knee   Asthma    Breast cancer    History of chicken pox    Migraine    Rheumatic fever    as a child.    Past Surgical History:  Procedure Laterality Date   APPENDECTOMY  11/1971   BREAST LUMPECTOMY WITH RADIOACTIVE SEED AND SENTINEL LYMPH NODE BIOPSY Right 01/20/2020   Procedure: RIGHT BREAST LUMPECTOMY WITH RADIOACTIVE SEED AND SENTINEL LYMPH NODE BIOPSY;  Surgeon: Almond Lint, MD;  Location: [facility];  Service: General;  Laterality: Right;   COLONOSCOPY     TONSILLECTOMY     when she was 2 or 3    Family History  Problem Relation Age of Onset   Lung cancer Mother        smoked   Diabetes Father     Stroke Father    Cancer Father        bladder   Leukemia Father    Stroke Brother    Stroke Maternal Grandmother        40s   Coronary artery disease Maternal Grandfather    Stroke Paternal Grandmother    Colon cancer Maternal Uncle    Breast cancer Paternal Aunt    Esophageal cancer Neg Hx     Social History   Socioeconomic History   Marital status: Married    Spouse name: Not on file   Number of children: 2   Years of education: Not on file   Highest education level: Not on file  Occupational History   Occupation: IT trainer  Tobacco Use   Smoking status: Never   Smokeless tobacco: Never  Vaping Use   Vaping Use: Never used  Substance and Sexual Activity   Alcohol use: No   Drug use: No   Sexual activity: Yes    Partners: Male  Other Topics Concern   Not on file  Social History Narrative   Exercise---   chair exercise, line dancing class    Social Determinants of Health   Financial Resource Strain: Low Risk  (08/11/2021)   Overall  Financial Resource Strain (CARDIA)    Difficulty of Paying Living Expenses: Not hard at all  Food Insecurity: No Food Insecurity (08/15/2022)   Hunger Vital Sign    Worried About Running Out of Food in the Last Year: Never true    Ran Out of Food in the Last Year: Never true  Transportation Needs: No Transportation Needs (08/15/2022)   PRAPARE - Administrator, Civil Service (Medical): No    Lack of Transportation (Non-Medical): No  Physical Activity: Sufficiently Active (08/15/2022)   Exercise Vital Sign    Days of Exercise per Week: 3 days    Minutes of Exercise per Session: 90 min  Stress: No Stress Concern Present (08/11/2021)   [employer] of Occupational Health - Occupational Stress Questionnaire    Feeling of Stress : Not at all  Social Connections: Moderately Integrated (08/11/2021)   Social Connection and Isolation Panel [NHANES]    Frequency of Communication with Friends and Family: More than three times a week     Frequency of Social Gatherings with Friends and Family: Once a week    Attends Religious Services: More than 4 times per year    Active Member of [employer] or Organizations: No    Attends Banker Meetings: Never    Marital Status: Married  Catering manager Violence: Not At Risk (08/15/2022)   Humiliation, Afraid, Rape, and Kick questionnaire    Fear of Current or Ex-Partner: No    Emotionally Abused: No    Physically Abused: No    Sexually Abused: No    Outpatient Medications Prior to Visit  Medication Sig Dispense Refill   albuterol (VENTOLIN HFA) 108 (90 Base) MCG/ACT inhaler Inhale 2 puffs into the lungs every 6 (six) hours as needed for wheezing or shortness of breath. 8 g 0   anastrozole (ARIMIDEX) 1 MG tablet TAKE 1 TABLET BY MOUTH EVERY DAY 90 tablet 3   benzonatate (TESSALON) 200 MG capsule Take 1 capsule (200 mg total) by mouth 3 (three) times daily as needed for cough. 30 capsule 1   Calcium Carbonate-Vitamin D (CALCIUM 500 + D PO) Take 1 tablet by mouth 2 (two) times daily.     cetirizine (ZYRTEC) 10 MG tablet Take 10 mg by mouth at bedtime.     co-enzyme Q-10 30 MG capsule Take 30 mg by mouth daily.     fluticasone (FLONASE) 50 MCG/ACT nasal spray Place 2 sprays into both nostrils daily. 18.2 mL 2   fluticasone-salmeterol (WIXELA INHUB) 500-50 MCG/ACT AEPB Inhale 1 puff into the lungs in the morning and at bedtime. 60 each 5   HYDROcodone bit-homatropine (HYCODAN) 5-1.5 MG/5ML syrup Take 5 mLs by mouth every 6 (six) hours as needed for cough. 240 mL 0   Misc Natural Products (TART CHERRY ADVANCED PO) Take by mouth.     Multiple Vitamin (MULTIVITAMIN) tablet Take 1 tablet by mouth daily.     TURMERIC PO Take by mouth.     No facility-administered medications prior to visit.    Allergies  Allergen Reactions   Penicillins Rash    Rash head to toe    Review of Systems  Constitutional:  Negative for fever and malaise/fatigue.  HENT:  Negative for congestion.    Eyes:  Negative for blurred vision.  Respiratory:  Negative for cough and shortness of breath.   Cardiovascular:  Negative for chest pain, palpitations and leg swelling.  Gastrointestinal:  Negative for abdominal pain, blood in stool, nausea and vomiting.  Genitourinary:  Negative for dysuria and frequency.  Musculoskeletal:  Negative for back pain and falls.  Skin:  Negative for rash.  Neurological:  Negative for dizziness, loss of consciousness and headaches.  Endo/Heme/Allergies:  Negative for environmental allergies.  Psychiatric/Behavioral:  Negative for depression. The patient is not nervous/anxious.        Objective:    Physical Exam Vitals and nursing note reviewed.  Constitutional:      General: She is not in acute distress.    Appearance: Normal appearance.  HENT:     Head: Normocephalic and atraumatic.     Right Ear: External ear normal.     Left Ear: External ear normal.     Nose: Nose normal.     Mouth/Throat:     Pharynx: No oropharyngeal exudate or posterior oropharyngeal erythema.  Eyes:     Extraocular Movements: Extraocular movements intact.     Pupils: Pupils are equal, round, and reactive to light.  Cardiovascular:     Rate and Rhythm: Normal rate and regular rhythm.     Pulses: Normal pulses.     Heart sounds: Normal heart sounds. No murmur heard.    No gallop.  Pulmonary:     Effort: Pulmonary effort is normal. No respiratory distress.     Breath sounds: Normal breath sounds. No wheezing.  Abdominal:     General: Abdomen is flat.     Palpations: Abdomen is soft.     Tenderness: There is no abdominal tenderness.  Musculoskeletal:        General: Normal range of motion.     Cervical back: Normal range of motion and neck supple.  Skin:    General: Skin is warm and dry.  Neurological:     General: No focal deficit present.     Mental Status: She is alert and oriented to person, place, and time.  Psychiatric:        Mood and Affect: Mood normal.         Judgment: Judgment normal.     BP 112/70 (BP Location: Left Arm, Patient Position: Sitting, Cuff Size: Normal)   Pulse 73   Temp 98.8 F (37.1 C) (Oral)   Resp 18   Ht  (1.651 m)   Wt 182 lb 12.8 oz (82.9 kg)   SpO2 97%   BMI 30.42 kg/m  Wt Readings from Last 3 Encounters:  09/08/22 182 lb 12.8 oz (82.9 kg)  07/31/22 185 lb 8 oz (84.1 kg)  06/07/22 186 lb 3.2 oz (84.5 kg)       Assessment & Plan:  Preventative health care  Ischemic heart disease screen -     CBC with Differential/Platelet -     Comprehensive metabolic panel -     Lipid panel  Colon cancer screening -     Ambulatory referral to Gastroenterology  Estrogen deficiency -     DG Bone Density; Future     I,Rachel Rivera,acting as a scribe for Donato Schultz, DO.,have documented all relevant documentation on the behalf of Donato Schultz, DO,as directed by  Donato Schultz, DO while in the presence of Donato Schultz, DO.   I, Donato Schultz, DO, personally preformed the services described in this documentation.  All medical record entries made by the scribe were at my direction and in my presence.  I have reviewed the chart and discharge instructions (if applicable) and agree that the record reflects my personal performance and  is accurate and complete. 09/08/22   Donato Schultz, DO

## 2022-09-08 NOTE — Patient Instructions (Signed)
Preventive Care 65 Years and Older, Female Preventive care refers to lifestyle choices and visits with your health care provider that can promote health and wellness. Preventive care visits are also called wellness exams. What can I expect for my preventive care visit? Counseling Your health care provider may ask you questions about your: Medical history, including: Past medical problems. Family medical history. Pregnancy and menstrual history. History of falls. Current health, including: Memory and ability to understand (cognition). Emotional well-being. Home life and relationship well-being. Sexual activity and sexual health. Lifestyle, including: Alcohol, nicotine or tobacco, and drug use. Access to firearms. Diet, exercise, and sleep habits. Work and work environment. Sunscreen use. Safety issues such as seatbelt and bike helmet use. Physical exam Your health care provider will check your: Height and weight. These may be used to calculate your BMI (body mass index). BMI is a measurement that tells if you are at a healthy weight. Waist circumference. This measures the distance around your waistline. This measurement also tells if you are at a healthy weight and may help predict your risk of certain diseases, such as type 2 diabetes and high blood pressure. Heart rate and blood pressure. Body temperature. Skin for abnormal spots. What immunizations do I need?  Vaccines are usually given at various ages, according to a schedule. Your health care provider will recommend vaccines for you based on your age, medical history, and lifestyle or other factors, such as travel or where you work. What tests do I need? Screening Your health care provider may recommend screening tests for certain conditions. This may include: Lipid and cholesterol levels. Hepatitis C test. Hepatitis B test. HIV (human immunodeficiency virus) test. STI (sexually transmitted infection) testing, if you are at  risk. Lung cancer screening. Colorectal cancer screening. Diabetes screening. This is done by checking your blood sugar (glucose) after you have not eaten for a while (fasting). Mammogram. Talk with your health care provider about how often you should have regular mammograms. BRCA-related cancer screening. This may be done if you have a family history of breast, ovarian, tubal, or peritoneal cancers. Bone density scan. This is done to screen for osteoporosis. Talk with your health care provider about your test results, treatment options, and if necessary, the need for more tests. Follow these instructions at home: Eating and drinking  Eat a diet that includes fresh fruits and vegetables, whole grains, lean protein, and low-fat dairy products. Limit your intake of foods with high amounts of sugar, saturated fats, and salt. Take vitamin and mineral supplements as recommended by your health care provider. Do not drink alcohol if your health care provider tells you not to drink. If you drink alcohol: Limit how much you have to 0-1 drink a day. Know how much alcohol is in your drink. In the [place] one drink equals one 12 oz bottle of beer (355 mL), one 5 oz glass of wine (148 mL), or one 1 oz glass of hard liquor (44 mL). Lifestyle Brush your teeth every morning and night with fluoride toothpaste. Floss one time each day. Exercise for at least 30 minutes 5 or more days each week. Do not use any products that contain nicotine or tobacco. These products include cigarettes, chewing tobacco, and vaping devices, such as e-cigarettes. If you need help quitting, ask your health care provider. Do not use drugs. If you are sexually active, practice safe sex. Use a condom or other form of protection in order to prevent STIs. Take aspirin only as told by   your health care provider. Make sure that you understand how much to take and what form to take. Work with your health care provider to find out whether it  is safe and beneficial for you to take aspirin daily. Ask your health care provider if you need to take a cholesterol-lowering medicine (statin). Find healthy ways to manage stress, such as: Meditation, yoga, or listening to music. Journaling. Talking to a trusted person. Spending time with friends and family. Minimize exposure to UV radiation to reduce your risk of skin cancer. Safety Always wear your seat belt while driving or riding in a vehicle. Do not drive: If you have been drinking alcohol. Do not ride with someone who has been drinking. When you are tired or distracted. While texting. If you have been using any mind-altering substances or drugs. Wear a helmet and other protective equipment during sports activities. If you have firearms in your house, make sure you follow all gun safety procedures. What's next? Visit your health care provider once a year for an annual wellness visit. Ask your health care provider how often you should have your eyes and teeth checked. Stay up to date on all vaccines. This information is not intended to replace advice given to you by your health care provider. Make sure you discuss any questions you have with your health care provider. Document Revised: 11/03/2020 Document Reviewed: 11/03/2020 Elsevier Patient Education  2023 Elsevier Inc.   

## 2022-09-11 DIAGNOSIS — D229 Melanocytic nevi, unspecified: Secondary | ICD-10-CM | POA: Diagnosis not present

## 2022-09-11 DIAGNOSIS — D225 Melanocytic nevi of trunk: Secondary | ICD-10-CM | POA: Diagnosis not present

## 2022-09-11 DIAGNOSIS — L814 Other melanin hyperpigmentation: Secondary | ICD-10-CM | POA: Diagnosis not present

## 2022-09-11 DIAGNOSIS — L821 Other seborrheic keratosis: Secondary | ICD-10-CM | POA: Diagnosis not present

## 2022-09-26 ENCOUNTER — Encounter: Payer: Self-pay | Admitting: Pulmonary Disease

## 2022-09-26 ENCOUNTER — Ambulatory Visit: Payer: Medicare HMO | Admitting: Pulmonary Disease

## 2022-09-26 VITALS — BP 128/72 | HR 67 | Ht 65.0 in | Wt 182.0 lb

## 2022-09-26 DIAGNOSIS — J310 Chronic rhinitis: Secondary | ICD-10-CM

## 2022-09-26 DIAGNOSIS — J4540 Moderate persistent asthma, uncomplicated: Secondary | ICD-10-CM | POA: Diagnosis not present

## 2022-09-26 MED ORDER — FLUTICASONE-SALMETEROL 500-50 MCG/ACT IN AEPB
1.0000 | INHALATION_SPRAY | Freq: Two times a day (BID) | RESPIRATORY_TRACT | 5 refills | Status: DC
Start: 2022-09-26 — End: 2023-05-10

## 2022-09-26 MED ORDER — FLUTICASONE PROPIONATE 50 MCG/ACT NA SUSP
2.0000 | Freq: Every day | NASAL | 5 refills | Status: DC
Start: 2022-09-26 — End: 2023-02-05

## 2022-09-26 MED ORDER — MONTELUKAST SODIUM 10 MG PO TABS
10.0000 mg | ORAL_TABLET | Freq: Every day | ORAL | 11 refills | Status: DC
Start: 2022-09-26 — End: 2023-05-10

## 2022-09-26 NOTE — Patient Instructions (Addendum)
Continue wixella 500-37mcg 1 puff twice daily - rinse mouth out after each use  Continue to use albuterol inhaler 1-2 puffs every 4-6 hours as needed  Start montelukast (singulair) 10mg  at daily at bedtime  Continue Zyrtec and flonase for allergies  Follow up in 6 months

## 2022-09-26 NOTE — Progress Notes (Signed)
Synopsis: Referred in January 2023 for bronchitis by Barry Dienes, DO  Subjective:   PATIENT ID: Katherine Hanna GENDER: female DOB: [birth year], MRN: [id number]  HPI  Chief Complaint  Patient presents with   Follow-up    3 mo f/u for asthma. States she has been doing well since last visit.    Katherine Hanna is a 67 year old woman, never smoker with history of breast cancer and allergic rhinitis who returns to pulmonary clinic for asthma.  She was seen multiple times since last visit by Rhunette Croft, NP due to URIs with most recent visit 06/07/22 where she was feeling better after course of steroids. Her asthma regimen includes wixela inhub 500-43mcg 1 puff twice daily and as needed albuterol. She is taking zyrtec for allergies.  Her breathing is ok at this time. She reports some mild issues with her breathing after being outside with chest tightness due to allergies.   OV 01/16/22 PFTs today show normal lung function  She has not required advair HFA 115-32mcg 2 puffs twice daily with spacer over the past 2 months. She denies issues with wheezing, cough or dyspnea at this time.   OV 09/08/21 She was transitioned from flovent to advair HFA 115-78mcg 2 puffs twice daily with spacer at last visit. She tried to taper down to 1 puff twice a day but reports a return of her symptoms. Once resuming 2 puffs twice daily she felt better. She is planning to continue the inhaler through out the spring pollen season and then attempt to taper off once again.  She has noticed intermittent blood tinged nasal secretions. She has occasional post-nasal drainage.  OV 05/31/21 She reports developing upper respiratory viral infection 9 to 10 weeks ago. She tested negative for covid at that time. She thought she had the flu. She had cough, wheezing and shortness of breath along with fatigue. She was initially treated with a zpak without much improvement. She was then treated with a course of prednisone and another  zpak with some improvement. She completed 2 more rounds of antibiotics and another round of prednisone with some improvement and was then placed on flovent 2 puffs twice daily with spacer and PRN albuterol inhaler. She reports the cough is much better at this time and is not experiencing wheezing now. She does have some sinus congestion with post nasal drainage. Denies significant reflux symptoms. She is taking zyrtec for seasonal allergies.   She reports having intermittent wheezing with previous episodes of viral infections that did not take this long to resolve.   She had CT scan 11/29 which showed anterior peripheral fibrotic changes of the right upper lobe secondary to breast radiation. She has minimal mucous plugging of the right lower lobe distal airways. No opacities or infusions.   Past Medical History:  Diagnosis Date   Allergic rhinitis    Arthritis    knee   Asthma    Breast cancer (HCC)    History of chicken pox    Migraine    Rheumatic fever    as a child.     Family History  Problem Relation Age of Onset   Lung cancer Mother        smoked   Diabetes Father    Stroke Father    Cancer Father        bladder   Leukemia Father    Stroke Brother    Stroke Maternal Grandmother        36s  Coronary artery disease Maternal Grandfather    Stroke Paternal Grandmother    Colon cancer Maternal Uncle    Breast cancer Paternal Aunt    Esophageal cancer Neg Hx      Social History   Socioeconomic History   Marital status: Married    Spouse name: Not on file   Number of children: 2   Years of education: Not on file   Highest education level: Not on file  Occupational History   Occupation: IT trainer  Tobacco Use   Smoking status: Never   Smokeless tobacco: Never  Vaping Use   Vaping Use: Never used  Substance and Sexual Activity   Alcohol use: No   Drug use: No   Sexual activity: Yes    Partners: Male  Other Topics Concern   Not on file  Social History  Narrative   Exercise---   chair exercise, line dancing class    Social Determinants of Health   Financial Resource Strain: Low Risk  (08/11/2021)   Overall Financial Resource Strain (CARDIA)    Difficulty of Paying Living Expenses: Not hard at all  Food Insecurity: No Food Insecurity (08/15/2022)   Hunger Vital Sign    Worried About Running Out of Food in the Last Year: Never true    Ran Out of Food in the Last Year: Never true  Transportation Needs: No Transportation Needs (08/15/2022)   PRAPARE - Administrator, Civil Service (Medical): No    Lack of Transportation (Non-Medical): No  Physical Activity: Sufficiently Active (08/15/2022)   Exercise Vital Sign    Days of Exercise per Week: 3 days    Minutes of Exercise per Session: 90 min  Stress: No Stress Concern Present (08/11/2021)   [employer] of Occupational Health - Occupational Stress Questionnaire    Feeling of Stress : Not at all  Social Connections: Moderately Integrated (08/11/2021)   Social Connection and Isolation Panel [NHANES]    Frequency of Communication with Friends and Family: More than three times a week    Frequency of Social Gatherings with Friends and Family: Once a week    Attends Religious Services: More than 4 times per year    Active Member of [employer] or Organizations: No    Attends Banker Meetings: Never    Marital Status: Married  Catering manager Violence: Not At Risk (08/15/2022)   Humiliation, Afraid, Rape, and Kick questionnaire    Fear of Current or Ex-Partner: No    Emotionally Abused: No    Physically Abused: No    Sexually Abused: No     Allergies  Allergen Reactions   Penicillins Rash    Rash head to toe     Outpatient Medications Prior to Visit  Medication Sig Dispense Refill   albuterol (VENTOLIN HFA) 108 (90 Base) MCG/ACT inhaler Inhale 2 puffs into the lungs every 6 (six) hours as needed for wheezing or shortness of breath. 8 g 0   anastrozole (ARIMIDEX) 1  MG tablet TAKE 1 TABLET BY MOUTH EVERY DAY 90 tablet 3   benzonatate (TESSALON) 200 MG capsule Take 1 capsule (200 mg total) by mouth 3 (three) times daily as needed for cough. 30 capsule 1   Calcium Carbonate-Vitamin D (CALCIUM 500 + D PO) Take 1 tablet by mouth 2 (two) times daily.     cetirizine (ZYRTEC) 10 MG tablet Take 10 mg by mouth at bedtime.     co-enzyme Q-10 30 MG capsule Take 30 mg by mouth daily.  HYDROcodone bit-homatropine (HYCODAN) 5-1.5 MG/5ML syrup Take 5 mLs by mouth every 6 (six) hours as needed for cough. 240 mL 0   Misc Natural Products (TART CHERRY ADVANCED PO) Take by mouth.     Multiple Vitamin (MULTIVITAMIN) tablet Take 1 tablet by mouth daily.     TURMERIC PO Take by mouth.     fluticasone (FLONASE) 50 MCG/ACT nasal spray Place 2 sprays into both nostrils daily. 18.2 mL 2   fluticasone-salmeterol (WIXELA INHUB) 500-50 MCG/ACT AEPB Inhale 1 puff into the lungs in the morning and at bedtime. 60 each 5   No facility-administered medications prior to visit.   Review of Systems  Constitutional:  Negative for chills, fever, malaise/fatigue and weight loss.  HENT:  Negative for congestion, sinus pain and sore throat.   Eyes: Negative.   Respiratory:  Negative for cough, hemoptysis, sputum production, shortness of breath and wheezing.   Cardiovascular:  Negative for chest pain, palpitations, orthopnea, claudication and leg swelling.  Gastrointestinal:  Negative for abdominal pain, heartburn, nausea and vomiting.  Genitourinary: Negative.   Musculoskeletal:  Negative for joint pain and myalgias.  Skin:  Negative for rash.  Neurological:  Negative for weakness.  Endo/Heme/Allergies:  Positive for environmental allergies.  Psychiatric/Behavioral: Negative.      Objective:   Vitals:   09/26/22 0839  BP: 128/72  Pulse: 67  SpO2: 99%  Weight: 182 lb (82.6 kg)  Height: 5\' 5"  (1.651 m)    Physical Exam Constitutional:      General: She is not in acute  distress.    Appearance: She is not ill-appearing.  HENT:     Head: Normocephalic and atraumatic.  Eyes:     General: No scleral icterus.    Conjunctiva/sclera: Conjunctivae normal.  Cardiovascular:     Rate and Rhythm: Normal rate and regular rhythm.     Pulses: Normal pulses.     Heart sounds: Normal heart sounds. No murmur heard. Pulmonary:     Effort: Pulmonary effort is normal.     Breath sounds: Normal breath sounds. No wheezing, rhonchi or rales.  Musculoskeletal:     Right lower leg: No edema.     Left lower leg: No edema.  Skin:    General: Skin is warm and dry.  Neurological:     General: No focal deficit present.     Mental Status: She is alert.    CBC    Component Value Date/Time   WBC 7.0 09/08/2022 0855   RBC 4.23 09/08/2022 0855   HGB 13.5 09/08/2022 0855   HGB 14.1 05/09/2022 0840   HCT 39.8 09/08/2022 0855   PLT 321.0 09/08/2022 0855   PLT 324 05/09/2022 0840   MCV 94.1 09/08/2022 0855   MCH 32.9 05/09/2022 0840   MCHC 33.9 09/08/2022 0855   RDW 14.0 09/08/2022 0855   LYMPHSABS 1.4 09/08/2022 0855   MONOABS 0.4 09/08/2022 0855   EOSABS 0.1 09/08/2022 0855   BASOSABS 0.0 09/08/2022 0855      Latest Ref Rng & Units 09/08/2022    8:55 AM 05/09/2022    8:40 AM 09/19/2021   12:29 PM  BMP  Glucose 70 - 99 mg/dL 91  93  161   BUN 6 - 23 mg/dL 15  14  13    Creatinine 0.40 - 1.20 mg/dL 0.96  0.45  4.09   Sodium 135 - 145 mEq/L 141  142  139   Potassium 3.5 - 5.1 mEq/L 4.2  3.7  4.0   Chloride  96 - 112 mEq/L 104  108  108   CO2 19 - 32 mEq/L 28  21  24    Calcium 8.4 - 10.5 mg/dL 9.8  9.6  9.5    Chest imaging: CT Chest 04/19/21 1. The density of concern on chest radiography corresponds to some sclerotic spurring of the left anterior first rib costochondral junction. There is no true underlying pulmonary nodule in this vicinity. 2. Hazy interstitial accentuation anteriorly in the right lung is likely primarily related to prior radiation therapy  given the other findings of prior breast surgery and right axillary dissection. 3. Minimal airway plugging in both lower lobes. 4.  Aortic Atherosclerosis  PFT:    Latest Ref Rng & Units 01/16/2022    8:29 AM  PFT Results  FVC-Pre L 2.60   FVC-Predicted Pre % 80   FVC-Post L 2.57   FVC-Predicted Post % 79   Pre FEV1/FVC % % 82   Post FEV1/FCV % % 84   FEV1-Pre L 2.12   FEV1-Predicted Pre % 86   FEV1-Post L 2.15   DLCO uncorrected ml/min/mmHg 21.53   DLCO UNC% % 105   DLCO corrected ml/min/mmHg 21.53   DLCO COR %Predicted % 105   DLVA Predicted % 123   TLC L 4.36   TLC % Predicted % 83   RV % Predicted % 68     Labs:  Path:  Echo:  Heart Catheterization:  Assessment & Plan:   Moderate persistent asthma without complication - Plan: montelukast (SINGULAIR) 10 MG tablet  Chronic rhinitis - Plan: fluticasone (FLONASE) 50 MCG/ACT nasal spray  Moderate persistent asthma, unspecified whether complicated - Plan: fluticasone-salmeterol (WIXELA INHUB) 500-50 MCG/ACT AEPB  Discussion: Katherine Hanna is a 67 year old woman, never smoker with history of breast cancer, and allergic rhinitis who returns to pulmonary clinic for asthma.  She is to continue wixella 500-54mcg 1 puff twice daily and as needed albuterol. We will add montelukast 10mg  daily to her regimen. She is to continue zyrtec and flonase for allergies.   Follow up in 6 months.  Melody Comas, MD [facility] Office: [phone]    Current Outpatient Medications:    albuterol (VENTOLIN HFA) 108 (90 Base) MCG/ACT inhaler, Inhale 2 puffs into the lungs every 6 (six) hours as needed for wheezing or shortness of breath., Disp: 8 g, Rfl: 0   anastrozole (ARIMIDEX) 1 MG tablet, TAKE 1 TABLET BY MOUTH EVERY DAY, Disp: 90 tablet, Rfl: 3   benzonatate (TESSALON) 200 MG capsule, Take 1 capsule (200 mg total) by mouth 3 (three) times daily as needed for cough., Disp: 30 capsule, Rfl: 1   Calcium  Carbonate-Vitamin D (CALCIUM 500 + D PO), Take 1 tablet by mouth 2 (two) times daily., Disp: , Rfl:    cetirizine (ZYRTEC) 10 MG tablet, Take 10 mg by mouth at bedtime., Disp: , Rfl:    co-enzyme Q-10 30 MG capsule, Take 30 mg by mouth daily., Disp: , Rfl:    HYDROcodone bit-homatropine (HYCODAN) 5-1.5 MG/5ML syrup, Take 5 mLs by mouth every 6 (six) hours as needed for cough., Disp: 240 mL, Rfl: 0   Misc Natural Products (TART CHERRY ADVANCED PO), Take by mouth., Disp: , Rfl:    montelukast (SINGULAIR) 10 MG tablet, Take 1 tablet (10 mg total) by mouth at bedtime., Disp: 30 tablet, Rfl: 11   Multiple Vitamin (MULTIVITAMIN) tablet, Take 1 tablet by mouth daily., Disp: , Rfl:    TURMERIC PO, Take by  mouth., Disp: , Rfl:    fluticasone (FLONASE) 50 MCG/ACT nasal spray, Place 2 sprays into both nostrils daily., Disp: 18.2 mL, Rfl: 5   fluticasone-salmeterol (WIXELA INHUB) 500-50 MCG/ACT AEPB, Inhale 1 puff into the lungs in the morning and at bedtime., Disp: 60 each, Rfl: 5

## 2022-11-05 NOTE — Progress Notes (Unsigned)
Patient Care Team: [name], [name], DO as PCP - General (Family Medicine) Hollar, Ronal Fear, MD as Referring Physician (Dermatology) [name], OD as Consulting Physician (Optometry) [name], MD as Consulting Physician (Hematology) [name], NP as Nurse Practitioner (Nurse Practitioner) [name], MD as Consulting Physician (Pulmonary Disease) [name], MD as Consulting Physician (Gastroenterology)   CHIEF COMPLAINT: Follow up right breast cancer   Oncology History Overview Note  Cancer Staging Malignant neoplasm of upper-inner quadrant of right breast in female, estrogen receptor positive (HCC) Staging form: Breast, AJCC 8th Edition - Clinical stage from 12/03/2019: Stage IA (cT1c, cN0, cM0, G2, ER+, PR+, HER2-) - Signed by [name], MD on 12/09/2019 - Pathologic stage from 01/20/2020: Stage IA (pT1c, pN1a, cM0, G2, ER+, PR+, HER2-) - Signed by [name], MD on 04/06/2020    Malignant neoplasm of upper-inner quadrant of right breast in female, estrogen receptor positive (HCC)  11/25/2019 Mammogram   Mammogram and [place] 11/25/19  IMPRESSION The 1.1x0.8x0.9cm architectural distortion in the right breast at 3:00 position middle depth 4 cm from nipple is highly suggestive of malignancy. An US guided biopsy is recommended.     12/03/2019 Initial Biopsy   Diagnosis 12/03/19 Breast, right, needle core biopsy, 3 o'clock, 4 cmfn - INVASIVE DUCTAL CARCINOMA. - DUCTAL CARCINOMA IN SITU. Microscopic Comment The carcinoma appears grade 2. The greatest linear extent of tumor in any one core is 10 mm. Ancillary studies will be reported separately. Results reported to [employer] on 12/04/2019. Intradepartmental consultation (Dr. Charm Barges).   12/03/2019 Receptors her2   PROGNOSTIC INDICATORS Results: IMMUNOHISTOCHEMICAL AND MORPHOMETRIC ANALYSIS PERFORMED MANUALLY The tumor cells are EQUIVOCAL for Her2 (2+). Her2 by FISH will be performed and  results reported separately. Estrogen Receptor: 95%, POSITIVE, STRONG STAINING INTENSITY Progesterone Receptor: 80%, POSITIVE, STRONG STAINING INTENSITY Proliferation Marker Ki67: 10%   FLUORESCENCE IN-SITU HYBRIDIZATION Results: GROUP 5: HER2 **NEGATIVE** Equivocal form of amplification of the HER2 gene was detected in the IHC 2+ tissue sample received from this individual. HER2 FISH was performed by a technologist and cell imaging and analysis on the [employer].   12/03/2019 Cancer Staging   Staging form: Breast, AJCC 8th Edition - Clinical stage from 12/03/2019: Stage IA (cT1c, cN0, cM0, G2, ER+, PR+, HER2-) - Signed by [name], MD on 12/09/2019   12/08/2019 Initial Diagnosis   Malignant neoplasm of upper-inner quadrant of right breast in female, estrogen receptor positive (HCC)   01/20/2020 Surgery   RIGHT BREAST LUMPECTOMY WITH RADIOACTIVE SEED AND SENTINEL LYMPH NODE BIOPSY by Donell Beers    01/20/2020 Pathology Results   FINAL MICROSCOPIC DIAGNOSIS:   A. BREAST, RIGHT, LUMPECTOMY:  - Invasive ductal carcinoma, grade 2, spanning 1.3 cm.  - Intermediate grade ductal carcinoma in situ.  - Invasive carcinoma is <1 mm from the lateral margin focally.  - In situ carcinoma is 3 mm from the lateral margin focally.  - Biopsy site.  - Fibrocystic change and usual ductal hyperplasia.  - See oncology table.   B. LYMPH NODE, RIGHT AXILLARY #1, SENTINEL, EXCISION:  - Metastatic carcinoma in one of one lymph nodes (1/1).  - Focal extracapsular extension.   C. LYMPH NODE, RIGHT AXILLARY #2, SENTINEL, EXCISION:  - One of one lymph nodes negative for carcinoma (0/1).   D. LYMPH NODE, RIGHT AXILLARY #3, SENTINEL, EXCISION:  - One of one lymph nodes negative for carcinoma (0/1).   E. LYMPH NODE, RIGHT AXILLARY #4, SENTINEL, EXCISION:  - One  of one lymph nodes negative for carcinoma (0/1).     ADDENDUM:   By immunohistochemistry, HER-2 is EQUIVOCAL (2+).  HER-2 by FISH is  pending and will  be reported in an addendum.   ADDENDUM:   FLOURESCENCE IN-SITU HYBRIDIZATION RESULTS:   GROUP 5:   HER2 **NEGATIVE**    01/20/2020 Miscellaneous   Mammaprint Low risk Luminal Type A - MPI at +0.311 She has 97.8% benefit of Horomal Therpay alone    01/20/2020 Cancer Staging   Staging form: Breast, AJCC 8th Edition - Pathologic stage from 01/20/2020: Stage IA (pT1c, pN1a, cM0, G2, ER+, PR+, HER2-) - Signed by [name], MD on 04/06/2020   02/26/2020 - 04/13/2020 Radiation Therapy   Adjuvant Radiation with Dr Roselind Messier    04/2020 -  Anti-estrogen oral therapy   Anastrozole 1mg  daily starting in 04/2020    07/13/2020 Survivorship   SCP delivered virtually by Santiago Glad, NP       CURRENT THERAPY:  -Anastrozole 1 mg daily starting 04/2020 -Zometa q6 months x2 years starting 10/06/20  INTERVAL HISTORY Katherine Hanna returns for follow up as scheduled. Last seen by me 05/09/22 and completed second Zometa. Mammo 12/14/21 was benign. She continues anastrozole  ROS   Past Medical History:  Diagnosis Date   Allergic rhinitis    Arthritis    knee   Asthma    Breast cancer (HCC)    History of chicken pox    Migraine    Rheumatic fever    as a child.     Past Surgical History:  Procedure Laterality Date   APPENDECTOMY  11/1971   BREAST LUMPECTOMY WITH RADIOACTIVE SEED AND SENTINEL LYMPH NODE BIOPSY Right 01/20/2020   Procedure: RIGHT BREAST LUMPECTOMY WITH RADIOACTIVE SEED AND SENTINEL LYMPH NODE BIOPSY;  Surgeon: Almond Lint, MD;  Location: [facility];  Service: General;  Laterality: Right;   COLONOSCOPY     TONSILLECTOMY     when she was 2 or 3     Outpatient Encounter Medications as of 11/07/2022  Medication Sig   albuterol (VENTOLIN HFA) 108 (90 Base) MCG/ACT inhaler Inhale 2 puffs into the lungs every 6 (six) hours as needed for wheezing or shortness of breath.   anastrozole (ARIMIDEX) 1 MG tablet TAKE 1 TABLET BY MOUTH EVERY DAY   benzonatate (TESSALON) 200 MG  capsule Take 1 capsule (200 mg total) by mouth 3 (three) times daily as needed for cough.   Calcium Carbonate-Vitamin D (CALCIUM 500 + D PO) Take 1 tablet by mouth 2 (two) times daily.   cetirizine (ZYRTEC) 10 MG tablet Take 10 mg by mouth at bedtime.   co-enzyme Q-10 30 MG capsule Take 30 mg by mouth daily.   fluticasone (FLONASE) 50 MCG/ACT nasal spray Place 2 sprays into both nostrils daily.   fluticasone-salmeterol (WIXELA INHUB) 500-50 MCG/ACT AEPB Inhale 1 puff into the lungs in the morning and at bedtime.   HYDROcodone bit-homatropine (HYCODAN) 5-1.5 MG/5ML syrup Take 5 mLs by mouth every 6 (six) hours as needed for cough.   Misc Natural Products (TART CHERRY ADVANCED PO) Take by mouth.   montelukast (SINGULAIR) 10 MG tablet Take 1 tablet (10 mg total) by mouth at bedtime.   Multiple Vitamin (MULTIVITAMIN) tablet Take 1 tablet by mouth daily.   TURMERIC PO Take by mouth.   No facility-administered encounter medications on file as of 11/07/2022.     There were no vitals filed for this visit. There is no height or weight on file  to calculate BMI.   PHYSICAL EXAM GENERAL:alert, no distress and comfortable SKIN: no rash  EYES: sclera clear NECK: without mass LYMPH:  no palpable cervical or supraclavicular lymphadenopathy  LUNGS: clear with normal breathing effort HEART: regular rate & rhythm, no lower extremity edema ABDOMEN: abdomen soft, non-tender and normal bowel sounds NEURO: alert & oriented x 3 with fluent speech, no focal motor/sensory deficits Breast exam:  PAC without erythema    CBC    Component Value Date/Time   WBC 7.0 09/08/2022 0855   RBC 4.23 09/08/2022 0855   HGB 13.5 09/08/2022 0855   HGB 14.1 05/09/2022 0840   HCT 39.8 09/08/2022 0855   PLT 321.0 09/08/2022 0855   PLT 324 05/09/2022 0840   MCV 94.1 09/08/2022 0855   MCH 32.9 05/09/2022 0840   MCHC 33.9 09/08/2022 0855   RDW 14.0 09/08/2022 0855   LYMPHSABS 1.4 09/08/2022 0855   MONOABS 0.4 09/08/2022  0855   EOSABS 0.1 09/08/2022 0855   BASOSABS 0.0 09/08/2022 0855     CMP     Component Value Date/Time   NA 141 09/08/2022 0855   K 4.2 09/08/2022 0855   CL 104 09/08/2022 0855   CO2 28 09/08/2022 0855   GLUCOSE 91 09/08/2022 0855   BUN 15 09/08/2022 0855   CREATININE 0.93 09/08/2022 0855   CREATININE 0.83 05/09/2022 0840   CALCIUM 9.8 09/08/2022 0855   PROT 6.9 09/08/2022 0855   ALBUMIN 4.4 09/08/2022 0855   AST 20 09/08/2022 0855   AST 28 05/09/2022 0840   ALT 17 09/08/2022 0855   ALT 23 05/09/2022 0840   ALKPHOS 80 09/08/2022 0855   BILITOT 0.5 09/08/2022 0855   BILITOT 0.6 05/09/2022 0840   GFRNONAA >60 05/09/2022 0840   GFRAA >60 12/10/2019 1225     ASSESSMENT & PLAN:Katherine Hanna is a 67 y.o. female with    1. Malignant neoplasm of upper-inner quadrant of right breast, Stage 1A, p(T1cN1aM0), ER+/PR+/HER2-, Grade II  -Diagnosed in 11/2019 with Invasive ductal carcinoma with DCIS.  S/p right lumpectomy and SLNB with Dr Donell Beers on 01/20/20. Her Mammaprint was low risk Luminal Type A. Adjuvant chemotherapy was not recommended given her low risk disease. She also completed adjuvant radiation.  -She did not have genetic testing.  -She began adjuvant antiestrogen therapy with Anastrozole in 04/2020.     2. Anxiety, Arthritis  -She was previously on Zoloft right before her cancer diagnosis.  -She lost her brother and a close aunt this year; she is doing ok with stable mood -She mainly has arthritis in her thumbs, right hand and right knee. Pain is -denies new/worsening pain   3. Osteoporosis  -Her 10/2017 DEXA showed osteoporosis at right femur neck with lowest T-score -2.8. -She was previously on prolia but did not tolerate it.  Switch to Zometa every 6 months x2 years starting 10/06/2020 after receiving dental clearance  -BMD increased from osteoporosis to osteopenia at the right femur neck, T score now -2.3 -Continue calcium, vitamin D, weightbearing exercise, and Zometa  (due 2nd dose today)   4.  Health maintenance -Encouraged her to remain up-to-date on age-appropriate healthcare and cancer screenings  -GI is Dr. Chales Abrahams      PLAN:  No orders of the defined types were placed in this encounter.     All questions were answered. The patient knows to call the clinic with any problems, questions or concerns. No barriers to learning were detected. I spent *** counseling the patient face to  face. The total time spent in the appointment was *** and more than 50% was on counseling, review of test results, and coordination of care.   Santiago Glad, NP-C @DATE @

## 2022-11-07 ENCOUNTER — Inpatient Hospital Stay: Payer: Medicare HMO | Attending: Nurse Practitioner

## 2022-11-07 ENCOUNTER — Encounter: Payer: Self-pay | Admitting: Nurse Practitioner

## 2022-11-07 ENCOUNTER — Other Ambulatory Visit: Payer: Self-pay

## 2022-11-07 ENCOUNTER — Inpatient Hospital Stay: Payer: Medicare HMO | Admitting: Nurse Practitioner

## 2022-11-07 ENCOUNTER — Inpatient Hospital Stay: Payer: Medicare HMO

## 2022-11-07 VITALS — BP 132/66 | HR 73 | Temp 98.0°F | Resp 18 | Ht 65.0 in | Wt 183.4 lb

## 2022-11-07 DIAGNOSIS — Z79811 Long term (current) use of aromatase inhibitors: Secondary | ICD-10-CM | POA: Insufficient documentation

## 2022-11-07 DIAGNOSIS — M816 Localized osteoporosis [Lequesne]: Secondary | ICD-10-CM

## 2022-11-07 DIAGNOSIS — Z170 Estrogen receptor positive status [ER+]: Secondary | ICD-10-CM | POA: Insufficient documentation

## 2022-11-07 DIAGNOSIS — C50211 Malignant neoplasm of upper-inner quadrant of right female breast: Secondary | ICD-10-CM | POA: Diagnosis not present

## 2022-11-07 DIAGNOSIS — R232 Flushing: Secondary | ICD-10-CM | POA: Diagnosis not present

## 2022-11-07 DIAGNOSIS — Z923 Personal history of irradiation: Secondary | ICD-10-CM | POA: Diagnosis not present

## 2022-11-07 DIAGNOSIS — M85851 Other specified disorders of bone density and structure, right thigh: Secondary | ICD-10-CM | POA: Insufficient documentation

## 2022-11-07 LAB — CBC WITH DIFFERENTIAL (CANCER CENTER ONLY)
Abs Immature Granulocytes: 0.01 10*3/uL (ref 0.00–0.07)
Basophils Absolute: 0 10*3/uL (ref 0.0–0.1)
Basophils Relative: 1 %
Eosinophils Absolute: 0.1 10*3/uL (ref 0.0–0.5)
Eosinophils Relative: 1 %
HCT: 42 % (ref 36.0–46.0)
Hemoglobin: 13.9 g/dL (ref 12.0–15.0)
Immature Granulocytes: 0 %
Lymphocytes Relative: 31 %
Lymphs Abs: 2 10*3/uL (ref 0.7–4.0)
MCH: 31.4 pg (ref 26.0–34.0)
MCHC: 33.1 g/dL (ref 30.0–36.0)
MCV: 95 fL (ref 80.0–100.0)
Monocytes Absolute: 0.4 10*3/uL (ref 0.1–1.0)
Monocytes Relative: 6 %
Neutro Abs: 3.8 10*3/uL (ref 1.7–7.7)
Neutrophils Relative %: 61 %
Platelet Count: 282 10*3/uL (ref 150–400)
RBC: 4.42 MIL/uL (ref 3.87–5.11)
RDW: 13.2 % (ref 11.5–15.5)
WBC Count: 6.3 10*3/uL (ref 4.0–10.5)
nRBC: 0 % (ref 0.0–0.2)

## 2022-11-07 LAB — CMP (CANCER CENTER ONLY)
ALT: 20 U/L (ref 0–44)
AST: 21 U/L (ref 15–41)
Albumin: 4.2 g/dL (ref 3.5–5.0)
Alkaline Phosphatase: 73 U/L (ref 38–126)
Anion gap: 8 (ref 5–15)
BUN: 12 mg/dL (ref 8–23)
CO2: 25 mmol/L (ref 22–32)
Calcium: 10 mg/dL (ref 8.9–10.3)
Chloride: 109 mmol/L (ref 98–111)
Creatinine: 0.74 mg/dL (ref 0.44–1.00)
GFR, Estimated: >60 mL/min (ref >60)
Glucose, Bld: 97 mg/dL (ref 70–99)
Potassium: 4 mmol/L (ref 3.5–5.1)
Sodium: 142 mmol/L (ref 135–145)
Total Bilirubin: 0.6 mg/dL (ref 0.3–1.2)
Total Protein: 7 g/dL (ref 6.5–8.1)

## 2022-11-07 NOTE — Assessment & Plan Note (Signed)
Stage 1A, p(T1cN1aM0), ER+/PR+/HER2-, Grade II  -Diagnosed in 11/2019 with Invasive ductal carcinoma with DCIS.  S/p right lumpectomy and SLNB with Dr Donell Beers on 01/20/20. Her Mammaprint was low risk Luminal Type A. Adjuvant chemotherapy was not recommended given her low risk disease. She also completed adjuvant radiation.  -She did not have genetic testing.  -She began adjuvant antiestrogen therapy with Anastrozole in 04/2020.  -Katherine Hanna is clinically doing well.  Tolerating anastrozole with occasional hot flash and no significant joint pain.  Breast exam is benign, labs are unremarkable.  Overall no clinical concern for breast cancer recurrence -Next [facility]ogram is already scheduled in July at [facility] -Continue breast cancer surveillance and anastrozole.   -Follow-up with [place] in 6 months, or sooner if needed

## 2022-11-07 NOTE — Assessment & Plan Note (Addendum)
-  Her 10/2017 DEXA showed osteoporosis at right femur neck with lowest T-score -2.8. -She was previously on prolia but did not tolerate it.  Switch to Zometa every 6 months x2 years starting 10/06/2020 after receiving dental clearance  -BMD increased from osteoporosis to osteopenia at the right femur neck, T score now -2.3 -Continue calcium, vitamin D, weightbearing exercise, she completed 2 years Zometa in December 2023.

## 2022-11-29 ENCOUNTER — Other Ambulatory Visit: Payer: Self-pay

## 2022-11-29 ENCOUNTER — Other Ambulatory Visit: Payer: Self-pay | Admitting: Nurse Practitioner

## 2022-11-29 DIAGNOSIS — C50211 Malignant neoplasm of upper-inner quadrant of right female breast: Secondary | ICD-10-CM

## 2022-11-29 NOTE — Progress Notes (Signed)
Order for US Breast complete uni right inc axilla EPIC faxed to [facility].  Fax confirmation received.

## 2022-12-05 DIAGNOSIS — M79672 Pain in left foot: Secondary | ICD-10-CM | POA: Diagnosis not present

## 2022-12-05 DIAGNOSIS — M21612 Bunion of left foot: Secondary | ICD-10-CM | POA: Diagnosis not present

## 2022-12-06 ENCOUNTER — Telehealth: Payer: Self-pay

## 2022-12-06 NOTE — Telephone Encounter (Signed)
Faxed a copy of signed order for Bone Density test as per Santiago Glad NP. Received fax confirmation. Placed original in the to be scanned file.

## 2022-12-08 DIAGNOSIS — M79672 Pain in left foot: Secondary | ICD-10-CM | POA: Diagnosis not present

## 2022-12-08 DIAGNOSIS — M84375A Stress fracture, left foot, initial encounter for fracture: Secondary | ICD-10-CM | POA: Diagnosis not present

## 2022-12-08 DIAGNOSIS — M779 Enthesopathy, unspecified: Secondary | ICD-10-CM | POA: Diagnosis not present

## 2022-12-08 DIAGNOSIS — M792 Neuralgia and neuritis, unspecified: Secondary | ICD-10-CM | POA: Diagnosis not present

## 2022-12-15 DIAGNOSIS — R200 Anesthesia of skin: Secondary | ICD-10-CM | POA: Diagnosis not present

## 2022-12-15 DIAGNOSIS — M25475 Effusion, left foot: Secondary | ICD-10-CM | POA: Diagnosis not present

## 2022-12-15 DIAGNOSIS — M19072 Primary osteoarthritis, left ankle and foot: Secondary | ICD-10-CM | POA: Diagnosis not present

## 2022-12-15 DIAGNOSIS — M2012 Hallux valgus (acquired), left foot: Secondary | ICD-10-CM | POA: Diagnosis not present

## 2022-12-15 DIAGNOSIS — M79672 Pain in left foot: Secondary | ICD-10-CM | POA: Diagnosis not present

## 2022-12-19 DIAGNOSIS — C50911 Malignant neoplasm of unspecified site of right female breast: Secondary | ICD-10-CM | POA: Diagnosis not present

## 2022-12-19 DIAGNOSIS — M8588 Other specified disorders of bone density and structure, other site: Secondary | ICD-10-CM | POA: Diagnosis not present

## 2022-12-19 DIAGNOSIS — Z8262 Family history of osteoporosis: Secondary | ICD-10-CM | POA: Diagnosis not present

## 2022-12-19 DIAGNOSIS — Z853 Personal history of malignant neoplasm of breast: Secondary | ICD-10-CM | POA: Diagnosis not present

## 2022-12-19 DIAGNOSIS — R92333 Mammographic heterogeneous density, bilateral breasts: Secondary | ICD-10-CM | POA: Diagnosis not present

## 2022-12-25 DIAGNOSIS — M779 Enthesopathy, unspecified: Secondary | ICD-10-CM | POA: Diagnosis not present

## 2023-01-25 DIAGNOSIS — M1990 Unspecified osteoarthritis, unspecified site: Secondary | ICD-10-CM | POA: Diagnosis not present

## 2023-01-25 DIAGNOSIS — Z880 Allergy status to penicillin: Secondary | ICD-10-CM | POA: Diagnosis not present

## 2023-01-25 DIAGNOSIS — Z809 Family history of malignant neoplasm, unspecified: Secondary | ICD-10-CM | POA: Diagnosis not present

## 2023-01-25 DIAGNOSIS — R32 Unspecified urinary incontinence: Secondary | ICD-10-CM | POA: Diagnosis not present

## 2023-01-25 DIAGNOSIS — Z79899 Other long term (current) drug therapy: Secondary | ICD-10-CM | POA: Diagnosis not present

## 2023-01-25 DIAGNOSIS — Z008 Encounter for other general examination: Secondary | ICD-10-CM | POA: Diagnosis not present

## 2023-01-25 DIAGNOSIS — Z79811 Long term (current) use of aromatase inhibitors: Secondary | ICD-10-CM | POA: Diagnosis not present

## 2023-01-25 DIAGNOSIS — H269 Unspecified cataract: Secondary | ICD-10-CM | POA: Diagnosis not present

## 2023-01-25 DIAGNOSIS — I10 Essential (primary) hypertension: Secondary | ICD-10-CM | POA: Diagnosis not present

## 2023-01-25 DIAGNOSIS — C50919 Malignant neoplasm of unspecified site of unspecified female breast: Secondary | ICD-10-CM | POA: Diagnosis not present

## 2023-01-25 DIAGNOSIS — Z823 Family history of stroke: Secondary | ICD-10-CM | POA: Diagnosis not present

## 2023-01-25 DIAGNOSIS — Z7951 Long term (current) use of inhaled steroids: Secondary | ICD-10-CM | POA: Diagnosis not present

## 2023-01-25 DIAGNOSIS — M8580 Other specified disorders of bone density and structure, unspecified site: Secondary | ICD-10-CM | POA: Diagnosis not present

## 2023-01-29 ENCOUNTER — Ambulatory Visit: Payer: Medicare HMO | Attending: General Surgery

## 2023-01-29 VITALS — Wt 176.5 lb

## 2023-01-29 DIAGNOSIS — Z483 Aftercare following surgery for neoplasm: Secondary | ICD-10-CM

## 2023-01-29 NOTE — Therapy (Signed)
OUTPATIENT PHYSICAL THERAPY SOZO SCREENING NOTE   Patient Name: Katherine Hanna MRN: [id number] DOB:03-22-1956, 67 y.o., female Today's Date: 01/29/2023  PCP: Donato Schultz, DO REFERRING PROVIDER: Almond Lint, MD   PT End of Session - 01/29/23 0809     Visit Number 2   # unchanged due to screen only   PT Start Time 0807    PT Stop Time 0811    PT Time Calculation (min) 4 min    Activity Tolerance Patient tolerated treatment well    Behavior During Therapy WFL for tasks assessed/performed             Past Medical History:  Diagnosis Date   Allergic rhinitis    Arthritis    knee   Asthma    Breast cancer (HCC)    History of chicken pox    Migraine    Rheumatic fever    as a child.   Past Surgical History:  Procedure Laterality Date   APPENDECTOMY  11/1971   BREAST LUMPECTOMY WITH RADIOACTIVE SEED AND SENTINEL LYMPH NODE BIOPSY Right 01/20/2020   Procedure: RIGHT BREAST LUMPECTOMY WITH RADIOACTIVE SEED AND SENTINEL LYMPH NODE BIOPSY;  Surgeon: Almond Lint, MD;  Location: [facility];  Service: General;  Laterality: Right;   COLONOSCOPY     TONSILLECTOMY     when she was 2 or 3   Patient Active Problem List   Diagnosis Date Noted   Moderate persistent asthmatic bronchitis with exacerbation 04/11/2022   URI (upper respiratory infection) 04/04/2022   Bronchitis 04/18/2021   Malignant neoplasm of upper-inner quadrant of right breast in female, estrogen receptor positive (HCC) 12/08/2019   Seasonal allergies 07/30/2017   Need for diphtheria-tetanus-pertussis (Tdap) vaccine 07/30/2017   Vitamin D deficiency 06/28/2016   Osteoporosis 06/18/2015   Asthma with bronchitis 06/26/2014   Food allergy 02/28/2012   Depression 10/11/2010   Preventative health care 10/05/2010   Screening for malignant neoplasm of the cervix 10/05/2010   COMMON MIGRAINE 08/31/2009   Allergic rhinitis 08/31/2009   CERVICAL POLYP 08/31/2009   ARTHRITIS 08/31/2009    POSTMENOPAUSAL STATUS 08/31/2009    REFERRING DIAG: right breast cancer at risk for lymphedema  THERAPY DIAG: Aftercare following surgery for neoplasm  PERTINENT HISTORY: Patient was diagnosed on 11/10/2019 with right grade II invasive ductal carcinoma breast cancer. Patient underwent a right lumpectomy and sentinel node biopsy on 01/20/2020 with 1/4 positive axillary lymph nodes. It is ER/PR positive and HER2 negative with a Ki67 of 10%.   PRECAUTIONS: right UE Lymphedema risk, None  SUBJECTIVE: Pt returns for her 6 month L-Dex   PAIN:  Are you having pain? No  SOZO SCREENING: Patient was assessed today using the SOZO machine to determine the lymphedema index score. This was compared to her baseline score. It was determined that she is within the recommended range when compared to her baseline and no further action is needed at this time. She will continue SOZO screenings. These are done every 3 months for 2 years post operatively followed by every 6 months for 2 years, and then annually.   L-DEX FLOWSHEETS - 01/29/23 0800       L-DEX LYMPHEDEMA SCREENING   Measurement Type Unilateral    L-DEX MEASUREMENT EXTREMITY Upper Extremity    POSITION  Standing    DOMINANT SIDE Right    At Risk Side Right    BASELINE SCORE (UNILATERAL) 4.3    L-DEX SCORE (UNILATERAL) 6.7    VALUE CHANGE (UNILAT) 2.4  Hermenia Bers, PTA 01/29/2023, 8:11 AM

## 2023-02-03 ENCOUNTER — Other Ambulatory Visit: Payer: Self-pay | Admitting: Pulmonary Disease

## 2023-02-03 DIAGNOSIS — J310 Chronic rhinitis: Secondary | ICD-10-CM

## 2023-05-08 ENCOUNTER — Other Ambulatory Visit: Payer: Self-pay

## 2023-05-08 DIAGNOSIS — Z170 Estrogen receptor positive status [ER+]: Secondary | ICD-10-CM

## 2023-05-08 NOTE — Progress Notes (Unsigned)
Patient Care Team: [name], [name], DO as PCP - General (Family Medicine) Hollar, Ronal Fear, MD as Referring Physician (Dermatology) [name], OD as Consulting Physician (Optometry) [name], MD as Consulting Physician (Hematology) [name], Katherine Hanna as Nurse Practitioner (Nurse Practitioner) [name], MD as Consulting Physician (Pulmonary Disease) [name], MD as Consulting Physician (Gastroenterology) Mammography, [facility] as Radiologist (Diagnostic Radiology)  Clinic Day:  05/09/2023  Referring physician: [name], [name], *  ASSESSMENT & PLAN:   Assessment & Plan: Malignant neoplasm of upper-inner quadrant of right breast in female, estrogen receptor positive (HCC) -Diagnosed in 11/2019 with Invasive ductal carcinoma with DCIS.  S/p right lumpectomy and SLNB with Dr Donell Beers on 01/20/20. Her Mammaprint was low risk Luminal Type A. Adjuvant chemotherapy was not recommended given her low risk disease. She also completed adjuvant radiation.  -She did not have genetic testing.  -She began adjuvant antiestrogen therapy with Anastrozole in 04/2020, tolerating well except hot flashes but manageable.  -Ms. Turnier is clinically doing well. Breast exam shows firm scar tissue in the right breat and likely dense fibroglandular in the lower inner breast.  No strong clinical concern for recurrence, but will change her screening mammogram to a diagnostic mammogram.  Was to be done in July 2024, but no imaging report available to review. -Continue breast cancer surveillance and anastrozole -If mammogram is negative, follow-up with me in 6 months, or sooner if needed   Plan: Labs reviewed  -CBC showing WBC 5.8; Hgb 13.8; Hct 42.1; Plt 308; Anc 3.4 -CMP - K 4.3; glucose 95; BUN 11; Creatinine 0.86; eGFR > 60; Ca 9.9; LFTs normal.   Will get recent mammogram and bone density reports from [facility] mammography.  Will send patient a MyChart message regarding results and  further treatment plan.  Patient does have history of osteoporosis.  She did get 4 infusions of Zometa previously.  She has taken Fosamax which showed no improvement of osteoporosis.  She was on Prolia injections.  This caused her to have calcium deposits in her breasts.  Would consider something like Boniva if bone density still showing osteoporosis.  She is agreeable to try. Continue anastrozole daily. Labs and follow-up in 6 months.  The patient understands the plans discussed today and is in agreement with them.  She knows to contact our office if she develops concerns prior to her next appointment.  I provided 25 minutes of face-to-face time during this encounter and > 50% was spent counseling as documented under my assessment and plan.    Katherine Hanna, Katherine Hanna  [facility] - A DEPT [facility] [address] [place] [address] Dept: [phone] Dept Fax: [phone]   No orders of the defined types were placed in this encounter.     CHIEF COMPLAINT:  CC: Right breast cancer, estrogen receptor positive  Current Treatment: Anastrozole 1 mg daily starting 04/2020   INTERVAL HISTORY:  [name] is here today for repeat clinical assessment.  She last saw [name], Katherine Hanna on 11/07/2022.  Screening mammogram had been changed to diagnostic mammogram with ultrasound of the right breast due to firm scar tissue, likely dense fibroglandular tissue in the lower inner breast.  She has not the study done in July.  We need to get results from [facility] mammography.  The patient reports having moderate hot flashes on anastrozole, but otherwise tolerating this okay.  Willing to try Boniva if  DEXA scan positive for osteoporosis.  She denies chest pain, chest pressure, or shortness of breath. She denies headaches or visual disturbances. She denies abdominal pain, nausea, vomiting, or changes in bowel or bladder habits.  She denies fevers or chills.  She denies pain. Her appetite is good. Her weight has been stable.  I have reviewed the past medical history, past surgical history, social history and family history with the patient and they are unchanged from previous note.  ALLERGIES:  is allergic to penicillins.  MEDICATIONS:  Current Outpatient Medications  Medication Sig Dispense Refill   albuterol (VENTOLIN HFA) 108 (90 Base) MCG/ACT inhaler Inhale 2 puffs into the lungs every 6 (six) hours as needed for wheezing or shortness of breath. 8 g 0   anastrozole (ARIMIDEX) 1 MG tablet TAKE 1 TABLET BY MOUTH EVERY DAY 90 tablet 3   benzonatate (TESSALON) 200 MG capsule Take 1 capsule (200 mg total) by mouth 3 (three) times daily as needed for cough. 30 capsule 1   Calcium Carbonate-Vitamin D (CALCIUM 500 + D PO) Take 1 tablet by mouth 2 (two) times daily.     cetirizine (ZYRTEC) 10 MG tablet Take 10 mg by mouth at bedtime.     co-enzyme Q-10 30 MG capsule Take 30 mg by mouth daily.     fluticasone (FLONASE) 50 MCG/ACT nasal spray SPRAY 2 SPRAYS INTO EACH NOSTRIL EVERY DAY 48 mL 1   fluticasone-salmeterol (WIXELA INHUB) 500-50 MCG/ACT AEPB Inhale 1 puff into the lungs in the morning and at bedtime. 60 each 5   HYDROcodone bit-homatropine (HYCODAN) 5-1.5 MG/5ML syrup Take 5 mLs by mouth every 6 (six) hours as needed for cough. 240 mL 0   Misc Natural Products (TART CHERRY ADVANCED PO) Take by mouth.     montelukast (SINGULAIR) 10 MG tablet Take 1 tablet (10 mg total) by mouth at bedtime. 30 tablet 11   Multiple Vitamin (MULTIVITAMIN) tablet Take 1 tablet by mouth daily.     TURMERIC PO Take by mouth.     No current facility-administered medications for this visit.    HISTORY OF PRESENT ILLNESS:   Oncology History Overview Note  Cancer Staging Malignant neoplasm of upper-inner quadrant of right breast in female, estrogen receptor positive (HCC) Staging form: Breast, AJCC 8th Edition - Clinical stage from 12/03/2019: Stage IA (cT1c, cN0,  cM0, G2, ER+, PR+, HER2-) - Signed by [name], MD on 12/09/2019 - Pathologic stage from 01/20/2020: Stage IA (pT1c, pN1a, cM0, G2, ER+, PR+, HER2-) - Signed by [name], MD on 04/06/2020    Malignant neoplasm of upper-inner quadrant of right breast in female, estrogen receptor positive (HCC)  11/25/2019 Mammogram   Mammogram and [place] 11/25/19  IMPRESSION The 1.1x0.8x0.9cm architectural distortion in the right breast at 3:00 position middle depth 4 cm from nipple is highly suggestive of malignancy. An US guided biopsy is recommended.     12/03/2019 Initial Biopsy   Diagnosis 12/03/19 Breast, right, needle core biopsy, 3 o'clock, 4 cmfn - INVASIVE DUCTAL CARCINOMA. - DUCTAL CARCINOMA IN SITU. Microscopic Comment The carcinoma appears grade 2. The greatest linear extent of tumor in any one core is 10 mm. Ancillary studies will be reported separately. Results reported to [employer] on 12/04/2019. Intradepartmental consultation (Dr. Charm Barges).   12/03/2019 Receptors her2   PROGNOSTIC INDICATORS Results: IMMUNOHISTOCHEMICAL AND MORPHOMETRIC ANALYSIS PERFORMED MANUALLY The tumor cells are EQUIVOCAL for Her2 (2+). Her2 by FISH will be performed and results reported separately. Estrogen Receptor: 95%, POSITIVE, STRONG STAINING  INTENSITY Progesterone Receptor: 80%, POSITIVE, STRONG STAINING INTENSITY Proliferation Marker Ki67: 10%   FLUORESCENCE IN-SITU HYBRIDIZATION Results: GROUP 5: HER2 **NEGATIVE** Equivocal form of amplification of the HER2 gene was detected in the IHC 2+ tissue sample received from this individual. HER2 FISH was performed by a technologist and cell imaging and analysis on the [employer].   12/03/2019 Cancer Staging   Staging form: Breast, AJCC 8th Edition - Clinical stage from 12/03/2019: Stage IA (cT1c, cN0, cM0, G2, ER+, PR+, HER2-) - Signed by [name], MD on 12/09/2019   12/08/2019 Initial Diagnosis   Malignant neoplasm of upper-inner quadrant of right breast  in female, estrogen receptor positive (HCC)   01/20/2020 Surgery   RIGHT BREAST LUMPECTOMY WITH RADIOACTIVE SEED AND SENTINEL LYMPH NODE BIOPSY by Donell Beers    01/20/2020 Pathology Results   FINAL MICROSCOPIC DIAGNOSIS:   A. BREAST, RIGHT, LUMPECTOMY:  - Invasive ductal carcinoma, grade 2, spanning 1.3 cm.  - Intermediate grade ductal carcinoma in situ.  - Invasive carcinoma is <1 mm from the lateral margin focally.  - In situ carcinoma is 3 mm from the lateral margin focally.  - Biopsy site.  - Fibrocystic change and usual ductal hyperplasia.  - See oncology table.   B. LYMPH NODE, RIGHT AXILLARY #1, SENTINEL, EXCISION:  - Metastatic carcinoma in one of one lymph nodes (1/1).  - Focal extracapsular extension.   C. LYMPH NODE, RIGHT AXILLARY #2, SENTINEL, EXCISION:  - One of one lymph nodes negative for carcinoma (0/1).   D. LYMPH NODE, RIGHT AXILLARY #3, SENTINEL, EXCISION:  - One of one lymph nodes negative for carcinoma (0/1).   E. LYMPH NODE, RIGHT AXILLARY #4, SENTINEL, EXCISION:  - One of one lymph nodes negative for carcinoma (0/1).     ADDENDUM:   By immunohistochemistry, HER-2 is EQUIVOCAL (2+).  HER-2 by FISH is  pending and will be reported in an addendum.   ADDENDUM:   FLOURESCENCE IN-SITU HYBRIDIZATION RESULTS:   GROUP 5:   HER2 **NEGATIVE**    01/20/2020 Miscellaneous   Mammaprint Low risk Luminal Type A - MPI at +0.311 She has 97.8% benefit of Horomal Therpay alone    01/20/2020 Cancer Staging   Staging form: Breast, AJCC 8th Edition - Pathologic stage from 01/20/2020: Stage IA (pT1c, pN1a, cM0, G2, ER+, PR+, HER2-) - Signed by [name], MD on 04/06/2020   02/26/2020 - 04/13/2020 Radiation Therapy   Adjuvant Radiation with Dr Roselind Messier    04/2020 -  Anti-estrogen oral therapy   Anastrozole 1mg  daily starting in 04/2020    07/13/2020 Survivorship   SCP delivered virtually by Santiago Glad, Katherine Hanna        REVIEW OF SYSTEMS:   Constitutional: Denies fevers,  chills or abnormal weight loss.  She continues to have moderate hot flashes. Eyes: Denies blurriness of vision Ears, nose, mouth, throat, and face: Denies mucositis or sore throat Respiratory: Denies cough, dyspnea or wheezes Cardiovascular: Denies palpitation, chest discomfort or lower extremity swelling Gastrointestinal:  Denies nausea, heartburn or change in bowel habits Skin: Denies abnormal skin rashes Lymphatics: Denies new lymphadenopathy or easy bruising Neurological:Denies numbness, tingling or new weaknesses Behavioral/Psych: Mood is stable, no new changes  All other systems were reviewed with the patient and are negative.   VITALS:   Today's Vitals   05/09/23 1005 05/09/23 1006  BP: (!) 143/66   Pulse: 68   Resp: 18   Temp: 97.8 F (36.6 C)   TempSrc: Tympanic   SpO2: 98%   Weight: 177  lb 1.6 oz (80.3 kg)   PainSc:  0-No pain   Body mass index is 29.47 kg/m.   Wt Readings from Last 3 Encounters:  05/09/23 177 lb 1.6 oz (80.3 kg)  01/29/23 176 lb 8 oz (80.1 kg)  11/07/22 183 lb 6.4 oz (83.2 kg)    Body mass index is 29.47 kg/m.  Performance status (ECOG): 1 - Symptomatic but completely ambulatory  PHYSICAL EXAM:   GENERAL:alert, no distress and comfortable SKIN: skin color, texture, turgor are normal, no rashes or significant lesions EYES: normal, Conjunctiva are pink and non-injected, sclera clear OROPHARYNX:no exudate, no erythema and lips, buccal mucosa, and tongue normal  NECK: supple, thyroid normal size, non-tender, without nodularity LYMPH:  no palpable lymphadenopathy in the cervical, axillary or inguinal LUNGS: clear to auscultation and percussion with normal breathing effort HEART: regular rate & rhythm and no murmurs and no lower extremity edema ABDOMEN:abdomen soft, non-tender and normal bowel sounds Musculoskeletal:no cyanosis of digits and no clubbing  NEURO: alert & oriented x 3 with fluent speech, no focal motor/sensory deficits BREAST:  Right breast with firm, fibroglandular tissue along the inner, lower quadrant.  There are no discrete nodules or masses.  There is no nipple inversion or discharge.  No tenderness with palpation.  Negative for right axillary lymphadenopathy.  Left breast is without masses or lumps.  No nipple inversion or discharge.  No tenderness with palpation.  There is no axillary lymphadenopathy on the left side.  LABORATORY DATA:  I have reviewed the data as listed    Component Value Date/Time   NA 143 05/09/2023 0838   K 4.3 05/09/2023 0838   CL 108 05/09/2023 0838   CO2 27 05/09/2023 0838   GLUCOSE 95 05/09/2023 0838   BUN 11 05/09/2023 0838   CREATININE 0.86 05/09/2023 0838   CALCIUM 9.9 05/09/2023 0838   PROT 7.0 05/09/2023 0838   ALBUMIN 4.4 05/09/2023 0838   AST 17 05/09/2023 0838   ALT 14 05/09/2023 0838   ALKPHOS 87 05/09/2023 0838   BILITOT 0.5 05/09/2023 0838   GFRNONAA >60 05/09/2023 0838   GFRAA >60 12/10/2019 1225     Lab Results  Component Value Date   WBC 5.8 05/09/2023   NEUTROABS 3.4 05/09/2023   HGB 13.8 05/09/2023   HCT 42.1 05/09/2023   MCV 94.4 05/09/2023   PLT 308 05/09/2023

## 2023-05-08 NOTE — Assessment & Plan Note (Signed)
-  Diagnosed in 11/2019 with Invasive ductal carcinoma with DCIS.  S/p right lumpectomy and SLNB with Dr Donell Beers on 01/20/20. Her Mammaprint was low risk Luminal Type A. Adjuvant chemotherapy was not recommended given her low risk disease. She also completed adjuvant radiation.  -She did not have genetic testing.  -She began adjuvant antiestrogen therapy with Anastrozole in 04/2020, tolerating well except hot flashes but manageable.  -Katherine Hanna is clinically doing well. Breast exam shows firm scar tissue in the right breat and likely dense fibroglandular in the lower inner breast.  No strong clinical concern for recurrence, but will change her screening mammogram to a diagnostic mammogram.  Was to be done in July 2024.  Will need to get imaging report from [facility] mammography. -Continue breast cancer surveillance and anastrozole -If mammogram is negative, follow-up in 6 months with labs, or sooner if needed

## 2023-05-09 ENCOUNTER — Inpatient Hospital Stay: Payer: Medicare HMO | Admitting: Nurse Practitioner

## 2023-05-09 ENCOUNTER — Inpatient Hospital Stay: Payer: Medicare HMO | Attending: Nurse Practitioner

## 2023-05-09 ENCOUNTER — Encounter: Payer: Self-pay | Admitting: Nurse Practitioner

## 2023-05-09 VITALS — BP 143/66 | HR 68 | Temp 97.8°F | Resp 18 | Wt 177.1 lb

## 2023-05-09 DIAGNOSIS — Z923 Personal history of irradiation: Secondary | ICD-10-CM | POA: Insufficient documentation

## 2023-05-09 DIAGNOSIS — Z170 Estrogen receptor positive status [ER+]: Secondary | ICD-10-CM

## 2023-05-09 DIAGNOSIS — Z79811 Long term (current) use of aromatase inhibitors: Secondary | ICD-10-CM | POA: Diagnosis not present

## 2023-05-09 DIAGNOSIS — Z1721 Progesterone receptor positive status: Secondary | ICD-10-CM | POA: Diagnosis not present

## 2023-05-09 DIAGNOSIS — Z1732 Human epidermal growth factor receptor 2 negative status: Secondary | ICD-10-CM | POA: Diagnosis not present

## 2023-05-09 DIAGNOSIS — C50211 Malignant neoplasm of upper-inner quadrant of right female breast: Secondary | ICD-10-CM | POA: Diagnosis not present

## 2023-05-09 DIAGNOSIS — R232 Flushing: Secondary | ICD-10-CM | POA: Diagnosis not present

## 2023-05-09 LAB — CMP (CANCER CENTER ONLY)
ALT: 14 U/L (ref 0–44)
AST: 17 U/L (ref 15–41)
Albumin: 4.4 g/dL (ref 3.5–5.0)
Alkaline Phosphatase: 87 U/L (ref 38–126)
Anion gap: 8 (ref 5–15)
BUN: 11 mg/dL (ref 8–23)
CO2: 27 mmol/L (ref 22–32)
Calcium: 9.9 mg/dL (ref 8.9–10.3)
Chloride: 108 mmol/L (ref 98–111)
Creatinine: 0.86 mg/dL (ref 0.44–1.00)
GFR, Estimated: >60 mL/min (ref >60)
Glucose, Bld: 95 mg/dL (ref 70–99)
Potassium: 4.3 mmol/L (ref 3.5–5.1)
Sodium: 143 mmol/L (ref 135–145)
Total Bilirubin: 0.5 mg/dL (ref <1.2)
Total Protein: 7 g/dL (ref 6.5–8.1)

## 2023-05-09 LAB — CBC WITH DIFFERENTIAL (CANCER CENTER ONLY)
Abs Immature Granulocytes: 0.02 10*3/uL (ref 0.00–0.07)
Basophils Absolute: 0 10*3/uL (ref 0.0–0.1)
Basophils Relative: 1 %
Eosinophils Absolute: 0.1 10*3/uL (ref 0.0–0.5)
Eosinophils Relative: 2 %
HCT: 42.1 % (ref 36.0–46.0)
Hemoglobin: 13.8 g/dL (ref 12.0–15.0)
Immature Granulocytes: 0 %
Lymphocytes Relative: 32 %
Lymphs Abs: 1.9 10*3/uL (ref 0.7–4.0)
MCH: 30.9 pg (ref 26.0–34.0)
MCHC: 32.8 g/dL (ref 30.0–36.0)
MCV: 94.4 fL (ref 80.0–100.0)
Monocytes Absolute: 0.4 10*3/uL (ref 0.1–1.0)
Monocytes Relative: 6 %
Neutro Abs: 3.4 10*3/uL (ref 1.7–7.7)
Neutrophils Relative %: 59 %
Platelet Count: 308 10*3/uL (ref 150–400)
RBC: 4.46 MIL/uL (ref 3.87–5.11)
RDW: 12.7 % (ref 11.5–15.5)
WBC Count: 5.8 10*3/uL (ref 4.0–10.5)
nRBC: 0 % (ref 0.0–0.2)

## 2023-05-10 ENCOUNTER — Encounter: Payer: Self-pay | Admitting: Pulmonary Disease

## 2023-05-10 ENCOUNTER — Ambulatory Visit: Payer: Medicare HMO | Admitting: Pulmonary Disease

## 2023-05-10 VITALS — BP 128/62 | HR 68 | Temp 98.1°F | Ht 65.0 in | Wt 176.8 lb

## 2023-05-10 DIAGNOSIS — J3089 Other allergic rhinitis: Secondary | ICD-10-CM | POA: Diagnosis not present

## 2023-05-10 DIAGNOSIS — J4540 Moderate persistent asthma, uncomplicated: Secondary | ICD-10-CM

## 2023-05-10 MED ORDER — FLUTICASONE-SALMETEROL 250-50 MCG/ACT IN AEPB: 1.0000 | INHALATION_SPRAY | Freq: Two times a day (BID) | RESPIRATORY_TRACT | 11 refills | Status: DC

## 2023-05-10 NOTE — Patient Instructions (Signed)
We will decrease your wixella dose to 250-75mcg 1 puff twice daily from the 500-39mcg 1 puff daily - rinse mouth out after each use  Stop singulair due to side effects  Continue zyrtec daily 10mg  daily  Use flonase nasal spray, 1 spray per nostril at bedtime  Follow up in 1 year, call sooner if needed.

## 2023-05-10 NOTE — Progress Notes (Signed)
Synopsis: Referred in January 2023 for bronchitis by Barry Dienes, DO  Subjective:   PATIENT ID: Katherine Hanna GENDER: female DOB: 20-Oct-1955, MRN: [id number]  HPI  Chief Complaint  Patient presents with   Follow-up    Sob occass. Cough in am yellow, denies wheeze   Katherine Hanna is a 67 year old woman, never smoker with history of breast cancer and allergic rhinitis who returns to pulmonary clinic for asthma.  They report significant sedation from the Singulair, to the point where they only take half a tablet infrequently due to the severity of the side effects. They also report using Wixela 500 once at night, along with Flonase, and have not been experiencing any wheezing, cough, or shortness of breath. They note that their symptoms have been well controlled this year, a significant improvement from the past two years. They also take Zyrtec daily for allergies. The patient reports occasional shortness of breath when anxious, but denies any chest tightness or wheezing. They have noticed some morning puffiness, which they attribute to changes in the weather.  OV 09/26/22 She was seen multiple times since last visit by Rhunette Croft, NP due to URIs with most recent visit 06/07/22 where she was feeling better after course of steroids. Her asthma regimen includes wixela inhub 500-43mcg 1 puff twice daily and as needed albuterol. She is taking zyrtec for allergies.  Her breathing is ok at this time. She reports some mild issues with her breathing after being outside with chest tightness due to allergies.   OV 01/16/22 PFTs today show normal lung function  She has not required advair HFA 115-44mcg 2 puffs twice daily with spacer over the past 2 months. She denies issues with wheezing, cough or dyspnea at this time.   OV 09/08/21 She was transitioned from flovent to advair HFA 115-80mcg 2 puffs twice daily with spacer at last visit. She tried to taper down to 1 puff twice a day but reports a  return of her symptoms. Once resuming 2 puffs twice daily she felt better. She is planning to continue the inhaler through out the spring pollen season and then attempt to taper off once again.  She has noticed intermittent blood tinged nasal secretions. She has occasional post-nasal drainage.  OV 05/31/21 She reports developing upper respiratory viral infection 9 to 10 weeks ago. She tested negative for covid at that time. She thought she had the flu. She had cough, wheezing and shortness of breath along with fatigue. She was initially treated with a zpak without much improvement. She was then treated with a course of prednisone and another zpak with some improvement. She completed 2 more rounds of antibiotics and another round of prednisone with some improvement and was then placed on flovent 2 puffs twice daily with spacer and PRN albuterol inhaler. She reports the cough is much better at this time and is not experiencing wheezing now. She does have some sinus congestion with post nasal drainage. Denies significant reflux symptoms. She is taking zyrtec for seasonal allergies.   She reports having intermittent wheezing with previous episodes of viral infections that did not take this long to resolve.   She had CT scan 11/29 which showed anterior peripheral fibrotic changes of the right upper lobe secondary to breast radiation. She has minimal mucous plugging of the right lower lobe distal airways. No opacities or infusions.   Past Medical History:  Diagnosis Date   Allergic rhinitis    Arthritis  knee   Asthma    Breast cancer (HCC)    History of chicken pox    Migraine    Rheumatic fever    as a child.     Family History  Problem Relation Age of Onset   Lung cancer Mother        smoked   Diabetes Father    Stroke Father    Cancer Father        bladder   Leukemia Father    Stroke Brother    Stroke Maternal Grandmother        40s   Coronary artery disease Maternal  Grandfather    Stroke Paternal Grandmother    Colon cancer Maternal Uncle    Breast cancer Paternal Aunt    Esophageal cancer Neg Hx      Social History   Socioeconomic History   Marital status: Married    Spouse name: Not on file   Number of children: 2   Years of education: Not on file   Highest education level: Not on file  Occupational History   Occupation: IT trainer  Tobacco Use   Smoking status: Never   Smokeless tobacco: Never  Vaping Use   Vaping status: Never Used  Substance and Sexual Activity   Alcohol use: No   Drug use: No   Sexual activity: Yes    Partners: Male  Other Topics Concern   Not on file  Social History Narrative   Exercise---   chair exercise, line dancing class    Social Drivers of Health   Financial Resource Strain: Low Risk  (08/11/2021)   Overall Financial Resource Strain (CARDIA)    Difficulty of Paying Living Expenses: Not hard at all  Food Insecurity: No Food Insecurity (08/15/2022)   Hunger Vital Sign    Worried About Running Out of Food in the Last Year: Never true    Ran Out of Food in the Last Year: Never true  Transportation Needs: No Transportation Needs (08/15/2022)   PRAPARE - Administrator, Civil Service (Medical): No    Lack of Transportation (Non-Medical): No  Physical Activity: Sufficiently Active (08/15/2022)   Exercise Vital Sign    Days of Exercise per Week: 3 days    Minutes of Exercise per Session: 90 min  Stress: No Stress Concern Present (08/11/2021)   [employer] of Occupational Health - Occupational Stress Questionnaire    Feeling of Stress : Not at all  Social Connections: Moderately Integrated (08/11/2021)   Social Connection and Isolation Panel [NHANES]    Frequency of Communication with Friends and Family: More than three times a week    Frequency of Social Gatherings with Friends and Family: Once a week    Attends Religious Services: More than 4 times per year    Active Member of [employer] or  Organizations: No    Attends Banker Meetings: Never    Marital Status: Married  Catering manager Violence: Not At Risk (08/15/2022)   Humiliation, Afraid, Rape, and Kick questionnaire    Fear of Current or Ex-Partner: No    Emotionally Abused: No    Physically Abused: No    Sexually Abused: No     Allergies  Allergen Reactions   Penicillins Rash    Rash head to toe     Outpatient Medications Prior to Visit  Medication Sig Dispense Refill   anastrozole (ARIMIDEX) 1 MG tablet TAKE 1 TABLET BY MOUTH EVERY DAY 90 tablet 3   benzonatate (  TESSALON) 200 MG capsule Take 1 capsule (200 mg total) by mouth 3 (three) times daily as needed for cough. 30 capsule 1   Calcium Carbonate-Vitamin D (CALCIUM 500 + D PO) Take 1 tablet by mouth 2 (two) times daily.     cetirizine (ZYRTEC) 10 MG tablet Take 10 mg by mouth at bedtime.     co-enzyme Q-10 30 MG capsule Take 30 mg by mouth daily.     fluticasone (FLONASE) 50 MCG/ACT nasal spray SPRAY 2 SPRAYS INTO EACH NOSTRIL EVERY DAY 48 mL 1   HYDROcodone bit-homatropine (HYCODAN) 5-1.5 MG/5ML syrup Take 5 mLs by mouth every 6 (six) hours as needed for cough. 240 mL 0   Misc Natural Products (TART CHERRY ADVANCED PO) Take by mouth.     Multiple Vitamin (MULTIVITAMIN) tablet Take 1 tablet by mouth daily.     TURMERIC PO Take by mouth.     fluticasone-salmeterol (WIXELA INHUB) 500-50 MCG/ACT AEPB Inhale 1 puff into the lungs in the morning and at bedtime. (Patient taking differently: Inhale 1 puff into the lungs in the morning and at bedtime. Using one time a day) 60 each 5   montelukast (SINGULAIR) 10 MG tablet Take 1 tablet (10 mg total) by mouth at bedtime. 30 tablet 11   albuterol (VENTOLIN HFA) 108 (90 Base) MCG/ACT inhaler Inhale 2 puffs into the lungs every 6 (six) hours as needed for wheezing or shortness of breath. (Patient not taking: Reported on 05/10/2023) 8 g 0   No facility-administered medications prior to visit.   Review of  Systems  Constitutional:  Negative for chills, fever, malaise/fatigue and weight loss.  HENT:  Negative for congestion, sinus pain and sore throat.   Eyes: Negative.   Respiratory:  Negative for cough, hemoptysis, sputum production, shortness of breath and wheezing.   Cardiovascular:  Negative for chest pain, palpitations, orthopnea, claudication and leg swelling.  Gastrointestinal:  Negative for abdominal pain, heartburn, nausea and vomiting.  Genitourinary: Negative.   Musculoskeletal:  Negative for joint pain and myalgias.  Skin:  Negative for rash.  Neurological:  Negative for weakness.  Endo/Heme/Allergies:  Positive for environmental allergies.  Psychiatric/Behavioral: Negative.      Objective:   Vitals:   05/10/23 1031  BP: 128/62  Pulse: 68  Temp: 98.1 F (36.7 C)  TempSrc: Temporal  SpO2: 99%  Weight: 176 lb 12.8 oz (80.2 kg)  Height: 5\' 5"  (1.651 m)    Physical Exam Constitutional:      General: She is not in acute distress.    Appearance: She is not ill-appearing.  HENT:     Head: Normocephalic and atraumatic.  Eyes:     General: No scleral icterus.    Conjunctiva/sclera: Conjunctivae normal.  Cardiovascular:     Rate and Rhythm: Normal rate and regular rhythm.     Pulses: Normal pulses.     Heart sounds: Normal heart sounds. No murmur heard. Pulmonary:     Effort: Pulmonary effort is normal.     Breath sounds: Normal breath sounds. No wheezing, rhonchi or rales.  Musculoskeletal:     Right lower leg: No edema.     Left lower leg: No edema.  Skin:    General: Skin is warm and dry.  Neurological:     General: No focal deficit present.     Mental Status: She is alert.    CBC    Component Value Date/Time   WBC 5.8 05/09/2023 0838   WBC 7.0 09/08/2022 0855   RBC  4.46 05/09/2023 0838   HGB 13.8 05/09/2023 0838   HCT 42.1 05/09/2023 0838   PLT 308 05/09/2023 0838   MCV 94.4 05/09/2023 0838   MCH 30.9 05/09/2023 0838   MCHC 32.8 05/09/2023 0838    RDW 12.7 05/09/2023 0838   LYMPHSABS 1.9 05/09/2023 0838   MONOABS 0.4 05/09/2023 0838   EOSABS 0.1 05/09/2023 0838   BASOSABS 0.0 05/09/2023 0838      Latest Ref Rng & Units 05/09/2023    8:38 AM 11/07/2022    8:41 AM 09/08/2022    8:55 AM  BMP  Glucose 70 - 99 mg/dL 95  97  91   BUN 8 - 23 mg/dL 11  12  15    Creatinine 0.44 - 1.00 mg/dL 1.61  0.96  0.45   Sodium 135 - 145 mmol/L 143  142  141   Potassium 3.5 - 5.1 mmol/L 4.3  4.0  4.2   Chloride 98 - 111 mmol/L 108  109  104   CO2 22 - 32 mmol/L 27  25  28    Calcium 8.9 - 10.3 mg/dL 9.9  40.9  9.8    Chest imaging: CT Chest 04/19/21 1. The density of concern on chest radiography corresponds to some sclerotic spurring of the left anterior first rib costochondral junction. There is no true underlying pulmonary nodule in this vicinity. 2. Hazy interstitial accentuation anteriorly in the right lung is likely primarily related to prior radiation therapy given the other findings of prior breast surgery and right axillary dissection. 3. Minimal airway plugging in both lower lobes. 4.  Aortic Atherosclerosis  PFT:    Latest Ref Rng & Units 01/16/2022    8:29 AM  PFT Results  FVC-Pre L 2.60   FVC-Predicted Pre % 80   FVC-Post L 2.57   FVC-Predicted Post % 79   Pre FEV1/FVC % % 82   Post FEV1/FCV % % 84   FEV1-Pre L 2.12   FEV1-Predicted Pre % 86   FEV1-Post L 2.15   DLCO uncorrected ml/min/mmHg 21.53   DLCO UNC% % 105   DLCO corrected ml/min/mmHg 21.53   DLCO COR %Predicted % 105   DLVA Predicted % 123   TLC L 4.36   TLC % Predicted % 83   RV % Predicted % 68     Labs:  Path:  Echo:  Heart Catheterization:  Assessment & Plan:   Moderate persistent asthma without complication - Plan: fluticasone-salmeterol (WIXELA INHUB) 250-50 MCG/ACT AEPB  Seasonal allergic rhinitis due to other allergic trigger  Discussion: Katherine Hanna is a 67 year old woman, never smoker with history of breast cancer, and allergic  rhinitis who returns to pulmonary clinic for asthma.  Asthma Well controlled with Wixela 500 and Flonase. No recent exacerbations or nocturnal symptoms. Patient currently using Wixela once daily at night. -Reduce Wixela to 250 microgram dose twice daily. -Continue Flonase once daily at night. - Discontinue singulair  Allergic Rhinitis Well controlled with daily Zyrtec and Flonase. No postnasal drip. -Continue Zyrtec daily. -Continue Flonase once daily at night.  Follow-up in 1 year or sooner if symptoms worsen.  Melody Comas, MD [facility] Office: [phone]    Current Outpatient Medications:    anastrozole (ARIMIDEX) 1 MG tablet, TAKE 1 TABLET BY MOUTH EVERY DAY, Disp: 90 tablet, Rfl: 3   benzonatate (TESSALON) 200 MG capsule, Take 1 capsule (200 mg total) by mouth 3 (three) times daily as needed for cough., Disp: 30 capsule, Rfl: 1  Calcium Carbonate-Vitamin D (CALCIUM 500 + D PO), Take 1 tablet by mouth 2 (two) times daily., Disp: , Rfl:    cetirizine (ZYRTEC) 10 MG tablet, Take 10 mg by mouth at bedtime., Disp: , Rfl:    co-enzyme Q-10 30 MG capsule, Take 30 mg by mouth daily., Disp: , Rfl:    fluticasone (FLONASE) 50 MCG/ACT nasal spray, SPRAY 2 SPRAYS INTO EACH NOSTRIL EVERY DAY, Disp: 48 mL, Rfl: 1   fluticasone-salmeterol (WIXELA INHUB) 250-50 MCG/ACT AEPB, Inhale 1 puff into the lungs in the morning and at bedtime., Disp: 60 each, Rfl: 11   HYDROcodone bit-homatropine (HYCODAN) 5-1.5 MG/5ML syrup, Take 5 mLs by mouth every 6 (six) hours as needed for cough., Disp: 240 mL, Rfl: 0   Misc Natural Products (TART CHERRY ADVANCED PO), Take by mouth., Disp: , Rfl:    Multiple Vitamin (MULTIVITAMIN) tablet, Take 1 tablet by mouth daily., Disp: , Rfl:    TURMERIC PO, Take by mouth., Disp: , Rfl:    albuterol (VENTOLIN HFA) 108 (90 Base) MCG/ACT inhaler, Inhale 2 puffs into the lungs every 6 (six) hours as needed for wheezing or shortness of breath.  (Patient not taking: Reported on 05/10/2023), Disp: 8 g, Rfl: 0

## 2023-05-14 ENCOUNTER — Encounter: Payer: Self-pay | Admitting: Pulmonary Disease

## 2023-05-14 ENCOUNTER — Encounter: Payer: Self-pay | Admitting: Nurse Practitioner

## 2023-06-26 ENCOUNTER — Ambulatory Visit: Payer: Medicare HMO | Admitting: Pulmonary Disease

## 2023-07-04 ENCOUNTER — Ambulatory Visit: Payer: Self-pay | Admitting: Family Medicine

## 2023-07-04 ENCOUNTER — Ambulatory Visit (HOSPITAL_BASED_OUTPATIENT_CLINIC_OR_DEPARTMENT_OTHER): Payer: HMO | Admitting: Pulmonary Disease

## 2023-07-04 ENCOUNTER — Other Ambulatory Visit: Payer: Self-pay | Admitting: Hematology

## 2023-07-04 ENCOUNTER — Encounter (HOSPITAL_BASED_OUTPATIENT_CLINIC_OR_DEPARTMENT_OTHER): Payer: Self-pay | Admitting: Pulmonary Disease

## 2023-07-04 ENCOUNTER — Encounter: Payer: Self-pay | Admitting: Nurse Practitioner

## 2023-07-04 VITALS — BP 118/58 | HR 101 | Resp 18 | Ht 65.0 in | Wt 176.6 lb

## 2023-07-04 DIAGNOSIS — J4541 Moderate persistent asthma with (acute) exacerbation: Secondary | ICD-10-CM

## 2023-07-04 MED ORDER — PROMETHAZINE-DM 6.25-15 MG/5ML PO SYRP: 5.0000 mL | ORAL_SOLUTION | Freq: Three times a day (TID) | ORAL | 1 refills | Status: AC | PRN

## 2023-07-04 MED ORDER — AZITHROMYCIN 250 MG PO TABS
250.0000 mg | ORAL_TABLET | Freq: Every day | ORAL | 0 refills | Status: DC
Start: 2023-07-04 — End: 2023-09-10

## 2023-07-04 MED ORDER — PREDNISONE 10 MG PO TABS: ORAL_TABLET | ORAL | 0 refills | Status: DC

## 2023-07-04 NOTE — Patient Instructions (Addendum)
X Zpak Prednisone 10 mg tabs  Take 2 tabs daily with food x 5ds, then 1 tab daily with food x 5ds then STOP  Cough syrup   YOUR PLAN:  -PERSISTENT COUGH: Your persistent cough is likely due to viral bronchitis, which is an inflammation of the bronchial tubes usually caused by a virus. We will prescribe azithromycin and low-dose prednisone to help reduce inflammation, and a non-codeine cough syrup to avoid sedation. Please take the cough syrup at night initially to see if it makes you sleepy.  -ASTHMA: Your asthma is currently well-controlled, meaning you are not experiencing significant wheezing or other symptoms. Please continue to monitor your asthma symptoms and report any worsening.    INSTRUCTIONS:  We will send your prescriptions to [facility] in [place]. Please follow up with Dr. Alcide Goodness if your symptoms persist or worsen.

## 2023-07-04 NOTE — Telephone Encounter (Signed)
"Chief Complaint (e.g., cough, sob, wheezing, fever, chills, sweat or additional symptoms) *Go to specific symptom protocol after initial questions. Nasal drainage and intermittent cough "How long have symptoms been present?" 2 weeks MEDICINES:   "Have you used any OTC meds to help with symptoms?" Yes If yes, ask "What medications?" Mucinex DM, Flonase "Have you used your inhalers/maintenance medication?" Yes If yes, "What medications?" albuterol  "              The patient reported an intermittent cough and ongoing nasal drainage for the past 2 weeks.  The nasal discharge is yellow.  In 2022 she had similar symptoms for 3 months before recovering and in 2023 she experienced these symptoms from September to January of 2024.  She was instructed to follow up with pulmonary if the symptoms came back.  She has been taking Mucinex DM to help clear her sinuses which has been helpful.  She used her Flonase but experienced nose bleeds on the right intermittently so she stopped using Flonase. She has used her rescue inhaler for intermittent wheezing with exertion to help alleviate this symptom.  She stated that she is only wheezing a little and she has to sit totally still to notice.  She has noticed some increased shortness of breath when talking she has to stop.  This was not noted during triage call.  She requested to be seen at the end of this week or early next week for follow up.  Inquired if she would be willing to see her pcp for follow up and she stated that she would prefer pulmonology due to her past experience with these symptoms.  She was transferred to [facility] with scheduling for further assistance.    Reason for Disposition  Wheezing is present  Answer Assessment - Initial Assessment Questions 1. ONSET: "When did the cough begin?"      2 weeks  2. SEVERITY: "How bad is the cough today?"      Comes and goes throughout the day  3. SPUTUM: "Describe the color of your sputum" (none, dry  cough; clear, white, yellow, green)     Yellow; sometimes dry  4. HEMOPTYSIS: "Are you coughing up any blood?" If so ask: "How much?" (flecks, streaks, tablespoons, etc.)     No  5. DIFFICULTY BREATHING: "Are you having difficulty breathing?" If Yes, ask: "How bad is it?" (e.g., mild, moderate, severe)    - MILD: No SOB at rest, mild SOB with walking, speaks normally in sentences, can lie down, no retractions, pulse < 100.    - MODERATE: SOB at rest, SOB with minimal exertion and prefers to sit, cannot lie down flat, speaks in phrases, mild retractions, audible wheezing, pulse 100-120.    - SEVERE: Very SOB at rest, speaks in single words, struggling to breathe, sitting hunched forward, retractions, pulse > 120      Breathing worsening; has to stop talking for a moment to  6. FEVER: "Do you have a fever?" If Yes, ask: "What is your temperature, how was it measured, and when did it start?"     Low grade in the beginning but since then 98.8-99.9; normally 97.6 7. CARDIAC HISTORY: "Do you have any history of heart disease?" (e.g., heart attack, congestive heart failure)      None  8. LUNG HISTORY: "Do you have any history of lung disease?"  (e.g., pulmonary embolus, asthma, emphysema)     Asthma; scarring on right lung due to radiation therapy  10. OTHER  SYMPTOMS: "Do you have any other symptoms?" (e.g., runny nose, wheezing, chest pain)       Runny nose, a little bit of wheezing, chest feels sore from coughing  Protocols used: Cough - Acute Productive-A-AH

## 2023-07-04 NOTE — Progress Notes (Signed)
   Subjective:    Patient ID: Katherine Hanna, female    DOB: August 10, 1955, 68 y.o.   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 Started with a cold. She has been able to blow stuff out and was able to get stuff up not its a dry cough. Has a sore throat, some times ears pop. Hard to catch breath after cough spells. Has chest pain up high after fits.     The patient, with a history of asthma, presents with a persistent cough for two weeks. The cough is associated with chest soreness and tightness, and occasional wheezing. The patient reports that for the past two winters, she has developed a cough that has persisted for months. The current episode started after exposure to her granddaughters who had colds. The patient has been taking Mucinex twice daily, initially producing a significant amount of yellow sputum, but this has decreased over the past four days. The patient has previously used Hicodone cough syrup, mainly at night, but reports that cough syrups have not had a significant effect on her cough. The patient's asthma is not currently acting up.    Significant tests/ events reviewed  PFTs 12/2021  show normal lung function   CT scan 03/2021 anterior peripheral fibrotic changes of the right upper lobe secondary to breast radiation.   Review of Systems neg for any significant sore throat, dysphagia, itching, sneezing, nasal congestion or excess/ purulent secretions, fever, chills, sweats, unintended wt loss, pleuritic or exertional cp, hempoptysis, orthopnea pnd or change in chronic leg swelling. Also denies presyncope, palpitations, heartburn, abdominal pain, nausea, vomiting, diarrhea or change in bowel or urinary habits, dysuria,hematuria, rash, arthralgias, visual complaints, headache, numbness weakness  or ataxia.     Objective:   Physical Exam  Gen. Pleasant, well-nourished, in no distress ENT - no thrush, no pallor/icterus,no post nasal drip Neck: No JVD, no thyromegaly, no carotid bruits Lungs: no use of accessory muscles, no dullness to percussion, clear without rales or rhonchi  Cardiovascular: Rhythm regular, heart sounds  normal, no murmurs or gallops, no peripheral edema Musculoskeletal: No deformities, no cyanosis or clubbing        Assessment & Plan:   Persistent Cough/ Acute viral bronchitis Persistent cough for two weeks with rib soreness and occasional wheezing, likely due to viral bronchitis. No progression to lower respiratory infection; lungs clear on examination. Mucinex provides some relief. Discussed prescribing azithromycin and low-dose prednisone to reduce inflammation, and a non-codeine cough syrup to avoid sedation. Advised taking the cough syrup at night initially to assess for sedative effects. No x-ray needed as lungs are clear. - Prescribe azithromycin - Prescribe low-dose prednisone - Prescribe non-codeine cough syrup - Advise taking cough syrup at night initially to assess for sedative effects  Asthma Asthma is well-controlled with no significant wheezing or symptoms during this episode. -continue advair - Monitor asthma symptoms and report any exacerbations  General Health Maintenance Received RSV shot and COVID booster, important given her respiratory history. - Continue with current vaccination schedule  Follow-up - [facility] [place] - Follow up with Dr. Alcide Goodness if symptoms persist or worsen.

## 2023-07-05 NOTE — Telephone Encounter (Signed)
Pt seen Dr. Vassie Loll for acute on 2/12. Will close encounter

## 2023-07-09 ENCOUNTER — Telehealth: Payer: Self-pay | Admitting: Family Medicine

## 2023-07-09 NOTE — Telephone Encounter (Signed)
Copied from CRM [id number]. Topic: Medicare AWV >> Jul 09, 2023  3:02 PM Payton Doughty wrote: Reason for CRM: Called LVM 07/09/2023 to schedule AWV. Please schedule Virtual or Telehealth visits ONLY.   Verlee Rossetti; Care Guide Ambulatory Clinical Support [place] [facility] Direct Dial: [phone]

## 2023-07-09 NOTE — Telephone Encounter (Signed)
Copied from CRM [id number]. Topic: Medicare AWV >> Jul 09, 2023  3:01 PM Payton Doughty wrote: Reason for CRM: Katherine Hanna; Care Guide Ambulatory Clinical Support [place] [facility] Direct Dial: [phone]

## 2023-07-30 ENCOUNTER — Ambulatory Visit: Payer: HMO | Attending: General Surgery

## 2023-07-30 VITALS — Wt 178.4 lb

## 2023-07-30 DIAGNOSIS — Z483 Aftercare following surgery for neoplasm: Secondary | ICD-10-CM | POA: Insufficient documentation

## 2023-07-30 NOTE — Therapy (Signed)
 OUTPATIENT PHYSICAL THERAPY SOZO SCREENING NOTE   Patient Name: Katherine Hanna MRN: [id number] DOB:April 13, 1956, 68 y.o., female Today's Date: 07/30/2023  PCP: Donato Schultz, DO REFERRING PROVIDER: Almond Lint, MD   PT End of Session - 07/30/23 0813     Visit Number 2   # unchanged due to screen only   PT Start Time 0811    PT Stop Time 0815    PT Time Calculation (min) 4 min    Activity Tolerance Patient tolerated treatment well    Behavior During Therapy WFL for tasks assessed/performed             Past Medical History:  Diagnosis Date   Allergic rhinitis    Arthritis    knee   Asthma    Breast cancer (HCC)    History of chicken pox    Migraine    Rheumatic fever    as a child.   Past Surgical History:  Procedure Laterality Date   APPENDECTOMY  11/1971   BREAST LUMPECTOMY WITH RADIOACTIVE SEED AND SENTINEL LYMPH NODE BIOPSY Right 01/20/2020   Procedure: RIGHT BREAST LUMPECTOMY WITH RADIOACTIVE SEED AND SENTINEL LYMPH NODE BIOPSY;  Surgeon: Almond Lint, MD;  Location: [facility];  Service: General;  Laterality: Right;   COLONOSCOPY     TONSILLECTOMY     when she was 2 or 3   Patient Active Problem List   Diagnosis Date Noted   Moderate persistent asthmatic bronchitis with exacerbation 04/11/2022   URI (upper respiratory infection) 04/04/2022   Bronchitis 04/18/2021   Malignant neoplasm of upper-inner quadrant of right breast in female, estrogen receptor positive (HCC) 12/08/2019   Seasonal allergies 07/30/2017   Need for diphtheria-tetanus-pertussis (Tdap) vaccine 07/30/2017   Vitamin D deficiency 06/28/2016   Osteoporosis 06/18/2015   Asthma with bronchitis 06/26/2014   Food allergy 02/28/2012   Depression 10/11/2010   Preventative health care 10/05/2010   Screening for malignant neoplasm of cervix 10/05/2010   COMMON MIGRAINE 08/31/2009   Allergic rhinitis 08/31/2009   CERVICAL POLYP 08/31/2009   ARTHRITIS 08/31/2009    POSTMENOPAUSAL STATUS 08/31/2009    REFERRING DIAG: right breast cancer at risk for lymphedema  THERAPY DIAG: Aftercare following surgery for neoplasm  PERTINENT HISTORY: Patient was diagnosed on 11/10/2019 with right grade II invasive ductal carcinoma breast cancer. Patient underwent a right lumpectomy and sentinel node biopsy on 01/20/2020 with 1/4 positive axillary lymph nodes. It is ER/PR positive and HER2 negative with a Ki67 of 10%.   PRECAUTIONS: right UE Lymphedema risk, None  SUBJECTIVE: Pt returns for her 6 month L-Dex   PAIN:  Are you having pain? No  SOZO SCREENING: Patient was assessed today using the SOZO machine to determine the lymphedema index score. This was compared to her baseline score. It was determined that she is within the recommended range when compared to her baseline and no further action is needed at this time. She will continue SOZO screenings. These are done every 3 months for 2 years post operatively followed by every 6 months for 2 years, and then annually.   L-DEX FLOWSHEETS - 07/30/23 0800       L-DEX LYMPHEDEMA SCREENING   Measurement Type Unilateral    L-DEX MEASUREMENT EXTREMITY Upper Extremity    POSITION  Standing    DOMINANT SIDE Right    At Risk Side Right    BASELINE SCORE (UNILATERAL) 4.3    L-DEX SCORE (UNILATERAL) 7.9    VALUE CHANGE (UNILAT) 3.6  Hermenia Bers, PTA 07/30/2023, 8:14 AM

## 2023-08-14 DIAGNOSIS — H3562 Retinal hemorrhage, left eye: Secondary | ICD-10-CM | POA: Diagnosis not present

## 2023-08-14 DIAGNOSIS — D3131 Benign neoplasm of right choroid: Secondary | ICD-10-CM | POA: Diagnosis not present

## 2023-08-14 DIAGNOSIS — H5213 Myopia, bilateral: Secondary | ICD-10-CM | POA: Diagnosis not present

## 2023-08-14 DIAGNOSIS — H43312 Vitreous membranes and strands, left eye: Secondary | ICD-10-CM | POA: Diagnosis not present

## 2023-08-21 DIAGNOSIS — H33312 Horseshoe tear of retina without detachment, left eye: Secondary | ICD-10-CM | POA: Diagnosis not present

## 2023-08-21 DIAGNOSIS — H2513 Age-related nuclear cataract, bilateral: Secondary | ICD-10-CM | POA: Diagnosis not present

## 2023-08-21 DIAGNOSIS — H43813 Vitreous degeneration, bilateral: Secondary | ICD-10-CM | POA: Diagnosis not present

## 2023-08-21 DIAGNOSIS — D3131 Benign neoplasm of right choroid: Secondary | ICD-10-CM | POA: Diagnosis not present

## 2023-09-10 ENCOUNTER — Encounter: Payer: Self-pay | Admitting: Family Medicine

## 2023-09-10 ENCOUNTER — Ambulatory Visit (INDEPENDENT_AMBULATORY_CARE_PROVIDER_SITE_OTHER): Payer: Medicare HMO | Admitting: Family Medicine

## 2023-09-10 VITALS — BP 118/80 | HR 74 | Temp 98.5°F | Resp 18 | Ht 65.0 in | Wt 177.4 lb

## 2023-09-10 DIAGNOSIS — Z1211 Encounter for screening for malignant neoplasm of colon: Secondary | ICD-10-CM

## 2023-09-10 DIAGNOSIS — F419 Anxiety disorder, unspecified: Secondary | ICD-10-CM

## 2023-09-10 DIAGNOSIS — Z136 Encounter for screening for cardiovascular disorders: Secondary | ICD-10-CM | POA: Diagnosis not present

## 2023-09-10 DIAGNOSIS — Z0000 Encounter for general adult medical examination without abnormal findings: Secondary | ICD-10-CM | POA: Diagnosis not present

## 2023-09-10 LAB — CBC WITH DIFFERENTIAL/PLATELET
Basophils Absolute: 0 10*3/uL (ref 0.0–0.1)
Basophils Relative: 0.5 % (ref 0.0–3.0)
Eosinophils Absolute: 0.1 10*3/uL (ref 0.0–0.7)
Eosinophils Relative: 1.9 % (ref 0.0–5.0)
HCT: 41.1 % (ref 36.0–46.0)
Hemoglobin: 13.8 g/dL (ref 12.0–15.0)
Lymphocytes Relative: 24.2 % (ref 12.0–46.0)
Lymphs Abs: 1.4 10*3/uL (ref 0.7–4.0)
MCHC: 33.6 g/dL (ref 30.0–36.0)
MCV: 94.2 fl (ref 78.0–100.0)
Monocytes Absolute: 0.4 10*3/uL (ref 0.1–1.0)
Monocytes Relative: 7.1 % (ref 3.0–12.0)
Neutro Abs: 3.8 10*3/uL (ref 1.4–7.7)
Neutrophils Relative %: 66.3 % (ref 43.0–77.0)
Platelets: 292 10*3/uL (ref 150.0–400.0)
RBC: 4.36 Mil/uL (ref 3.87–5.11)
RDW: 13.9 % (ref 11.5–15.5)
WBC: 5.8 10*3/uL (ref 4.0–10.5)

## 2023-09-10 LAB — COMPREHENSIVE METABOLIC PANEL WITH GFR
ALT: 15 U/L (ref 0–35)
AST: 19 U/L (ref 0–37)
Albumin: 4.5 g/dL (ref 3.5–5.2)
Alkaline Phosphatase: 92 U/L (ref 39–117)
BUN: 13 mg/dL (ref 6–23)
CO2: 28 meq/L (ref 19–32)
Calcium: 9.7 mg/dL (ref 8.4–10.5)
Chloride: 105 meq/L (ref 96–112)
Creatinine, Ser: 0.83 mg/dL (ref 0.40–1.20)
GFR: 71.86 mL/min (ref >60.00)
Glucose, Bld: 98 mg/dL (ref 70–99)
Potassium: 4.1 meq/L (ref 3.5–5.1)
Sodium: 141 meq/L (ref 135–145)
Total Bilirubin: 0.6 mg/dL (ref 0.2–1.2)
Total Protein: 7 g/dL (ref 6.0–8.3)

## 2023-09-10 LAB — LIPID PANEL
Cholesterol: 210 mg/dL — ABNORMAL HIGH (ref 0–200)
HDL: 58.9 mg/dL (ref >39.00)
LDL Cholesterol: 128 mg/dL — ABNORMAL HIGH (ref 0–99)
NonHDL: 150.8
Total CHOL/HDL Ratio: 4
Triglycerides: 113 mg/dL (ref 0.0–149.0)
VLDL: 22.6 mg/dL (ref 0.0–40.0)

## 2023-09-10 LAB — TSH: TSH: 2.13 u[IU]/mL (ref 0.35–5.50)

## 2023-09-10 MED ORDER — SERTRALINE HCL 25 MG PO TABS: 25.0000 mg | ORAL_TABLET | Freq: Every day | ORAL | 3 refills | Status: DC

## 2023-09-10 NOTE — Progress Notes (Signed)
 Established Patient Office Visit  Subjective   Patient ID: Katherine Hanna, female    DOB: July 03, 1955  Age: 68 y.o. MRN: [id number]  Chief Complaint  Patient presents with   Annual Exam    HPI Discussed the use of AI scribe software for clinical note transcription with the patient, who gave verbal consent to proceed.  History of Present Illness Katherine Hanna is a 68 year old female who presents for an annual physical exam.  She has been experiencing anxiety attacks for the past nine months, primarily triggered by new situations or environments. These episodes occur weekly and have been exacerbated since her husband's fall in December. She manages them with deep breathing but finds the attacks unpleasant. She has no prior history of taking medication for anxiety.  She has a history of a torn retina, which was repaired approximately three weeks ago. A follow-up appointment is scheduled for March 30th to assess the repair.  Her husband suffered a hip fracture in December, requiring a partial replacement. He completed physical therapy in [place]  but has not been approved for further therapy since returning home. He continues to perform exercises twice daily and is showing improvement, though he still struggles with some activities.  She maintains physical activity through chair exercises and line dancing. She has regular follow-ups with her oncologist, pulmonologist, eye doctor, and dentist. She is current with her mammogram and bone density tests and has an upcoming appointment with her dermatologist.  She has a history of tendonitis and arthritis in her foot, which causes pain, and also has a bunion on the same foot. No issues with her stomach or other joints aside from the known foot problems.   Patient Active Problem List   Diagnosis Date Noted   Moderate persistent asthmatic bronchitis with exacerbation 04/11/2022   URI (upper respiratory infection) 04/04/2022    Bronchitis 04/18/2021   Malignant neoplasm of upper-inner quadrant of right breast in female, estrogen receptor positive (HCC) 12/08/2019   Seasonal allergies 07/30/2017   Need for diphtheria-tetanus-pertussis (Tdap) vaccine 07/30/2017   Vitamin D  deficiency 06/28/2016   Osteoporosis 06/18/2015   Asthma with bronchitis 06/26/2014   Food allergy 02/28/2012   Depression 10/11/2010   Preventative health care 10/05/2010   Screening for malignant neoplasm of cervix 10/05/2010   COMMON MIGRAINE 08/31/2009   Allergic rhinitis 08/31/2009   CERVICAL POLYP 08/31/2009   ARTHRITIS 08/31/2009   POSTMENOPAUSAL STATUS 08/31/2009   Past Medical History:  Diagnosis Date   Allergic rhinitis    Arthritis    knee   Asthma    Breast cancer (HCC)    History of chicken pox    Migraine    Rheumatic fever    as a child.   Past Surgical History:  Procedure Laterality Date   APPENDECTOMY  11/1971   BREAST LUMPECTOMY WITH RADIOACTIVE SEED AND SENTINEL LYMPH NODE BIOPSY Right 01/20/2020   Procedure: RIGHT BREAST LUMPECTOMY WITH RADIOACTIVE SEED AND SENTINEL LYMPH NODE BIOPSY;  Surgeon: Lockie Rima, MD;  Location: [facility];  Service: General;  Laterality: Right;   COLONOSCOPY     RETINAL TEAR REPAIR CRYOTHERAPY Left    TONSILLECTOMY     when she was 2 or 3   Social History   Tobacco Use   Smoking status: Never   Smokeless tobacco: Never  Vaping Use   Vaping status: Never Used  Substance Use Topics   Alcohol use: No   Drug use: No   Social History   Socioeconomic History   Marital status: Married    Spouse name: Not on file   Number of children: 2   Years of education: Not on file   Highest education level: Not on file  Occupational History   Occupation: IT trainer  Tobacco Use   Smoking status: Never   Smokeless tobacco: Never  Vaping Use   Vaping status: Never Used  Substance and Sexual Activity   Alcohol use: No   Drug use: No   Sexual activity: Yes     Partners: Male  Other Topics Concern   Not on file  Social History Narrative   Exercise---   chair exercise, line dancing class    Social Drivers of Health   Financial Resource Strain: Low Risk  (08/11/2021)   Overall Financial Resource Strain (CARDIA)    Difficulty of Paying Living Expenses: Not hard at all  Food Insecurity: No Food Insecurity (08/15/2022)   Hunger Vital Sign    Worried About Running Out of Food in the Last Year: Never true    Ran Out of Food in the Last Year: Never true  Transportation Needs: No Transportation Needs (08/15/2022)   PRAPARE - Administrator, Civil Service (Medical): No    Lack of Transportation (Non-Medical): No  Physical Activity: Sufficiently Active (08/15/2022)   Exercise Vital Sign    Days of Exercise per Week: 3 days    Minutes of Exercise per Session: 90 min  Stress: No Stress Concern Present (08/11/2021)   [employer] of Occupational Health - Occupational Stress Questionnaire    Feeling of Stress : Not at all  Social Connections: Moderately Integrated (08/11/2021)   Social Connection and Isolation Panel [NHANES]    Frequency of Communication with Friends and Family: More than three times a week    Frequency of Social Gatherings with Friends and Family: Once a week    Attends Religious Services: More than 4 times per year    Active Member of [employer] or Organizations: No    Attends Banker Meetings: Never    Marital Status: Married  Catering manager Violence: Not At Risk (08/15/2022)   Humiliation, Afraid, Rape, and Kick questionnaire    Fear of Current or Ex-Partner: No    Emotionally Abused: No    Physically Abused: No    Sexually Abused: No   Family Status  Relation Name Status   Mother  Deceased at age 39   Father  Deceased at age 45   Brother  Deceased at age 70   MGM  (Not Specified)   MGF  (Not Specified)   PGM  (Not Specified)   Mat Uncle Aron Lard Alive   [employer]  (Not Specified)   Neg Hx  (Not  Specified)  No partnership data on file   Family History  Problem Relation Age of Onset   Lung cancer Mother        smoked   Diabetes Father    Stroke Father    Cancer Father        bladder   Leukemia Father    Stroke Brother    Stroke Maternal Grandmother        40s   Coronary artery disease Maternal Grandfather    Stroke Paternal Grandmother    Colon cancer Maternal Uncle    Breast cancer Paternal Aunt    Esophageal cancer Neg Hx  Allergies  Allergen Reactions   Penicillins Rash    Rash head to toe      Review of Systems  Constitutional:  Negative for fever and malaise/fatigue.  HENT:  Negative for congestion.   Eyes:  Negative for blurred vision.  Respiratory:  Negative for shortness of breath.   Cardiovascular:  Negative for chest pain, palpitations and leg swelling.  Gastrointestinal:  Negative for abdominal pain, blood in stool and nausea.  Genitourinary:  Negative for dysuria and frequency.  Musculoskeletal:  Negative for falls.  Skin:  Negative for rash.  Neurological:  Negative for dizziness, loss of consciousness and headaches.  Endo/Heme/Allergies:  Negative for environmental allergies.  Psychiatric/Behavioral:  Negative for depression. The patient is not nervous/anxious.       Objective:     BP 118/80 (BP Location: Left Arm, Patient Position: Sitting, Cuff Size: Normal)   Pulse 74   Temp 98.5 F (36.9 C) (Oral)   Resp 18   Ht 5\' 5"  (1.651 m)   Wt 177 lb 6.4 oz (80.5 kg)   SpO2 99%   BMI 29.52 kg/m  BP Readings from Last 3 Encounters:  09/10/23 118/80  07/04/23 (!) 118/58  05/10/23 128/62   Wt Readings from Last 3 Encounters:  09/10/23 177 lb 6.4 oz (80.5 kg)  07/30/23 178 lb 6 oz (80.9 kg)  07/04/23 176 lb 9.6 oz (80.1 kg)   SpO2 Readings from Last 3 Encounters:  09/10/23 99%  07/04/23 98%  05/10/23 99%      Physical Exam Vitals and nursing note reviewed.  Constitutional:      General: She is not in acute distress.     Appearance: Normal appearance. She is well-developed.  HENT:     Head: Normocephalic and atraumatic.     Right Ear: Tympanic membrane, ear canal and external ear normal. There is no impacted cerumen.     Left Ear: Tympanic membrane, ear canal and external ear normal. There is no impacted cerumen.     Nose: Nose normal.     Mouth/Throat:     Mouth: Mucous membranes are moist.     Pharynx: Oropharynx is clear. No oropharyngeal exudate or posterior oropharyngeal erythema.  Eyes:     General: No scleral icterus.       Right eye: No discharge.        Left eye: No discharge.     Conjunctiva/sclera: Conjunctivae normal.     Pupils: Pupils are equal, round, and reactive to light.  Neck:     Thyroid : No thyromegaly or thyroid  tenderness.     Vascular: No JVD.  Cardiovascular:     Rate and Rhythm: Normal rate and regular rhythm.     Heart sounds: Normal heart sounds. No murmur heard. Pulmonary:     Effort: Pulmonary effort is normal. No respiratory distress.     Breath sounds: Normal breath sounds.  Abdominal:     General: Bowel sounds are normal. There is no distension.     Palpations: Abdomen is soft. There is no mass.     Tenderness: There is no abdominal tenderness. There is no guarding or rebound.  Genitourinary:    Vagina: Normal.  Musculoskeletal:        General: Normal range of motion.     Cervical back: Normal range of motion and neck supple.     Right lower leg: No edema.     Left lower leg: No edema.  Lymphadenopathy:     Cervical: No cervical adenopathy.  Skin:    General: Skin is warm and dry.     Findings: No erythema or rash.  Neurological:     Mental Status: She is alert and oriented to person, place, and time.     Cranial Nerves: No cranial nerve deficit.     Deep Tendon Reflexes: Reflexes are normal and symmetric.  Psychiatric:        Mood and Affect: Mood normal.        Behavior: Behavior normal.        Thought Content: Thought content normal.        Judgment:  Judgment normal.      No results found for any visits on 09/10/23.  Last CBC Lab Results  Component Value Date   WBC 5.8 05/09/2023   HGB 13.8 05/09/2023   HCT 42.1 05/09/2023   MCV 94.4 05/09/2023   MCH 30.9 05/09/2023   RDW 12.7 05/09/2023   PLT 308 05/09/2023   Last metabolic panel Lab Results  Component Value Date   GLUCOSE 95 05/09/2023   NA 143 05/09/2023   K 4.3 05/09/2023   CL 108 05/09/2023   CO2 27 05/09/2023   BUN 11 05/09/2023   CREATININE 0.86 05/09/2023   GFRNONAA >60 05/09/2023   CALCIUM 9.9 05/09/2023   PROT 7.0 05/09/2023   ALBUMIN 4.4 05/09/2023   BILITOT 0.5 05/09/2023   ALKPHOS 87 05/09/2023   AST 17 05/09/2023   ALT 14 05/09/2023   ANIONGAP 8 05/09/2023   Last lipids Lab Results  Component Value Date   CHOL 202 (H) 09/08/2022   HDL 53.80 09/08/2022   LDLCALC 127 (H) 09/08/2022   LDLDIRECT 129.0 08/04/2019   TRIG 103.0 09/08/2022   CHOLHDL 4 09/08/2022   Last hemoglobin A1c Lab Results  Component Value Date   HGBA1C 5.7 08/05/2020   Last thyroid  functions Lab Results  Component Value Date   TSH 1.35 08/04/2019   Last vitamin D  Lab Results  Component Value Date   VD25OH 64.31 08/05/2020   Last vitamin B12 and Folate No results found for: "VITAMINB12", "FOLATE"    The 10-year ASCVD risk score (Arnett DK, et al., 2019) is: 7.4%    Assessment & Plan:   Problem List Items Addressed This Visit       Unprioritized   Preventative health care - Primary   Ghm utd Check labs  See AVS Health Maintenance  Topic Date Due   Colonoscopy  10/12/2019   COVID-19 Vaccine (7 - 2024-25 season) 03/18/2023   Medicare Annual Wellness (AWV)  08/15/2023   MAMMOGRAM  12/19/2023   INFLUENZA VACCINE  12/21/2023   DEXA SCAN  12/18/2024   DTaP/Tdap/Td (3 - Td or Tdap) 07/31/2027   Pneumonia Vaccine 59+ Years old  Completed   Hepatitis C Screening  Completed   Zoster Vaccines- Shingrix   Completed   HPV VACCINES  Aged Out   Meningococcal  B Vaccine  Aged Out         Other Visit Diagnoses       Colon cancer screening       Relevant Orders   Ambulatory referral to Gastroenterology     Anxiety       Relevant Medications   sertraline  (ZOLOFT ) 25 MG tablet   Other Relevant Orders   Comprehensive metabolic panel with GFR   Lipid panel   TSH     Ischemic heart disease screen       Relevant Orders   CBC with Differential/Platelet   Comprehensive metabolic  panel with GFR   Lipid panel   TSH     Assessment and Plan Assessment & Plan Anxiety   She has been experiencing weekly anxiety attacks for approximately nine months, particularly in new situations. Her anxiety worsened after her husband's hip injury in December. She manages attacks with deep breathing but finds them unpleasant. Treatment options were discussed, including as-needed and daily medications. As-needed medication can cause drowsiness and is addictive, while daily medication is non-addictive but may take two weeks to two months to become effective. She prefers to start with daily medication. Start low-dose daily medication for anxiety. Follow up in one month to assess medication effectiveness, which can be virtual or in-person. Order blood work.  Tendonitis and Arthritis in Foot   She has tendonitis and arthritis in her foot, causing pain. She is aware of the condition and its implications.  General Health Maintenance   She is up to date with eye doctor visits, dental check-ups, and bone density screenings. She sees her oncologist biannually and pulmonologist annually. She is due for a mammogram and colonoscopy. She engages in regular exercise, including chair exercises and line dancing. Schedule mammogram and colonoscopy. Continue regular exercise regimen.    Return in about 4 weeks (around 10/08/2023) for anxiety.    Adien Kimmel R Lowne Chase, DO

## 2023-09-10 NOTE — Assessment & Plan Note (Signed)
 Ghm utd Check labs  See AVS Health Maintenance  Topic Date Due   Colonoscopy  10/12/2019   COVID-19 Vaccine (7 - 2024-25 season) 03/18/2023   Medicare Annual Wellness (AWV)  08/15/2023   MAMMOGRAM  12/19/2023   INFLUENZA VACCINE  12/21/2023   DEXA SCAN  12/18/2024   DTaP/Tdap/Td (3 - Td or Tdap) 07/31/2027   Pneumonia Vaccine 22+ Years old  Completed   Hepatitis C Screening  Completed   Zoster Vaccines- Shingrix   Completed   HPV VACCINES  Aged Out   Meningococcal B Vaccine  Aged Out

## 2023-09-10 NOTE — Patient Instructions (Signed)
 Preventive Care 43 Years and Older, Female Preventive care refers to lifestyle choices and visits with your health care provider that can promote health and wellness. Preventive care visits are also called wellness exams. What can I expect for my preventive care visit? Counseling Your health care provider may ask you questions about your: Medical history, including: Past medical problems. Family medical history. Pregnancy and menstrual history. History of falls. Current health, including: Memory and ability to understand (cognition). Emotional well-being. Home life and relationship well-being. Sexual activity and sexual health. Lifestyle, including: Alcohol, nicotine or tobacco, and drug use. Access to firearms. Diet, exercise, and sleep habits. Work and work Astronomer. Sunscreen use. Safety issues such as seatbelt and bike helmet use. Physical exam Your health care provider will check your: Height and weight. These may be used to calculate your BMI (body mass index). BMI is a measurement that tells if you are at a healthy weight. Waist circumference. This measures the distance around your waistline. This measurement also tells if you are at a healthy weight and may help predict your risk of certain diseases, such as type 2 diabetes and high blood pressure. Heart rate and blood pressure. Body temperature. Skin for abnormal spots. What immunizations do I need?  Vaccines are usually given at various ages, according to a schedule. Your health care provider will recommend vaccines for you based on your age, medical history, and lifestyle or other factors, such as travel or where you work. What tests do I need? Screening Your health care provider may recommend screening tests for certain conditions. This may include: Lipid and cholesterol levels. Hepatitis C test. Hepatitis B test. HIV (human immunodeficiency virus) test. STI (sexually transmitted infection) testing, if you are at  risk. Lung cancer screening. Colorectal cancer screening. Diabetes screening. This is done by checking your blood sugar (glucose) after you have not eaten for a while (fasting). Mammogram. Talk with your health care provider about how often you should have regular mammograms. BRCA-related cancer screening. This may be done if you have a family history of breast, ovarian, tubal, or peritoneal cancers. Bone density scan. This is done to screen for osteoporosis. Talk with your health care provider about your test results, treatment options, and if necessary, the need for more tests. Follow these instructions at home: Eating and drinking  Eat a diet that includes fresh fruits and vegetables, whole grains, lean protein, and low-fat dairy products. Limit your intake of foods with high amounts of sugar, saturated fats, and salt. Take vitamin and mineral supplements as recommended by your health care provider. Do not drink alcohol if your health care provider tells you not to drink. If you drink alcohol: Limit how much you have to 0-1 drink a day. Know how much alcohol is in your drink. In the [place] one drink equals one 12 oz bottle of beer (355 mL), one 5 oz glass of wine (148 mL), or one 1 oz glass of hard liquor (44 mL). Lifestyle Brush your teeth every morning and night with fluoride toothpaste. Floss one time each day. Exercise for at least 30 minutes 5 or more days each week. Do not use any products that contain nicotine or tobacco. These products include cigarettes, chewing tobacco, and vaping devices, such as e-cigarettes. If you need help quitting, ask your health care provider. Do not use drugs. If you are sexually active, practice safe sex. Use a condom or other form of protection in order to prevent STIs. Take aspirin only as told by  your health care provider. Make sure that you understand how much to take and what form to take. Work with your health care provider to find out whether it  is safe and beneficial for you to take aspirin daily. Ask your health care provider if you need to take a cholesterol-lowering medicine (statin). Find healthy ways to manage stress, such as: Meditation, yoga, or listening to music. Journaling. Talking to a trusted person. Spending time with friends and family. Minimize exposure to UV radiation to reduce your risk of skin cancer. Safety Always wear your seat belt while driving or riding in a vehicle. Do not drive: If you have been drinking alcohol. Do not ride with someone who has been drinking. When you are tired or distracted. While texting. If you have been using any mind-altering substances or drugs. Wear a helmet and other protective equipment during sports activities. If you have firearms in your house, make sure you follow all gun safety procedures. What's next? Visit your health care provider once a year for an annual wellness visit. Ask your health care provider how often you should have your eyes and teeth checked. Stay up to date on all vaccines. This information is not intended to replace advice given to you by your health care provider. Make sure you discuss any questions you have with your health care provider. Document Revised: 11/03/2020 Document Reviewed: 11/03/2020 Elsevier Patient Education  2024 [employer].

## 2023-09-12 ENCOUNTER — Other Ambulatory Visit: Payer: Self-pay | Admitting: Family Medicine

## 2023-09-12 ENCOUNTER — Encounter: Payer: Self-pay | Admitting: Family Medicine

## 2023-09-12 DIAGNOSIS — E785 Hyperlipidemia, unspecified: Secondary | ICD-10-CM

## 2023-09-18 DIAGNOSIS — D229 Melanocytic nevi, unspecified: Secondary | ICD-10-CM | POA: Diagnosis not present

## 2023-09-18 DIAGNOSIS — L814 Other melanin hyperpigmentation: Secondary | ICD-10-CM | POA: Diagnosis not present

## 2023-09-18 DIAGNOSIS — L821 Other seborrheic keratosis: Secondary | ICD-10-CM | POA: Diagnosis not present

## 2023-09-19 DIAGNOSIS — H2513 Age-related nuclear cataract, bilateral: Secondary | ICD-10-CM | POA: Diagnosis not present

## 2023-09-19 DIAGNOSIS — H31092 Other chorioretinal scars, left eye: Secondary | ICD-10-CM | POA: Diagnosis not present

## 2023-09-19 DIAGNOSIS — H43813 Vitreous degeneration, bilateral: Secondary | ICD-10-CM | POA: Diagnosis not present

## 2023-09-19 DIAGNOSIS — D3131 Benign neoplasm of right choroid: Secondary | ICD-10-CM | POA: Diagnosis not present

## 2023-10-01 ENCOUNTER — Encounter (HOSPITAL_COMMUNITY): Payer: Self-pay

## 2023-10-02 ENCOUNTER — Other Ambulatory Visit: Payer: Self-pay | Admitting: Family Medicine

## 2023-10-02 DIAGNOSIS — F419 Anxiety disorder, unspecified: Secondary | ICD-10-CM

## 2023-10-08 ENCOUNTER — Ambulatory Visit: Admitting: Family Medicine

## 2023-10-09 ENCOUNTER — Encounter: Payer: Self-pay | Admitting: Family Medicine

## 2023-10-09 ENCOUNTER — Ambulatory Visit (INDEPENDENT_AMBULATORY_CARE_PROVIDER_SITE_OTHER): Admitting: Family Medicine

## 2023-10-09 VITALS — BP 120/70 | HR 70 | Temp 98.0°F | Resp 18 | Ht 65.0 in | Wt 174.8 lb

## 2023-10-09 DIAGNOSIS — F419 Anxiety disorder, unspecified: Secondary | ICD-10-CM | POA: Diagnosis not present

## 2023-10-09 NOTE — Patient Instructions (Signed)

## 2023-10-09 NOTE — Progress Notes (Signed)
 Established Patient Office Visit  Subjective                       Patient ID: Katherine Hanna, female    DOB: 02-07-1956  Age: 68 y.o. MRN: [id number]  Chief Complaint  Patient presents with   Anxiety   Follow-up    HPI Discussed the use of AI scribe software for clinical note transcription with the patient, who gave verbal consent to proceed.  History of Present Illness Katherine Hanna is a 68 year old female who presents for a follow-up visit regarding her medication management.  She is generally satisfied with her current medication regimen, describing it as 'really good' overall. However, she experiences anxiety-related repetitive finger movements as a calming mechanism.  She notes a decrease in appetite, which she views positively. She experiences difficulty falling asleep but sleeps well once asleep. She uses melatonin to aid sleep and does not attribute her sleep issues solely to her medication.  She takes sertraline  in the morning and is content with the current dosage, having recently refilled her prescription for a 90-day supply.  She takes magnesium at night, approximately 250 mg, and also takes calcium. She believes magnesium helps with muscle cramps and restless legs.   Patient Active Problem List   Diagnosis Date Noted   Anxiety 10/09/2023   Moderate persistent asthmatic bronchitis with exacerbation 04/11/2022   URI (upper respiratory infection) 04/04/2022   Bronchitis 04/18/2021   Malignant neoplasm of upper-inner quadrant of right breast in female, estrogen receptor positive (HCC) 12/08/2019   Seasonal allergies 07/30/2017   Need for diphtheria-tetanus-pertussis (Tdap) vaccine 07/30/2017   Vitamin D  deficiency 06/28/2016   Osteoporosis 06/18/2015   Asthma with bronchitis 06/26/2014   Food allergy 02/28/2012   Depression 10/11/2010   Preventative health care 10/05/2010   Screening for malignant neoplasm of cervix 10/05/2010   COMMON MIGRAINE 08/31/2009    Allergic rhinitis 08/31/2009   CERVICAL POLYP 08/31/2009   ARTHRITIS 08/31/2009   POSTMENOPAUSAL STATUS 08/31/2009   Past Medical History:  Diagnosis Date   Allergic rhinitis    Arthritis    knee   Asthma    Breast cancer (HCC)    History of chicken pox    Migraine    Rheumatic fever    as a child.   Past Surgical History:  Procedure Laterality Date   APPENDECTOMY  11/1971   BREAST LUMPECTOMY WITH RADIOACTIVE SEED AND SENTINEL LYMPH NODE BIOPSY Right 01/20/2020   Procedure: RIGHT BREAST LUMPECTOMY WITH RADIOACTIVE SEED AND SENTINEL LYMPH NODE BIOPSY;  Surgeon: Lockie Rima, MD;  Location: [facility];  Service: General;  Laterality: Right;   COLONOSCOPY     RETINAL TEAR REPAIR CRYOTHERAPY Left    TONSILLECTOMY     when she was 2 or 3   Social History   Tobacco Use   Smoking status: Never   Smokeless tobacco: Never  Vaping Use   Vaping status: Never Used  Substance Use Topics   Alcohol use: No   Drug use: No   Social History   Socioeconomic History   Marital status: Married    Spouse name: Not on file   Number of children: 2   Years of education: Not on file   Highest education level: Not on file  Occupational History   Occupation: IT trainer  Tobacco Use   Smoking status: Never   Smokeless tobacco: Never  Vaping Use   Vaping status: Never Used  Substance and  Sexual Activity   Alcohol use: No   Drug use: No   Sexual activity: Yes    Partners: Male  Other Topics Concern   Not on file  Social History Narrative   Exercise---   chair exercise, line dancing class    Social Drivers of Health   Financial Resource Strain: Low Risk  (08/11/2021)   Overall Financial Resource Strain (CARDIA)    Difficulty of Paying Living Expenses: Not hard at all  Food Insecurity: No Food Insecurity (08/15/2022)   Hunger Vital Sign    Worried About Running Out of Food in the Last Year: Never true    Ran Out of Food in the Last Year: Never true  Transportation Needs:  No Transportation Needs (08/15/2022)   PRAPARE - Administrator, Civil Service (Medical): No    Lack of Transportation (Non-Medical): No  Physical Activity: Sufficiently Active (08/15/2022)   Exercise Vital Sign    Days of Exercise per Week: 3 days    Minutes of Exercise per Session: 90 min  Stress: No Stress Concern Present (08/11/2021)   [employer] of Occupational Health - Occupational Stress Questionnaire    Feeling of Stress : Not at all  Social Connections: Moderately Integrated (08/11/2021)   Social Connection and Isolation Panel [NHANES]    Frequency of Communication with Friends and Family: More than three times a week    Frequency of Social Gatherings with Friends and Family: Once a week    Attends Religious Services: More than 4 times per year    Active Member of [employer] or Organizations: No    Attends Banker Meetings: Never    Marital Status: Married  Catering manager Violence: Not At Risk (08/15/2022)   Humiliation, Afraid, Rape, and Kick questionnaire    Fear of Current or Ex-Partner: No    Emotionally Abused: No    Physically Abused: No    Sexually Abused: No   Family Status  Relation Name Status   Mother  Deceased at age 48   Father  Deceased at age 62   Brother  Deceased at age 73   MGM  (Not Specified)   MGF  (Not Specified)   PGM  (Not Specified)   Mat Uncle Aron Lard Alive   [employer]  (Not Specified)   Neg Hx  (Not Specified)  No partnership data on file   Family History  Problem Relation Age of Onset   Lung cancer Mother        smoked   Diabetes Father    Stroke Father    Cancer Father        bladder   Leukemia Father    Stroke Brother    Stroke Maternal Grandmother        40s   Coronary artery disease Maternal Grandfather    Stroke Paternal Grandmother    Colon cancer Maternal Uncle    Breast cancer Paternal Aunt    Esophageal cancer Neg Hx    Allergies  Allergen Reactions   Penicillins Rash    Rash head to toe       Review of Systems  Constitutional:  Negative for fever and malaise/fatigue.  HENT:  Negative for congestion.   Eyes:  Negative for blurred vision.  Respiratory:  Negative for cough and shortness of breath.   Cardiovascular:  Negative for chest pain, palpitations and leg swelling.  Gastrointestinal:  Negative for vomiting.  Musculoskeletal:  Negative for back pain.  Skin:  Negative for rash.  Neurological:  Negative for loss of consciousness and headaches.      Objective:     BP 120/70 (BP Location: Left Arm, Patient Position: Sitting, Cuff Size: Normal)   Pulse 70   Temp 98 F (36.7 C) (Oral)   Resp 18   Ht 5\' 5"  (1.651 m)   Wt 174 lb 12.8 oz (79.3 kg)   SpO2 97%   BMI 29.09 kg/m  BP Readings from Last 3 Encounters:  10/09/23 120/70  09/10/23 118/80  07/04/23 (!) 118/58   Wt Readings from Last 3 Encounters:  10/09/23 174 lb 12.8 oz (79.3 kg)  09/10/23 177 lb 6.4 oz (80.5 kg)  07/30/23 178 lb 6 oz (80.9 kg)   SpO2 Readings from Last 3 Encounters:  10/09/23 97%  09/10/23 99%  07/04/23 98%      Physical Exam Vitals and nursing note reviewed.  Constitutional:      General: She is not in acute distress.    Appearance: Normal appearance. She is well-developed.  HENT:     Head: Normocephalic and atraumatic.  Eyes:     General: No scleral icterus.       Right eye: No discharge.        Left eye: No discharge.  Cardiovascular:     Rate and Rhythm: Normal rate and regular rhythm.     Heart sounds: No murmur heard. Pulmonary:     Effort: Pulmonary effort is normal. No respiratory distress.     Breath sounds: Normal breath sounds.  Musculoskeletal:        General: Normal range of motion.     Cervical back: Normal range of motion and neck supple.     Right lower leg: No edema.     Left lower leg: No edema.  Skin:    General: Skin is warm and dry.  Neurological:     Mental Status: She is alert and oriented to person, place, and time.  Psychiatric:         Mood and Affect: Mood normal.        Behavior: Behavior normal.        Thought Content: Thought content normal.        Judgment: Judgment normal.      No results found for any visits on 10/09/23.  Last CBC Lab Results  Component Value Date   WBC 5.8 09/10/2023   HGB 13.8 09/10/2023   HCT 41.1 09/10/2023   MCV 94.2 09/10/2023   MCH 30.9 05/09/2023   RDW 13.9 09/10/2023   PLT 292.0 09/10/2023   Last metabolic panel Lab Results  Component Value Date   GLUCOSE 98 09/10/2023   NA 141 09/10/2023   K 4.1 09/10/2023   CL 105 09/10/2023   CO2 28 09/10/2023   BUN 13 09/10/2023   CREATININE 0.83 09/10/2023   GFR 71.86 09/10/2023   CALCIUM 9.7 09/10/2023   PROT 7.0 09/10/2023   ALBUMIN 4.5 09/10/2023   BILITOT 0.6 09/10/2023   ALKPHOS 92 09/10/2023   AST 19 09/10/2023   ALT 15 09/10/2023   ANIONGAP 8 05/09/2023   Last lipids Lab Results  Component Value Date   CHOL 210 (H) 09/10/2023   HDL 58.90 09/10/2023   LDLCALC 128 (H) 09/10/2023   LDLDIRECT 129.0 08/04/2019   TRIG 113.0 09/10/2023   CHOLHDL 4 09/10/2023   Last hemoglobin A1c Lab Results  Component Value Date   HGBA1C 5.7 08/05/2020   Last thyroid  functions Lab Results  Component Value Date   TSH  2.13 09/10/2023   Last vitamin D  Lab Results  Component Value Date   VD25OH 64.31 08/05/2020   Last vitamin B12 and Folate No results found for: "VITAMINB12", "FOLATE"    The 10-year ASCVD risk score (Arnett DK, et al., 2019) is: 7.6%    Assessment & Plan:   Problem List Items Addressed This Visit       Unprioritized   Anxiety - Primary   Impproved  Con't zoloft        Assessment and Plan Assessment & Plan Anxiety   Anxiety is present with occasional anxious behaviors like finger movements. She reports overall improvement and satisfaction with the current sertraline  dosage. Continue current dosage of sertraline .  Sleep disturbance   She has difficulty falling asleep, but sleep is adequate  once asleep. She uses melatonin and magnesium at night and is satisfied with this management. Continue melatonin and magnesium at night.  Hyperlipidemia   Hyperlipidemia is being monitored with a follow-up cholesterol check planned in three months. Recheck cholesterol levels in three months.    Return if symptoms worsen or fail to improve.    Muriel Wilber R Lowne Chase, DO

## 2023-10-09 NOTE — Assessment & Plan Note (Signed)
 Impproved  Con't zoloft 

## 2023-11-06 ENCOUNTER — Ambulatory Visit (INDEPENDENT_AMBULATORY_CARE_PROVIDER_SITE_OTHER)

## 2023-11-06 VITALS — Ht 65.0 in | Wt 174.0 lb

## 2023-11-06 DIAGNOSIS — Z0000 Encounter for general adult medical examination without abnormal findings: Secondary | ICD-10-CM

## 2023-11-06 NOTE — Patient Instructions (Signed)
 Ms. Katherine Hanna , Thank you for taking time out of your busy schedule to complete your Annual Wellness Visit with me. I enjoyed our conversation and look forward to speaking with you again next year. I, as well as your care team,  appreciate your ongoing commitment to your health goals. Please review the following plan we discussed and let me know if I can assist you in the future. Your Game plan/ To Do List    Follow up Visits: Next Medicare AWV with our clinical staff: In 1 year    Have you seen your provider in the last 6 months (3 months if uncontrolled diabetes)? Yes Next Office Visit with your provider: 09/11/24 @ 8:40  Clinician Recommendations:  Aim for 30 minutes of exercise or brisk walking, 6-8 glasses of water, and 5 servings of fruits and vegetables each day.       This is a list of the screening recommended for you and due dates:  Health Maintenance  Topic Date Due   Colon Cancer Screening  10/12/2019   COVID-19 Vaccine (7 - 2024-25 season) 03/18/2023   Mammogram  12/19/2023   Flu Shot  12/21/2023   Medicare Annual Wellness Visit  11/05/2024   DEXA scan (bone density measurement)  12/18/2024   DTaP/Tdap/Td vaccine (3 - Td or Tdap) 07/31/2027   Pneumococcal Vaccine for age over 34  Completed   Hepatitis C Screening  Completed   Zoster (Shingles) Vaccine  Completed   HPV Vaccine  Aged Out   Meningitis B Vaccine  Aged Out    Advanced directives: (ACP Link)Information on Advanced Care Planning can be found at [place]  Secretary of [facility] Advance Health Care Directives Advance Health Care Directives. [email or web address]   Advance Care Planning is important because it:  [x]  Makes sure you receive the medical care that is consistent with your values, goals, and preferences  [x]  It provides guidance to your family and loved ones and reduces their decisional burden about whether or not they are making the right decisions based on your wishes.  Follow the link provided in your after  visit summary or read over the paperwork we have mailed to you to help you started getting your Advance Directives in place. If you need assistance in completing these, please reach out to us  so that we can help you!  See attachments for Preventive Care and Fall Prevention Tips.

## 2023-11-06 NOTE — Progress Notes (Signed)
 Subjective:   Katherine Hanna is a 68 y.o. who presents for a Medicare Wellness preventive visit.  As a reminder, Annual Wellness Visits don't include a physical exam, and some assessments may be limited, especially if this visit is performed virtually. We may recommend an in-person follow-up visit with your provider if needed.  Visit Complete: Virtual I connected with  Giah Fickett Martello on 11/06/23 by a audio enabled telemedicine application and verified that I am speaking with the correct person using two identifiers.  Patient Location: Home  Provider Location: Home Office  I discussed the limitations of evaluation and management by telemedicine. The patient expressed understanding and agreed to proceed.  Vital Signs: Because this visit was a virtual/telehealth visit, some criteria may be missing or patient reported. Any vitals not documented were not able to be obtained and vitals that have been documented are patient reported.  VideoDeclined- This patient declined Librarian, academic. Therefore the visit was completed with audio only.  Persons Participating in Visit: Patient.  AWV Questionnaire: Yes: Patient Medicare AWV questionnaire was completed by the patient on 11/06/23; I have confirmed that all information answered by patient is correct and no changes since this date.  Cardiac Risk Factors include: advanced age (>62men, >84 women)     Objective:    Today's Vitals   11/06/23 1400  Weight: 174 lb (78.9 kg)  Height: 5' 5 (1.651 m)   Body mass index is 28.96 kg/m.     11/06/2023    2:16 PM 08/15/2022    1:00 PM 08/11/2021    9:37 AM 02/07/2021    8:36 AM 08/05/2020    9:27 AM 06/10/2020    8:21 AM 02/19/2020    8:12 AM  Advanced Directives  Does Patient Have a Medical Advance Directive? No Yes Yes No Yes No Yes  Type of Special educational needs teacher of [place];Living will;Out of facility DNR (pink MOST or yellow form) Healthcare Power of  [place];Living will;Out of facility DNR (pink MOST or yellow form)  Healthcare Power of [place];Living will  Healthcare Power of [place];Living will  Does patient want to make changes to medical advance directive?   No - Patient declined  No - Patient declined    Copy of Healthcare Power of Attorney in Chart?  No - copy requested No - copy requested  No - copy requested  No - copy requested  Would patient like information on creating a medical advance directive? Yes (MAU/Ambulatory/Procedural Areas - Information given)     No - Patient declined     Current Medications (verified) Outpatient Encounter Medications as of 11/06/2023  Medication Sig   albuterol  (VENTOLIN  HFA) 108 (90 Base) MCG/ACT inhaler Inhale 2 puffs into the lungs every 6 (six) hours as needed for wheezing or shortness of breath.   anastrozole  (ARIMIDEX ) 1 MG tablet TAKE 1 TABLET BY MOUTH EVERY DAY   Calcium Carbonate-Vitamin D  (CALCIUM 500 + D PO) Take 1 tablet by mouth 2 (two) times daily.   cetirizine (ZYRTEC) 10 MG tablet Take 10 mg by mouth at bedtime.   co-enzyme Q-10 30 MG capsule Take 30 mg by mouth daily.   fluticasone  (FLONASE ) 50 MCG/ACT nasal spray SPRAY 2 SPRAYS INTO EACH NOSTRIL EVERY DAY   fluticasone -salmeterol (WIXELA INHUB) 250-50 MCG/ACT AEPB Inhale 1 puff into the lungs in the morning and at bedtime.   Misc Natural Products (TART CHERRY ADVANCED PO) Take by mouth.   Multiple Vitamin (MULTIVITAMIN) tablet Take 1 tablet  by mouth daily.   promethazine -dextromethorphan (PROMETHAZINE -DM) 6.25-15 MG/5ML syrup Take 5 mLs by mouth 3 (three) times daily as needed for cough.   sertraline  (ZOLOFT ) 25 MG tablet TAKE 1 TABLET (25 MG TOTAL) BY MOUTH DAILY.   TURMERIC PO Take by mouth.   No facility-administered encounter medications on file as of 11/06/2023.    Allergies (verified) Penicillins   History: Past Medical History:  Diagnosis Date   Allergic rhinitis    Allergy 1969   Arthritis    knee   Asthma     Breast cancer (HCC)    Cataract 2021   Depression    GERD (gastroesophageal reflux disease) 2019   History of chicken pox    Migraine    Rheumatic fever    as a child.   Ulcer 2019   Past Surgical History:  Procedure Laterality Date   APPENDECTOMY  11/1971   BREAST LUMPECTOMY WITH RADIOACTIVE SEED AND SENTINEL LYMPH NODE BIOPSY Right 01/20/2020   Procedure: RIGHT BREAST LUMPECTOMY WITH RADIOACTIVE SEED AND SENTINEL LYMPH NODE BIOPSY;  Surgeon: Lockie Rima, MD;  Location:  SUR[facility]ERY [facility];  Service: General;  Laterality: Right;   BREAST SURGERY  01/20/2020   COLONOSCOPY     EYE SURGERY  08/21/2023   Torn Retina   RETINAL TEAR REPAIR CRYOTHERAPY Left    TONSILLECTOMY     when she was 2 or 3   Family History  Problem Relation Age of Onset   Lung cancer Mother        smoked   Cancer Mother    COPD Mother    Diabetes Father    Stroke Father    Cancer Father        bladder   Leukemia Father    Stroke Brother    Stroke Maternal Grandmother        40s   Coronary artery disease Maternal Grandfather    Stroke Paternal Grandmother    Colon cancer Maternal Uncle    Breast cancer Paternal Aunt    Stroke Brother    ADD / ADHD Son    Esophageal cancer Neg Hx    Social History   Socioeconomic History   Marital status: Married    Spouse name: Not on file   Number of children: 2   Years of education: Not on file   Highest education level: Bachelor's degree (e.g., BA, AB, BS)  Occupational History   Occupation: IT trainer  Tobacco Use   Smoking status: Never   Smokeless tobacco: Never   Tobacco comments:    Both parents smoked  Vaping Use   Vaping status: Never Used  Substance and Sexual Activity   Alcohol use: No   Drug use: Never   Sexual activity: Not Currently    Partners: Male    Birth control/protection: Post-menopausal  Other Topics Concern   Not on file  Social History Narrative   Exercise---   chair exercise, line dancing class    Social Drivers of  Health   Financial Resource Strain: Low Risk  (11/06/2023)   Overall Financial Resource Strain (CARDIA)    Difficulty of Paying Living Expenses: Not hard at all  Food Insecurity: No Food Insecurity (11/06/2023)   Hunger Vital Sign    Worried About Running Out of Food in the Last Year: Never true    Ran Out of Food in the Last Year: Never true  Transportation Needs: No Transportation Needs (11/06/2023)   PRAPARE - Administrator, Hanna Service (Medical):  No    Lack of Transportation (Non-Medical): No  Physical Activity: Sufficiently Active (11/06/2023)   Exercise Vital Sign    Days of Exercise per Week: 2 days    Minutes of Exercise per Session: 90 min  Stress: Stress Concern Present (11/06/2023)   [employer] of Occupational Health - Occupational Stress Questionnaire    Feeling of Stress: To some extent  Social Connections: Socially Integrated (11/06/2023)   Social Connection and Isolation Panel    Frequency of Communication with Friends and Family: Once a week    Frequency of Social Gatherings with Friends and Family: Three times a week    Attends Religious Services: More than 4 times per year    Active Member of Clubs or Organizations: Yes    Attends Banker Meetings: More than 4 times per year    Marital Status: Married    Tobacco Counseling Counseling given: Not Answered Tobacco comments: Both parents smoked    Clinical Intake:  Pre-visit preparation completed: Yes  Pain : No/denies pain     Diabetes: No  Lab Results  Component Value Date   HGBA1C 5.7 08/05/2020     How often do you need to have someone help you when you read instructions, pamphlets, or other written materials from your doctor or pharmacy?: 1 - Never  Interpreter Needed?: No  Information entered by :: Seabron Cypress LPN   Activities of Daily Living     11/06/2023    1:42 PM  In your present state of health, do you have any difficulty performing the following  activities:  Hearing? 0  Vision? 0  Difficulty concentrating or making decisions? 0  Walking or climbing stairs? 0  Dressing or bathing? 0  Doing errands, shopping? 0  Preparing Food and eating ? N  Using the Toilet? N  In the past six months, have you accidently leaked urine? Y  Do you have problems with loss of bowel control? N  Managing your Medications? N  Managing your Finances? N  Housekeeping or managing your Housekeeping? N    Patient Care Team: Crecencio Dodge, Candida Chalk, DO as PCP - General (Family Medicine) Hollar, Carlin Bullard, MD as Referring Physician (Dermatology) Murtis Arthur, OD as Consulting Physician (Optometry) Sonja [place], MD as Consulting Physician (Hematology) Burton, Lacie K, NP as Nurse Practitioner (Nurse Practitioner) Wilfredo Hanly, MD as Consulting Physician (Pulmonary Disease) Lajuan Pila, MD as Consulting Physician (Gastroenterology) Mammography, [facility] as Radiologist (Diagnostic Radiology) Linard Reno, MD as Consulting Physician (Ophthalmology)  I have updated your Care Teams any recent Medical Services you may have received from other providers in the past year.     Assessment:   This is a routine wellness examination for [place].  Hearing/Vision screen Hearing Screening - Comments:: Denies hearing difficulties   Vision Screening - Comments:: Wears rx glasses - up to date with routine eye exams with Dr. Elnita Hai    Goals Addressed             This Visit's Progress    Remain active and independent   On track      Depression Screen     11/06/2023    2:14 PM 10/09/2023    2:56 PM 09/10/2023    8:31 AM 09/08/2022    8:21 AM 08/15/2022    1:03 PM 09/06/2021    8:19 AM 08/11/2021    9:38 AM  PHQ 2/9 Scores  PHQ - 2 Score 0 0 0 0 0 0 0  PHQ-  9 Score 4 4  0       Fall Risk     11/06/2023    1:42 PM 09/10/2023    8:31 AM 05/10/2023   10:29 AM 08/15/2022    1:01 PM 09/06/2021    8:19 AM  Fall Risk   Falls in the past year? 0 0 0 0  0  Number falls in past yr: 0 0  0 0  Injury with Fall? 0 0  0 0  Risk for fall due to : No Fall Risks   No Fall Risks   Follow up Falls prevention discussed;Education provided;Falls evaluation completed Falls evaluation completed  Falls evaluation completed Falls evaluation completed      Data saved with a previous flowsheet row definition    MEDICARE RISK AT HOME:  Medicare Risk at Home Any stairs in or around the home?: (Patient-Rptd) Yes If so, are there any without handrails?: (Patient-Rptd) Yes Home free of loose throw rugs in walkways, pet beds, electrical cords, etc?: (Patient-Rptd) Yes Adequate lighting in your home to reduce risk of falls?: (Patient-Rptd) Yes Life alert?: (Patient-Rptd) No Use of a cane, walker or w/c?: (Patient-Rptd) No Grab bars in the bathroom?: (Patient-Rptd) Yes Shower chair or bench in shower?: (Patient-Rptd) No Elevated toilet seat or a handicapped toilet?: (Patient-Rptd) Yes  TIMED UP AND GO:  Was the test performed?  No  Cognitive Function: 6CIT completed    08/05/2020    9:26 AM  MMSE - Mini Mental State Exam  Orientation to time 5  Orientation to Place 5  Registration 3  Attention/ Calculation 5  Recall 3  Language- name 2 objects 2  Language- repeat 1  Language- follow 3 step command 3  Language- read & follow direction 1  Write a sentence 1  Copy design 1  Total score 30        11/06/2023    2:16 PM 08/15/2022    1:08 PM 08/11/2021    9:38 AM  6CIT Screen  What Year? 0 points 0 points 0 points  What month? 0 points 0 points 0 points  What time? 0 points 0 points 0 points  Count back from 20 0 points 0 points 0 points  Months in reverse 0 points 0 points 0 points  Repeat phrase 0 points 0 points 0 points  Total Score 0 points 0 points 0 points    Immunizations Immunization History  Administered Date(s) Administered   Fluad Quad(high Dose 65+) 03/28/2019, 02/03/2022, 01/21/2023   Hep A / Hep B 03/15/2010, 07/25/2010    Influenza Whole 02/19/2013   Influenza, High Dose Seasonal PF 03/05/2020   Influenza, Seasonal, Injecte, Preservative Fre 03/13/2014   Influenza,Quad,Nasal, Live 03/28/2019, 02/03/2022   Influenza,inj,Quad PF,6+ Mos 03/13/2014   Influenza-Unspecified 03/23/2015, 03/28/2017, 03/14/2018   Moderna Covid-19 Vaccine Bivalent Booster 3yrs & up 03/09/2021   Moderna Sars-Covid-2 Vaccination 07/04/2019, 07/28/2019, 03/19/2020, 12/10/2020, 03/09/2021, 01/21/2023   PNEUMOCOCCAL CONJUGATE-20 09/06/2021   Pneumococcal Conjugate PCV 7 09/06/2021   Pneumococcal Conjugate-13 11/21/2019   Pneumococcal Polysaccharide-23 06/18/2015   Respiratory Syncytial Virus Vaccine,Recomb Aduvanted(Arexvy) 09/12/2022   Rsv, Bivalent, Protein Subunit Rsvpref,pf Pattricia Bores) 10/21/2022   Tdap 09/02/2006, 07/30/2017   Zoster Recombinant(Shingrix ) 08/02/2018, 12/09/2018   Zoster, Live 04/28/2014, 08/02/2018, 12/09/2018    Screening Tests Health Maintenance  Topic Date Due   Colonoscopy  10/12/2019   COVID-19 Vaccine (7 - 2024-25 season) 03/18/2023   MAMMOGRAM  12/19/2023   INFLUENZA VACCINE  12/21/2023   Medicare Annual Wellness (AWV)  11/05/2024  DEXA SCAN  12/18/2024   DTaP/Tdap/Td (3 - Td or Tdap) 07/31/2027   Pneumococcal Vaccine: 50+ Years  Completed   Hepatitis C Screening  Completed   Zoster Vaccines- Shingrix   Completed   HPV VACCINES  Aged Out   Meningococcal B Vaccine  Aged Out    Health Maintenance  Health Maintenance Due  Topic Date Due   Colonoscopy  10/12/2019   COVID-19 Vaccine (7 - 2024-25 season) 03/18/2023    Additional Screening:  Vision Screening: Recommended annual ophthalmology exams for early detection of glaucoma and other disorders of the eye. Would you like a referral to an eye doctor? No    Dental Screening: Recommended annual dental exams for proper oral hygiene  Community Resource Referral / Chronic Care Management: CRR required this visit?  No   CCM required this visit?   No   Plan:    I have personally reviewed and noted the following in the patient's chart:   Medical and social history Use of alcohol, tobacco or illicit drugs  Current medications and supplements including opioid prescriptions. Patient is not currently taking opioid prescriptions. Functional ability and status Nutritional status Physical activity Advanced directives List of other physicians Hospitalizations, surgeries, and ER visits in previous 12 months Vitals Screenings to include cognitive, depression, and falls Referrals and appointments  In addition, I have reviewed and discussed with patient certain preventive protocols, quality metrics, and best practice recommendations. A written personalized care plan for preventive services as well as general preventive health recommendations were provided to patient.   Seabron Cypress [place], [place]   [id number]   After Visit Summary: (MyChart) Due to this being a telephonic visit, the after visit summary with patients personalized plan was offered to patient via MyChart   Notes: Nothing significant to report at this time.

## 2023-11-07 ENCOUNTER — Other Ambulatory Visit: Payer: Self-pay | Admitting: Pulmonary Disease

## 2023-11-07 ENCOUNTER — Other Ambulatory Visit: Payer: Self-pay | Admitting: Nurse Practitioner

## 2023-11-07 DIAGNOSIS — J310 Chronic rhinitis: Secondary | ICD-10-CM

## 2023-11-07 DIAGNOSIS — C50211 Malignant neoplasm of upper-inner quadrant of right female breast: Secondary | ICD-10-CM

## 2023-11-07 NOTE — Assessment & Plan Note (Signed)
-  Diagnosed in 11/2019 with Invasive ductal carcinoma with DCIS.  S/p right lumpectomy and SLNB with Dr Cherlynn Cornfield on 01/20/20. Her Mammaprint was low risk Luminal Type A. Adjuvant chemotherapy was not recommended given her low risk disease. She also completed adjuvant radiation.  -She did not have genetic testing.  -She began adjuvant antiestrogen therapy with Anastrozole  in 04/2020, tolerating well except hot flashes but manageable.  -12/19/2022 - Diagnostic mammogram was done  Ultrasound of right breast was performed due to nodular scar tissue that was seen.  Ultimate results were negative.   - 12/19/2022 -DEXA scan done. Results were consistent with osteopenia/low bone density --11/08/2023 - add boniva monthly. Expect to repeat DEXA scan in 11/2024.  -new 3D diagnostic mammogram ordered for 11/2023.  - Continue anastrozole  1 mg daily. -Labs and follow-up in 6 months, sooner if needed.

## 2023-11-07 NOTE — Progress Notes (Unsigned)
 Patient Care Team: Crecencio Dodge, Candida Chalk, DO as PCP - General (Family Medicine) Hollar, Earla Glassman, MD as Referring Physician (Dermatology) Murtis Arthur, OD as Consulting Physician (Optometry) Sonja Clarendon, MD as Consulting Physician (Hematology) Burton, Lacie K, NP as Nurse Practitioner (Nurse Practitioner) Wilfredo Hanly, MD as Consulting Physician (Pulmonary Disease) Lajuan Pila, MD as Consulting Physician (Gastroenterology) Mammography, [facility] as Radiologist (Diagnostic Radiology) Linard Reno, MD as Consulting Physician (Ophthalmology)  Clinic Day:  11/08/2023  Referring physician: Crecencio Dodge, Candida Chalk, *  ASSESSMENT & PLAN:   Assessment & Plan: Malignant neoplasm of upper-inner quadrant of right breast in female, estrogen receptor positive (HCC) -Diagnosed in 11/2019 with Invasive ductal carcinoma with DCIS.  S/p right lumpectomy and SLNB with Dr Cherlynn Cornfield on 01/20/20. Her Mammaprint was low risk Luminal Type A. Adjuvant chemotherapy was not recommended given her low risk disease. She also completed adjuvant radiation.  -She did not have genetic testing.  -She began adjuvant antiestrogen therapy with Anastrozole  in 04/2020, tolerating well except hot flashes but manageable.  -12/19/2022 - Diagnostic mammogram was done  Ultrasound of right breast was performed due to nodular scar tissue that was seen.  Ultimate results were negative.   - 12/19/2022 -DEXA scan done. Results were consistent with osteopenia/low bone density --11/08/2023 - add boniva monthly. Expect to repeat DEXA scan in 11/2024.  -new 3D diagnostic mammogram ordered for 11/2023.  - Continue anastrozole  1 mg daily. -Labs and follow-up in 6 months, sooner if needed.    Osteopenia Reviewed bone density test from 11/2022 which shows she has osteopenia. Will add boniva monthly. Prescription sent to her pharmacy. She sees a dentist for routine checks every 6 months. She will notify me of any problems or negative side  effects.   Plan: Reviewed labs. - CBC and CMP are normal/unremarkable. Reviewed diagnostic mammogram from 11/2022 which was benign. New diagnostic mammogram ordered for 11/2023.  She goes to [facility] mammography. Add Boniva monthly for bone health.  Expect to repeat bone density in 11/2024.  Continue anastrozole  1 mg daily. Plan to follow-up with labs in 6 months, sooner if needed.  The patient understands the plans discussed today and is in agreement with them.  She knows to contact our office if she develops concerns prior to her next appointment.  I provided 25 minutes of face-to-face time during this encounter and > 50% was spent counseling as documented under my assessment and plan.    Sharyon Deis, NP  [facility] - A DEPT OF [facility] [address] [place] [address] Dept: [phone] Dept Fax: [phone]   Orders Placed This Encounter  Procedures   MM 3D DIAGNOSTIC MAMMOGRAM BILATERAL BREAST    Standing Status:   Future    Expected Date:   12/08/2023    Expiration Date:   11/07/2024    Reason for Exam (SYMPTOM  OR DIAGNOSIS REQUIRED):   breast cancer follow up    Preferred imaging location?:   External             patient goes to [facility]      CHIEF COMPLAINT:  CC: Right breast cancer, estrogen receptor positive  Current Treatment: Anastrozole  1 mg daily (started December 2021)  INTERVAL HISTORY:  Katherine Hanna is here today for repeat clinical assessment.  She was last seen by me on 05/09/2023.  Diagnostic mammogram was done 12/19/2022.  Ultrasound of right breast was performed due to  nodular scar tissue that was seen.  Ultimate results were negative.  She also had bone density test done on 12/19/2022.  Results were consistent with osteopenia/low bone density.  She continues to take anastrozole  1 mg daily.  Tolerating well.  Has intermittent hot flashes.  Some days she has several hot flashes and  a single 24-hour period.  Other days, she has no hot flashes at all.  She denies unusual joint pain or bone pain.  She has not noticed any changes or lumps in the breasts.  She denies chest pain, chest pressure, or shortness of breath. She denies headaches or visual disturbances. She denies abdominal pain, nausea, vomiting, or changes in bowel or bladder habits.  She denies fevers or chills. She denies pain. Her appetite is good. Her weight has been stable.  I have reviewed the past medical history, past surgical history, social history and family history with the patient and they are unchanged from previous note.  ALLERGIES:  is allergic to penicillins.  MEDICATIONS:  Current Outpatient Medications  Medication Sig Dispense Refill   ibandronate (BONIVA) 150 MG tablet Take 1 tablet (150 mg total) by mouth every 30 (thirty) days. Take in the morning with a full glass of water, on an empty stomach, and do not take anything else by mouth or lie down for the next 30 min. 3 tablet 3   albuterol  (VENTOLIN  HFA) 108 (90 Base) MCG/ACT inhaler Inhale 2 puffs into the lungs every 6 (six) hours as needed for wheezing or shortness of breath. 8 g 0   anastrozole  (ARIMIDEX ) 1 MG tablet TAKE 1 TABLET BY MOUTH EVERY DAY 90 tablet 3   Calcium Carbonate-Vitamin D  (CALCIUM 500 + D PO) Take 1 tablet by mouth 2 (two) times daily.     cetirizine (ZYRTEC) 10 MG tablet Take 10 mg by mouth at bedtime.     co-enzyme Q-10 30 MG capsule Take 30 mg by mouth daily.     fluticasone  (FLONASE ) 50 MCG/ACT nasal spray SPRAY 2 SPRAYS INTO EACH NOSTRIL EVERY DAY 48 mL 1   fluticasone -salmeterol (WIXELA INHUB) 250-50 MCG/ACT AEPB Inhale 1 puff into the lungs in the morning and at bedtime. 60 each 11   Misc Natural Products (TART CHERRY ADVANCED PO) Take by mouth.     Multiple Vitamin (MULTIVITAMIN) tablet Take 1 tablet by mouth daily.     promethazine -dextromethorphan (PROMETHAZINE -DM) 6.25-15 MG/5ML syrup Take 5 mLs by mouth 3 (three)  times daily as needed for cough. 180 mL 1   sertraline  (ZOLOFT ) 25 MG tablet TAKE 1 TABLET (25 MG TOTAL) BY MOUTH DAILY. 90 tablet 2   TURMERIC PO Take by mouth.     No current facility-administered medications for this visit.    HISTORY OF PRESENT ILLNESS:   Oncology History Overview Note  Cancer Staging Malignant neoplasm of upper-inner quadrant of right breast in female, estrogen receptor positive (HCC) Staging form: Breast, AJCC 8th Edition - Clinical stage from 12/03/2019: Stage IA (cT1c, cN0, cM0, G2, ER+, PR+, HER2-) - Signed by Sonja [place], MD on 12/09/2019 - Pathologic stage from 01/20/2020: Stage IA (pT1c, pN1a, cM0, G2, ER+, PR+, HER2-) - Signed by Sonja [place], MD on 04/06/2020    Malignant neoplasm of upper-inner quadrant of right breast in female, estrogen receptor positive (HCC)  11/25/2019 Mammogram   Mammogram and US  11/25/19  IMPRESSION The 1.1x0.8x0.9cm architectural distortion in the right breast at 3:00 position middle depth 4 cm from nipple is highly suggestive of malignancy. An US  guided biopsy  is recommended.     12/03/2019 Initial Biopsy   Diagnosis 12/03/19 Breast, right, needle core biopsy, 3 o'clock, 4 cmfn - INVASIVE DUCTAL CARCINOMA. - DUCTAL CARCINOMA IN SITU. Microscopic Comment The carcinoma appears grade 2. The greatest linear extent of tumor in any one core is 10 mm. Ancillary studies will be reported separately. Results reported to [employer] on 12/04/2019. Intradepartmental consultation (Dr. Randal Bury).   12/03/2019 Receptors her2   PROGNOSTIC INDICATORS Results: IMMUNOHISTOCHEMICAL AND MORPHOMETRIC ANALYSIS PERFORMED MANUALLY The tumor cells are EQUIVOCAL for Her2 (2+). Her2 by FISH will be performed and results reported separately. Estrogen Receptor: 95%, POSITIVE, STRONG STAINING INTENSITY Progesterone Receptor: 80%, POSITIVE, STRONG STAINING INTENSITY Proliferation Marker Ki67: 10%   FLUORESCENCE IN-SITU HYBRIDIZATION Results: GROUP  5: HER2 **NEGATIVE** Equivocal form of amplification of the HER2 gene was detected in the IHC 2+ tissue sample received from this individual. HER2 FISH was performed by a technologist and cell imaging and analysis on the BioView.   12/03/2019 Cancer Staging   Staging form: Breast, AJCC 8th Edition - Clinical stage from 12/03/2019: Stage IA (cT1c, cN0, cM0, G2, ER+, PR+, HER2-) - Signed by Sonja [place], MD on 12/09/2019   12/08/2019 Initial Diagnosis   Malignant neoplasm of upper-inner quadrant of right breast in female, estrogen receptor positive (HCC)   01/20/2020 Surgery   RIGHT BREAST LUMPECTOMY WITH RADIOACTIVE SEED AND SENTINEL LYMPH NODE BIOPSY by Cherlynn Cornfield    01/20/2020 Pathology Results   FINAL MICROSCOPIC DIAGNOSIS:   A. BREAST, RIGHT, LUMPECTOMY:  - Invasive ductal carcinoma, grade 2, spanning 1.3 cm.  - Intermediate grade ductal carcinoma in situ.  - Invasive carcinoma is <1 mm from the lateral margin focally.  - In situ carcinoma is 3 mm from the lateral margin focally.  - Biopsy site.  - Fibrocystic change and usual ductal hyperplasia.  - See oncology table.   B. LYMPH NODE, RIGHT AXILLARY #1, SENTINEL, EXCISION:  - Metastatic carcinoma in one of one lymph nodes (1/1).  - Focal extracapsular extension.   C. LYMPH NODE, RIGHT AXILLARY #2, SENTINEL, EXCISION:  - One of one lymph nodes negative for carcinoma (0/1).   D. LYMPH NODE, RIGHT AXILLARY #3, SENTINEL, EXCISION:  - One of one lymph nodes negative for carcinoma (0/1).   E. LYMPH NODE, RIGHT AXILLARY #4, SENTINEL, EXCISION:  - One of one lymph nodes negative for carcinoma (0/1).     ADDENDUM:   By immunohistochemistry, HER-2 is EQUIVOCAL (2+).  HER-2 by FISH is  pending and will be reported in an addendum.   ADDENDUM:   FLOURESCENCE IN-SITU HYBRIDIZATION RESULTS:   GROUP 5:   HER2 **NEGATIVE**    01/20/2020 Miscellaneous   Mammaprint Low risk Luminal Type A - MPI at +0.311 She has 97.8% benefit of Horomal  Therpay alone    01/20/2020 Cancer Staging   Staging form: Breast, AJCC 8th Edition - Pathologic stage from 01/20/2020: Stage IA (pT1c, pN1a, cM0, G2, ER+, PR+, HER2-) - Signed by Sonja [place], MD on 04/06/2020   02/26/2020 - 04/13/2020 Radiation Therapy   Adjuvant Radiation with Dr Eloise Hake    04/2020 -  Anti-estrogen oral therapy   Anastrozole  1mg  daily starting in 04/2020    07/13/2020 Survivorship   SCP delivered virtually by Lacie Burton, NP        REVIEW OF SYSTEMS:   Constitutional: Denies fevers, chills or abnormal weight loss Eyes: Denies blurriness of vision Ears, nose, mouth, throat, and face: Denies mucositis or sore throat Respiratory: Denies cough, dyspnea or  wheezes Cardiovascular: Denies palpitation, chest discomfort or lower extremity swelling Gastrointestinal:  Denies nausea, heartburn or change in bowel habits Skin: Denies abnormal skin rashes Lymphatics: Denies new lymphadenopathy or easy bruising Neurological:Denies numbness, tingling or new weaknesses Behavioral/Psych: Mood is stable, no new changes  All other systems were reviewed with the patient and are negative.   VITALS:   Today's Vitals   11/08/23 0826 11/08/23 0827  BP: 110/68   Pulse: 86   Resp: 15   Temp: 98.7 F (37.1 C)   TempSrc: Temporal   SpO2: 99%   Weight: 176 lb 3.2 oz (79.9 kg)   Height: 5' 5 (1.651 m)   PainSc:  0-No pain   Body mass index is 29.32 kg/m.   Wt Readings from Last 3 Encounters:  11/08/23 176 lb 3.2 oz (79.9 kg)  11/06/23 174 lb (78.9 kg)  10/09/23 174 lb 12.8 oz (79.3 kg)    Body mass index is 29.32 kg/m.  Performance status (ECOG): 1 - Symptomatic but completely ambulatory  PHYSICAL EXAM:   GENERAL:alert, no distress and comfortable SKIN: skin color, texture, turgor are normal, no rashes or significant lesions EYES: normal, Conjunctiva are pink and non-injected, sclera clear OROPHARYNX:no exudate, no erythema and lips, buccal mucosa, and tongue normal   NECK: supple, thyroid  normal size, non-tender, without nodularity LYMPH:  no palpable lymphadenopathy in the cervical, axillary or inguinal LUNGS: clear to auscultation and percussion with normal breathing effort HEART: regular rate & rhythm and no murmurs and no lower extremity edema ABDOMEN:abdomen soft, non-tender and normal bowel sounds Musculoskeletal:no cyanosis of digits and no clubbing  NEURO: alert & oriented x 3 with fluent speech, no focal motor/sensory deficits BREAST: The right breast is comprised of mostly fibrous tissue without discrete lumps or masses.  There is no nipple inversion or nipple discharge.  There is well-healed axillary surgical scar without axillary lymphadenopathy on the right.  The left breast is without palpable masses or lumps today.  There is no nipple inversion or nipple discharge.  There is no axillary lymphadenopathy on the left side.  LABORATORY DATA:  I have reviewed the data as listed    Component Value Date/Time   NA 142 11/08/2023 0800   K 4.2 11/08/2023 0800   CL 107 11/08/2023 0800   CO2 27 11/08/2023 0800   GLUCOSE 94 11/08/2023 0800   BUN 13 11/08/2023 0800   CREATININE 0.90 11/08/2023 0800   CALCIUM 9.8 11/08/2023 0800   PROT 7.3 11/08/2023 0800   ALBUMIN 4.4 11/08/2023 0800   AST 19 11/08/2023 0800   ALT 15 11/08/2023 0800   ALKPHOS 99 11/08/2023 0800   BILITOT 0.5 11/08/2023 0800   GFRNONAA >60 11/08/2023 0800   GFRAA >60 12/10/2019 1225    Lab Results  Component Value Date   WBC 5.9 11/08/2023   NEUTROABS 3.9 11/08/2023   HGB 13.5 11/08/2023   HCT 40.6 11/08/2023   MCV 92.1 11/08/2023   PLT 290 11/08/2023

## 2023-11-07 NOTE — Progress Notes (Signed)
 Adult prophylaxis [facility] 11/07/23  Dental procedures in this visit  . I8889 - PROPHYLAXIS - ADULT  . I9879 - PERIODIC ORAL EVALUATION - ESTABLISHED PATIENT  . I9725 - BITEWINGS - FOUR RADIOGRAPHIC IMAGES   HEALTH HISTORY ? Vitals:  BP Readings from Last 1 Encounters:  11/07/23 119/72  Pulse:  97  Medical history was reviewed and updated. No contraindication to care.  Medical History[1] Surgical History[2] Social History[3] Family History[4] Medications Ordered Prior to Encounter[5]  Medical Risk Assessment ASA GRADE: ASA 2 - Patient with mild systemic disease with no functional limitations  Subjective: Patient reports no change to condition since last visit.   Objective:  Radiographs taken: 2-4 BWs.   Dental Examination Dr. Trish completed an exam and noted the following conditions and/or findings: no new abnormalities noted today Intraoral exam completed and findings: geographic tongue. Extraoral exam completed and findings: wnl.  Periodontal Evaluation Periodontal Charting: Updated full mouth periodontal charting and documented all findings in patient's tooth chart: N/A spot probed Radiographic studies show mild localized horizontal bone loss  recession present Periodontal findings:  Tissue color: Pink, consistency: Stippled CAL: 2-4 mm Bleeding: Less than 30% of sites, localized Furcation involvements: none Mobility: None Calculus: Light supragingival plaque and calculus Patient oral hygiene: Good Patient's oral hygiene is better since last visit  Assessment: Periodontal Diagnosis: Healthy, within normal limits. Dental Diagnosis: Healthy hard/soft tissues. Risks and potential complications: wnl Informed patient of all periodontal findings. Informed patient of recommended treatment procedure(s) based on findings. Patient confirmed they understood and has no additional questions.  Plan: handscale, floss, polish with Enamelpro Fine paste,   OHI: Reviewed brushing and flossing technique.  Additional Notes: stressed flossing under gumline, sweeping away from gums with brush to remove gumline plaque.   Recommended recall interval: 6 months.  Next Visit: Recall/maintenance  Treatment Providers Shawnee Herald, RDH Rico Endow, DMD Student year 1 [facility], DDS Bernice Merck, [facility] - [facility] - [facility] [address] [place] [address] [phone]       [1] Past Medical History: Diagnosis Date  . Asthma   . Breast cancer 2021  . Depression   . History of gestational diabetes   . Oral bisphosphonates causing adverse effect in therapeutic use   . Rheumatic fever   [2] History reviewed. No pertinent surgical history. [3] Social History Tobacco Use  . Smoking status: Never    Passive exposure: Never  . Smokeless tobacco: Never  Substance Use Topics  . Alcohol use: Never  . Drug use: Never  [4] Family History Problem Relation Name Age of Onset  . Lung cancer Mother    . Leukemia Father    [5] Current Outpatient Medications on File Prior to Visit  Medication Sig Dispense Refill  . promethazine -DM (PROMETHAZINE -DM) 6.25-15 mg/5 mL syrup Take 5 mL by mouth.    . sertraline  (ZOLOFT ) 25 mg tablet Take 25 mg by mouth.    . anastrozole  (ARIMIDEX ) 1 mg chemo tablet Take 1 tablet by mouth 1 (one) time each day    . calcium carbonate-vitamin D3 500 mg-10 mcg (400 unit) chewable tablet Chew 1 tablet.    SABRA co-enzyme Q-10 30 mg capsule Take 30 mg by mouth in the morning.    . doxycycline  (VIBRA -TABS) 100 mg tablet Take 100 mg by mouth.    . fluticasone  propionate (FLONASE ) 50 mcg/actuation nasal spray Administer 2 sprays into affected nostril(s).    . hydrocodone -homatropine (HYCODAN) 5-1.5 mg/5  mL syrup Take 5 mL by mouth.    . montelukast  (SINGULAIR ) 10 mg tablet Take 10 mg by mouth.    . multivitamin-Ca-iron-minerals tablet Take 1 tablet by mouth 1 (one) time each day.     . turmeric, bulk, 95 % powder Take by mouth.    . Wixela Inhub 500-50 mcg/dose diskus inhaler INHALE 1 PUFF INTO THE LUNGS IN THE MORNING AND AT BEDTIME.     No current facility-administered medications on file prior to visit.

## 2023-11-08 ENCOUNTER — Inpatient Hospital Stay: Payer: Medicare HMO

## 2023-11-08 ENCOUNTER — Inpatient Hospital Stay: Payer: Medicare HMO | Attending: Nurse Practitioner | Admitting: Nurse Practitioner

## 2023-11-08 ENCOUNTER — Other Ambulatory Visit: Payer: Self-pay

## 2023-11-08 ENCOUNTER — Encounter: Payer: Self-pay | Admitting: Nurse Practitioner

## 2023-11-08 VITALS — BP 110/68 | HR 86 | Temp 98.7°F | Resp 15 | Ht 65.0 in | Wt 176.2 lb

## 2023-11-08 DIAGNOSIS — Z1721 Progesterone receptor positive status: Secondary | ICD-10-CM | POA: Diagnosis not present

## 2023-11-08 DIAGNOSIS — Z923 Personal history of irradiation: Secondary | ICD-10-CM | POA: Diagnosis not present

## 2023-11-08 DIAGNOSIS — Z170 Estrogen receptor positive status [ER+]: Secondary | ICD-10-CM | POA: Insufficient documentation

## 2023-11-08 DIAGNOSIS — Z79811 Long term (current) use of aromatase inhibitors: Secondary | ICD-10-CM | POA: Diagnosis not present

## 2023-11-08 DIAGNOSIS — C50211 Malignant neoplasm of upper-inner quadrant of right female breast: Secondary | ICD-10-CM | POA: Diagnosis not present

## 2023-11-08 DIAGNOSIS — Z1732 Human epidermal growth factor receptor 2 negative status: Secondary | ICD-10-CM | POA: Diagnosis not present

## 2023-11-08 DIAGNOSIS — M8580 Other specified disorders of bone density and structure, unspecified site: Secondary | ICD-10-CM | POA: Diagnosis not present

## 2023-11-08 LAB — CBC WITH DIFFERENTIAL (CANCER CENTER ONLY)
Abs Immature Granulocytes: 0.01 10*3/uL (ref 0.00–0.07)
Basophils Absolute: 0 10*3/uL (ref 0.0–0.1)
Basophils Relative: 1 %
Eosinophils Absolute: 0.1 10*3/uL (ref 0.0–0.5)
Eosinophils Relative: 2 %
HCT: 40.6 % (ref 36.0–46.0)
Hemoglobin: 13.5 g/dL (ref 12.0–15.0)
Immature Granulocytes: 0 %
Lymphocytes Relative: 26 %
Lymphs Abs: 1.6 10*3/uL (ref 0.7–4.0)
MCH: 30.6 pg (ref 26.0–34.0)
MCHC: 33.3 g/dL (ref 30.0–36.0)
MCV: 92.1 fL (ref 80.0–100.0)
Monocytes Absolute: 0.4 10*3/uL (ref 0.1–1.0)
Monocytes Relative: 6 %
Neutro Abs: 3.9 10*3/uL (ref 1.7–7.7)
Neutrophils Relative %: 65 %
Platelet Count: 290 10*3/uL (ref 150–400)
RBC: 4.41 MIL/uL (ref 3.87–5.11)
RDW: 12.2 % (ref 11.5–15.5)
WBC Count: 5.9 10*3/uL (ref 4.0–10.5)
nRBC: 0 % (ref 0.0–0.2)

## 2023-11-08 LAB — CMP (CANCER CENTER ONLY)
ALT: 15 U/L (ref 0–44)
AST: 19 U/L (ref 15–41)
Albumin: 4.4 g/dL (ref 3.5–5.0)
Alkaline Phosphatase: 99 U/L (ref 38–126)
Anion gap: 8 (ref 5–15)
BUN: 13 mg/dL (ref 8–23)
CO2: 27 mmol/L (ref 22–32)
Calcium: 9.8 mg/dL (ref 8.9–10.3)
Chloride: 107 mmol/L (ref 98–111)
Creatinine: 0.9 mg/dL (ref 0.44–1.00)
GFR, Estimated: >60 mL/min (ref >60)
Glucose, Bld: 94 mg/dL (ref 70–99)
Potassium: 4.2 mmol/L (ref 3.5–5.1)
Sodium: 142 mmol/L (ref 135–145)
Total Bilirubin: 0.5 mg/dL (ref 0.0–1.2)
Total Protein: 7.3 g/dL (ref 6.5–8.1)

## 2023-11-08 MED ORDER — IBANDRONATE SODIUM 150 MG PO TABS: 150.0000 mg | ORAL_TABLET | ORAL | 3 refills | Status: AC

## 2023-11-09 ENCOUNTER — Other Ambulatory Visit: Payer: Self-pay

## 2023-11-09 ENCOUNTER — Other Ambulatory Visit: Payer: Self-pay | Admitting: Nurse Practitioner

## 2023-11-09 DIAGNOSIS — Z170 Estrogen receptor positive status [ER+]: Secondary | ICD-10-CM

## 2023-11-09 DIAGNOSIS — C50211 Malignant neoplasm of upper-inner quadrant of right female breast: Secondary | ICD-10-CM

## 2023-12-20 DIAGNOSIS — Z1231 Encounter for screening mammogram for malignant neoplasm of breast: Secondary | ICD-10-CM | POA: Diagnosis not present

## 2023-12-25 ENCOUNTER — Encounter: Payer: Self-pay | Admitting: Nurse Practitioner

## 2024-01-07 DIAGNOSIS — M722 Plantar fascial fibromatosis: Secondary | ICD-10-CM | POA: Diagnosis not present

## 2024-01-28 ENCOUNTER — Ambulatory Visit: Attending: General Surgery

## 2024-01-28 VITALS — Wt 177.4 lb

## 2024-01-28 DIAGNOSIS — Z483 Aftercare following surgery for neoplasm: Secondary | ICD-10-CM | POA: Insufficient documentation

## 2024-01-28 NOTE — Therapy (Signed)
 OUTPATIENT PHYSICAL THERAPY SOZO SCREENING NOTE   Patient Name: Katherine Hanna MRN: [id number] DOB:Dec 17, 1955, 68 y.o., female Today's Date: 01/28/2024  PCP: Antonio Cyndee Jamee JONELLE, DO REFERRING PROVIDER: Aron Shoulders, MD   PT End of Session - 01/28/24 [phone]     Visit Number 2   # unchanged due to screen only   PT Start Time 0808    PT Stop Time 0812    PT Time Calculation (min) 4 min    Activity Tolerance Patient tolerated treatment well    Behavior During Therapy [facility] for tasks assessed/performed          Past Medical History:  Diagnosis Date   Allergic rhinitis    Allergy 1969   Arthritis    knee   Asthma    Breast cancer (HCC)    Cataract 2021   Depression    GERD (gastroesophageal reflux disease) 2019   History of chicken pox    Migraine    Rheumatic fever    as a child.   Ulcer 2019   Past Surgical History:  Procedure Laterality Date   APPENDECTOMY  11/1971   BREAST LUMPECTOMY WITH RADIOACTIVE SEED AND SENTINEL LYMPH NODE BIOPSY Right 01/20/2020   Procedure: RIGHT BREAST LUMPECTOMY WITH RADIOACTIVE SEED AND SENTINEL LYMPH NODE BIOPSY;  Surgeon: Aron Shoulders, MD;  Location: [facility];  Service: General;  Laterality: Right;   BREAST SURGERY  01/20/2020   COLONOSCOPY     EYE SURGERY  08/21/2023   Torn Retina   RETINAL TEAR REPAIR CRYOTHERAPY Left    TONSILLECTOMY     when she was 2 or 3   Patient Active Problem List   Diagnosis Date Noted   Anxiety 10/09/2023   Moderate persistent asthmatic bronchitis with exacerbation 04/11/2022   URI (upper respiratory infection) 04/04/2022   Bronchitis 04/18/2021   Malignant neoplasm of upper-inner quadrant of right breast in female, estrogen receptor positive (HCC) 12/08/2019   Seasonal allergies 07/30/2017   Need for diphtheria-tetanus-pertussis (Tdap) vaccine 07/30/2017   Vitamin D  deficiency 06/28/2016   Osteoporosis 06/18/2015   Asthma with bronchitis 06/26/2014   Food allergy 02/28/2012    Depression 10/11/2010   Preventative health care 10/05/2010   Screening for malignant neoplasm of cervix 10/05/2010   COMMON MIGRAINE 08/31/2009   Allergic rhinitis 08/31/2009   CERVICAL POLYP 08/31/2009   ARTHRITIS 08/31/2009   POSTMENOPAUSAL STATUS 08/31/2009    REFERRING DIAG: right breast cancer at risk for lymphedema  THERAPY DIAG: Aftercare following surgery for neoplasm  PERTINENT HISTORY: Patient was diagnosed on 11/10/2019 with right grade II invasive ductal carcinoma breast cancer. Patient underwent a right lumpectomy and sentinel node biopsy on 01/20/2020 with 1/4 positive axillary lymph nodes. It is ER/PR positive and HER2 negative with a Ki67 of 10%.   PRECAUTIONS: right UE Lymphedema risk, None  SUBJECTIVE: Pt returns for her last 6 month L-Dex   PAIN:  Are you having pain? No  SOZO SCREENING: Patient was assessed today using the SOZO machine to determine the lymphedema index score. This was compared to her baseline score. It was determined that she is within the recommended range when compared to her baseline and no further action is needed at this time. She will continue SOZO screenings. These are done every 3 months for 2 years post operatively followed by every 6 months for 2 years, and then annually.   L-DEX FLOWSHEETS - 01/28/24 0800       L-DEX LYMPHEDEMA SCREENING   Measurement Type Unilateral  L-DEX MEASUREMENT EXTREMITY Upper Extremity    POSITION  Standing    DOMINANT SIDE Right    At Risk Side Right    BASELINE SCORE (UNILATERAL) 4.3    L-DEX SCORE (UNILATERAL) 6    VALUE CHANGE (UNILAT) 1.7         P: Pt has reached the end of her 4 years of SOZO and can cont annual.   [name], PTA 01/28/2024, 8:11 AM

## 2024-02-20 DIAGNOSIS — M722 Plantar fascial fibromatosis: Secondary | ICD-10-CM | POA: Diagnosis not present

## 2024-03-04 DIAGNOSIS — M79671 Pain in right foot: Secondary | ICD-10-CM | POA: Diagnosis not present

## 2024-03-11 DIAGNOSIS — M79671 Pain in right foot: Secondary | ICD-10-CM | POA: Diagnosis not present

## 2024-03-18 DIAGNOSIS — M79671 Pain in right foot: Secondary | ICD-10-CM | POA: Diagnosis not present

## 2024-03-25 DIAGNOSIS — M79671 Pain in right foot: Secondary | ICD-10-CM | POA: Diagnosis not present

## 2024-04-09 DIAGNOSIS — M79671 Pain in right foot: Secondary | ICD-10-CM | POA: Diagnosis not present

## 2024-04-09 DIAGNOSIS — M722 Plantar fascial fibromatosis: Secondary | ICD-10-CM | POA: Diagnosis not present

## 2024-05-03 ENCOUNTER — Other Ambulatory Visit: Payer: Self-pay | Admitting: Pulmonary Disease

## 2024-05-03 DIAGNOSIS — J310 Chronic rhinitis: Secondary | ICD-10-CM

## 2024-05-05 ENCOUNTER — Other Ambulatory Visit: Payer: Self-pay | Admitting: Nurse Practitioner

## 2024-05-05 DIAGNOSIS — C50211 Malignant neoplasm of upper-inner quadrant of right female breast: Secondary | ICD-10-CM

## 2024-05-05 NOTE — Assessment & Plan Note (Signed)
-  Diagnosed in 11/2019 with Invasive ductal carcinoma with DCIS.  S/p right lumpectomy and SLNB with Dr Aron on 01/20/20. Her Mammaprint was low risk Luminal Type A. Adjuvant chemotherapy was not recommended given her low risk disease. She also completed adjuvant radiation.  -She did not have genetic testing.  -She began adjuvant antiestrogen therapy with Anastrozole  in 04/2020, tolerating well except hot flashes but manageable.  -12/19/2022 - Diagnostic mammogram was done  Ultrasound of right breast was performed due to nodular scar tissue that was seen.  Ultimate results were negative.   - 12/19/2022 -DEXA scan done. Results were consistent with osteopenia/low bone density --11/08/2023 - add boniva  monthly. Expect to repeat DEXA scan in 11/2024.  -new 3D diagnostic mammogram ordered for 11/2023.  - Continue anastrozole  1 mg daily. -Labs and follow-up in 6 months, sooner if needed.

## 2024-05-05 NOTE — Progress Notes (Unsigned)
 Patient Care Team: Antonio Meth, Jamee SAUNDERS, DO as PCP - General (Family Medicine) Hollar, Lahoma Greener, MD as Referring Physician (Dermatology) Erasmo Bernardino BRAVO, OD as Consulting Physician (Optometry) Lanny Callander, MD as Consulting Physician (Hematology) Burton, Lacie K, NP as Nurse Practitioner (Nurse Practitioner) Kara Dorn NOVAK, MD as Consulting Physician (Pulmonary Disease) Charlanne Groom, MD as Consulting Physician (Gastroenterology) Mammography, [facility] as Radiologist (Diagnostic Radiology) Jarold Mayo, MD as Consulting Physician (Ophthalmology)  Clinic Day:  05/08/2024  Referring physician: Antonio Meth, Jamee SAUNDERS, DO  ASSESSMENT & PLAN:   Assessment & Plan: Malignant neoplasm of upper-inner quadrant of right breast in female, estrogen receptor positive (HCC) -Diagnosed in 11/2019 with Invasive ductal carcinoma with DCIS.  S/p right lumpectomy and SLNB with Dr Aron on 01/20/20. Her Mammaprint was low risk Luminal Type A. Adjuvant chemotherapy was not recommended given her low risk disease. She also completed adjuvant radiation.  -She did not have genetic testing.  -She began adjuvant antiestrogen therapy with Anastrozole  in 04/2020, tolerating well except hot flashes but manageable.  -12/19/2022 - Diagnostic mammogram was done  Ultrasound of right breast was performed due to nodular scar tissue that was seen.  Ultimate results were negative.   - 12/19/2022 -DEXA scan done. Results were consistent with osteopenia/low bone density --11/08/2023 - add boniva  monthly. Expect to repeat DEXA scan in 11/2024.  -new 3D diagnostic mammogram ordered for 11/2023.  - Continue anastrozole  1 mg daily. -Labs and follow-up in 6 months, sooner if needed.    Bone health DEXA scan done 12/19/2022 showed osteopenia.  FRAX 10-year probability of major osteoporotic fracture is 11% with hip fracture at 1.8%.  She currently takes Boniva  (bisphosphonate) once monthly.  She participates in routine, weightbearing  activities.  Repeat bone density in July 2026.  Breast mass There is new, palpable breast mass in the lower inner quadrant of the right breast.  This is located at the 5 o'clock position, adjacent to the nipple.  It is slightly red and tender to palpate.  It is not fixed.  Contour is smooth.  Will get diagnostic mammogram and ultrasound imaging of the right breast for further evaluation.  Will contact patient with results when they are available.  Right breast cancer, ER + Tolerating anastrozole  well.  She has intermittent hot flashes without other negative side effects.  Recent mammogram done 12/15/2023.  She has category B breast density.  Results were overall benign.  New screening imaging should be done in 11/2024.  Plan Labs reviewed. - CBC and CMP are unremarkable. Will get diagnostic mammogram and ultrasound of new palpable right breast mass.  Will contact patient with results. Patient with osteopenia.  Taking Boniva  monthly.  New DEXA scan due 11/2024. Continue anastrozole  daily. Plan for labs and follow-up in 6 months, sooner if needed.  The patient understands the plans discussed today and is in agreement with them.  She knows to contact our office if she develops concerns prior to her next appointment.  I provided 30 minutes of face-to-face time during this encounter and > 50% was spent counseling as documented under my assessment and plan.    Powell BRAVO Lessen, NP  [facility] - A DEPT OF [facility] [address] [place] [address] Dept: [phone] Dept Fax: [phone]   Orders Placed This Encounter  Procedures   MM DIAG BREAST TOMO UNI RIGHT    Standing Status:   Future    Expected  Date:   05/15/2024    Expiration Date:   05/08/2025    Scheduling Instructions:     Patient goes to [facility].    Reason for Exam (SYMPTOM  OR DIAGNOSIS REQUIRED):   new, palpable mass in lower inner quadrant of  tight breast, adjacent to the nipple. history of breast cancer in right breast.    Preferred imaging location?:   External   US  LIMITED ULTRASOUND INCLUDING AXILLA RIGHT BREAST    Standing Status:   Future    Expected Date:   05/15/2024    Expiration Date:   05/08/2025    Scheduling Instructions:     Patient goes to [facility] mammography.    Reason for Exam (SYMPTOM  OR DIAGNOSIS REQUIRED):   new, palpable mass in lower inner quadrant of tight breast, adjacent to the nipple. history of breast cancer in right breast.    Preferred imaging location?:   External      CHIEF COMPLAINT:  CC: Right breast cancer, ER+  Current Treatment: Anastrozole  1 mg daily (started 04/2020)  INTERVAL HISTORY:  Katherine Hanna is here today for repeat clinical assessment.  She last saw me on 11/08/2023.  She had bilateral 3D screening mammogram on 12/20/2023.  She had breast density category C with overall benign results.  She has noted a new, palpable breast mass in the lower inner quadrant of the right breast.  This is located at the 5 o'clock position, adjacent to the nipple.  It is slightly red and tender to palpate.  States has been present for about a month.  She has noted no other changes.  She continues to take anastrozole  daily without negative side effects.  She denies unusual hot flashes or night sweats.  She denies bone or joint pain.  She denies chest pain, chest pressure, or shortness of breath. She denies headaches or visual disturbances. She denies abdominal pain, nausea, vomiting, or changes in bowel or bladder habits.   denies fevers or chills. She denies pain. Her appetite is good. Her weight has increased 8 pounds over last 6 months.  I have reviewed the past medical history, past surgical history, social history and family history with the patient and they are unchanged from previous note.  ALLERGIES:  is allergic to penicillins.  MEDICATIONS:  Current Outpatient Medications  Medication Sig Dispense Refill    albuterol  (VENTOLIN  HFA) 108 (90 Base) MCG/ACT inhaler Inhale 2 puffs into the lungs every 6 (six) hours as needed for wheezing or shortness of breath. 8 g 0   anastrozole  (ARIMIDEX ) 1 MG tablet TAKE 1 TABLET BY MOUTH EVERY DAY 90 tablet 3   Calcium Carbonate-Vitamin D  (CALCIUM 500 + D PO) Take 1 tablet by mouth 2 (two) times daily.     cetirizine (ZYRTEC) 10 MG tablet Take 10 mg by mouth at bedtime.     co-enzyme Q-10 30 MG capsule Take 30 mg by mouth daily.     fluticasone  (FLONASE ) 50 MCG/ACT nasal spray SPRAY 2 SPRAYS INTO EACH NOSTRIL EVERY DAY 48 mL 1   fluticasone -salmeterol (WIXELA INHUB) 250-50 MCG/ACT AEPB Inhale 1 puff into the lungs in the morning and at bedtime. 60 each 11   ibandronate  (BONIVA ) 150 MG tablet Take 1 tablet (150 mg total) by mouth every 30 (thirty) days. Take in the morning with a full glass of water, on an empty stomach, and do not take anything else by mouth or lie down for the next 30 min. 3 tablet 3   Misc Natural  Products (TART CHERRY ADVANCED PO) Take by mouth.     Multiple Vitamin (MULTIVITAMIN) tablet Take 1 tablet by mouth daily.     promethazine -dextromethorphan (PROMETHAZINE -DM) 6.25-15 MG/5ML syrup Take 5 mLs by mouth 3 (three) times daily as needed for cough. 180 mL 1   sertraline  (ZOLOFT ) 25 MG tablet TAKE 1 TABLET (25 MG TOTAL) BY MOUTH DAILY. 90 tablet 2   TURMERIC PO Take by mouth.     No current facility-administered medications for this visit.    HISTORY OF PRESENT ILLNESS:   Oncology History Overview Note  Cancer Staging Malignant neoplasm of upper-inner quadrant of right breast in female, estrogen receptor positive (HCC) Staging form: Breast, AJCC 8th Edition - Clinical stage from 12/03/2019: Stage IA (cT1c, cN0, cM0, G2, ER+, PR+, HER2-) - Signed by Lanny Callander, MD on 12/09/2019 - Pathologic stage from 01/20/2020: Stage IA (pT1c, pN1a, cM0, G2, ER+, PR+, HER2-) - Signed by Lanny Callander, MD on 04/06/2020    Malignant neoplasm of upper-inner  quadrant of right breast in female, estrogen receptor positive (HCC)  11/25/2019 Mammogram   Mammogram and US  11/25/19  IMPRESSION The 1.1x0.8x0.9cm architectural distortion in the right breast at 3:00 position middle depth 4 cm from nipple is highly suggestive of malignancy. An US  guided biopsy is recommended.     12/03/2019 Initial Biopsy   Diagnosis 12/03/19 Breast, right, needle core biopsy, 3 o'clock, 4 cmfn - INVASIVE DUCTAL CARCINOMA. - DUCTAL CARCINOMA IN SITU. Microscopic Comment The carcinoma appears grade 2. The greatest linear extent of tumor in any one core is 10 mm. Ancillary studies will be reported separately. Results reported to [employer] on 12/04/2019. Intradepartmental consultation (Dr. Towana).   12/03/2019 Receptors her2   PROGNOSTIC INDICATORS Results: IMMUNOHISTOCHEMICAL AND MORPHOMETRIC ANALYSIS PERFORMED MANUALLY The tumor cells are EQUIVOCAL for Her2 (2+). Her2 by FISH will be performed and results reported separately. Estrogen Receptor: 95%, POSITIVE, STRONG STAINING INTENSITY Progesterone Receptor: 80%, POSITIVE, STRONG STAINING INTENSITY Proliferation Marker Ki67: 10%   FLUORESCENCE IN-SITU HYBRIDIZATION Results: GROUP 5: HER2 **NEGATIVE** Equivocal form of amplification of the HER2 gene was detected in the IHC 2+ tissue sample received from this individual. HER2 FISH was performed by a technologist and cell imaging and analysis on the [employer].   12/03/2019 Cancer Staging   Staging form: Breast, AJCC 8th Edition - Clinical stage from 12/03/2019: Stage IA (cT1c, cN0, cM0, G2, ER+, PR+, HER2-) - Signed by Lanny Callander, MD on 12/09/2019   12/08/2019 Initial Diagnosis   Malignant neoplasm of upper-inner quadrant of right breast in female, estrogen receptor positive (HCC)   01/20/2020 Surgery   RIGHT BREAST LUMPECTOMY WITH RADIOACTIVE SEED AND SENTINEL LYMPH NODE BIOPSY by Aron    01/20/2020 Pathology Results   FINAL MICROSCOPIC DIAGNOSIS:   A.  BREAST, RIGHT, LUMPECTOMY:  - Invasive ductal carcinoma, grade 2, spanning 1.3 cm.  - Intermediate grade ductal carcinoma in situ.  - Invasive carcinoma is <1 mm from the lateral margin focally.  - In situ carcinoma is 3 mm from the lateral margin focally.  - Biopsy site.  - Fibrocystic change and usual ductal hyperplasia.  - See oncology table.   B. LYMPH NODE, RIGHT AXILLARY #1, SENTINEL, EXCISION:  - Metastatic carcinoma in one of one lymph nodes (1/1).  - Focal extracapsular extension.   C. LYMPH NODE, RIGHT AXILLARY #2, SENTINEL, EXCISION:  - One of one lymph nodes negative for carcinoma (0/1).   D. LYMPH NODE, RIGHT AXILLARY #3, SENTINEL, EXCISION:  - One of one  lymph nodes negative for carcinoma (0/1).   E. LYMPH NODE, RIGHT AXILLARY #4, SENTINEL, EXCISION:  - One of one lymph nodes negative for carcinoma (0/1).     ADDENDUM:   By immunohistochemistry, HER-2 is EQUIVOCAL (2+).  HER-2 by FISH is  pending and will be reported in an addendum.   ADDENDUM:   FLOURESCENCE IN-SITU HYBRIDIZATION RESULTS:   GROUP 5:   HER2 **NEGATIVE**    01/20/2020 Miscellaneous   Mammaprint Low risk Luminal Type A - MPI at +0.311 She has 97.8% benefit of Horomal Therpay alone    01/20/2020 Cancer Staging   Staging form: Breast, AJCC 8th Edition - Pathologic stage from 01/20/2020: Stage IA (pT1c, pN1a, cM0, G2, ER+, PR+, HER2-) - Signed by Lanny Callander, MD on 04/06/2020   02/26/2020 - 04/13/2020 Radiation Therapy   Adjuvant Radiation with Dr Shannon    04/2020 -  Anti-estrogen oral therapy   Anastrozole  1mg  daily starting in 04/2020    07/13/2020 Survivorship   SCP delivered virtually by Lacie Burton, NP        REVIEW OF SYSTEMS:   Constitutional: Denies fevers, chills or abnormal weight loss Eyes: Denies blurriness of vision Ears, nose, mouth, throat, and face: Denies mucositis or sore throat Respiratory: Denies cough, dyspnea or wheezes Cardiovascular: Denies palpitation, chest  discomfort or lower extremity swelling Gastrointestinal:  Denies nausea, heartburn or change in bowel habits Skin: Denies abnormal skin rashes Lymphatics: Denies new lymphadenopathy or easy bruising Neurological:Denies numbness, tingling or new weaknesses Behavioral/Psych: Mood is stable, no new changes  All other systems were reviewed with the patient and are negative.   VITALS:   Today's Vitals   05/08/24 0829 05/08/24 0833  BP: 138/70   Pulse: 91   Resp: 17   Temp: 98.1 F (36.7 C)   SpO2: 99%   Weight: 184 lb 1.6 oz (83.5 kg)   PainSc:  2    Body mass index is 30.64 kg/m.   Wt Readings from Last 3 Encounters:  05/08/24 184 lb 1.6 oz (83.5 kg)  01/28/24 177 lb 6 oz (80.5 kg)  11/08/23 176 lb 3.2 oz (79.9 kg)    Body mass index is 30.64 kg/m.  Performance status (ECOG): 1 - Symptomatic but completely ambulatory  PHYSICAL EXAM:   GENERAL:alert, no distress and comfortable SKIN: skin color, texture, turgor are normal, no rashes or significant lesions EYES: normal, Conjunctiva are pink and non-injected, sclera clear OROPHARYNX:no exudate, no erythema and lips, buccal mucosa, and tongue normal  NECK: supple, thyroid  normal size, non-tender, without nodularity LYMPH:  no palpable lymphadenopathy in the cervical, axillary or inguinal LUNGS: clear to auscultation and percussion with normal breathing effort HEART: regular rate & rhythm and no murmurs and no lower extremity edema ABDOMEN:abdomen soft, non-tender and normal bowel sounds Musculoskeletal:no cyanosis of digits and no clubbing  NEURO: alert & oriented x 3 with fluent speech, no focal motor/sensory deficits BREAST: The right breast is comprised of mostly fibrous tissue.  There is soft, palpable mass in the lower inner quadrant of the right breast.  This is at 4:00 to 5 o'clock position, and taken to the right nipple.  Contour is smooth.  Mass is not fixed.  It feels fluid-filled.  Slightly tender to palpate.   Slight redness to the mass.  There is no nipple inversion or nipple discharge.  There is well-healed axillary surgical scar without axillary lymphadenopathy on the right.  The left breast is without palpable masses or lumps today.  There is no  nipple inversion or nipple discharge.  There is no axillary lymphadenopathy on the left side.   LABORATORY DATA:  I have reviewed the data as listed    Component Value Date/Time   NA 141 05/08/2024 0754   K 4.2 05/08/2024 0754   CL 105 05/08/2024 0754   CO2 23 05/08/2024 0754   GLUCOSE 101 (H) 05/08/2024 0754   BUN 12 05/08/2024 0754   CREATININE 0.89 05/08/2024 0754   CALCIUM 9.9 05/08/2024 0754   PROT 7.4 05/08/2024 0754   ALBUMIN 4.4 05/08/2024 0754   AST 24 05/08/2024 0754   ALT 18 05/08/2024 0754   ALKPHOS 109 05/08/2024 0754   BILITOT 0.4 05/08/2024 0754   GFRNONAA >60 05/08/2024 0754   GFRAA >60 12/10/2019 1225   Lab Results  Component Value Date   WBC 6.8 05/08/2024   NEUTROABS 4.5 05/08/2024   HGB 13.3 05/08/2024   HCT 39.4 05/08/2024   MCV 92.7 05/08/2024   PLT 280 05/08/2024

## 2024-05-08 ENCOUNTER — Inpatient Hospital Stay: Attending: Nurse Practitioner

## 2024-05-08 ENCOUNTER — Encounter: Payer: Self-pay | Admitting: Nurse Practitioner

## 2024-05-08 ENCOUNTER — Ambulatory Visit: Admitting: Nurse Practitioner

## 2024-05-08 VITALS — BP 138/70 | HR 91 | Temp 98.1°F | Resp 17 | Wt 184.1 lb

## 2024-05-08 DIAGNOSIS — N6314 Unspecified lump in the right breast, lower inner quadrant: Secondary | ICD-10-CM

## 2024-05-08 DIAGNOSIS — Z923 Personal history of irradiation: Secondary | ICD-10-CM | POA: Diagnosis not present

## 2024-05-08 DIAGNOSIS — Z1732 Human epidermal growth factor receptor 2 negative status: Secondary | ICD-10-CM | POA: Insufficient documentation

## 2024-05-08 DIAGNOSIS — Z79811 Long term (current) use of aromatase inhibitors: Secondary | ICD-10-CM | POA: Diagnosis not present

## 2024-05-08 DIAGNOSIS — C50211 Malignant neoplasm of upper-inner quadrant of right female breast: Secondary | ICD-10-CM | POA: Insufficient documentation

## 2024-05-08 DIAGNOSIS — Z1721 Progesterone receptor positive status: Secondary | ICD-10-CM | POA: Insufficient documentation

## 2024-05-08 DIAGNOSIS — M8580 Other specified disorders of bone density and structure, unspecified site: Secondary | ICD-10-CM | POA: Insufficient documentation

## 2024-05-08 DIAGNOSIS — R232 Flushing: Secondary | ICD-10-CM | POA: Diagnosis not present

## 2024-05-08 DIAGNOSIS — Z170 Estrogen receptor positive status [ER+]: Secondary | ICD-10-CM | POA: Insufficient documentation

## 2024-05-08 LAB — CBC WITH DIFFERENTIAL (CANCER CENTER ONLY)
Abs Immature Granulocytes: 0.02 K/uL (ref 0.00–0.07)
Basophils Absolute: 0 K/uL (ref 0.0–0.1)
Basophils Relative: 0 %
Eosinophils Absolute: 0.1 K/uL (ref 0.0–0.5)
Eosinophils Relative: 2 %
HCT: 39.4 % (ref 36.0–46.0)
Hemoglobin: 13.3 g/dL (ref 12.0–15.0)
Immature Granulocytes: 0 %
Lymphocytes Relative: 24 %
Lymphs Abs: 1.7 K/uL (ref 0.7–4.0)
MCH: 31.3 pg (ref 26.0–34.0)
MCHC: 33.8 g/dL (ref 30.0–36.0)
MCV: 92.7 fL (ref 80.0–100.0)
Monocytes Absolute: 0.5 K/uL (ref 0.1–1.0)
Monocytes Relative: 8 %
Neutro Abs: 4.5 K/uL (ref 1.7–7.7)
Neutrophils Relative %: 66 %
Platelet Count: 280 K/uL (ref 150–400)
RBC: 4.25 MIL/uL (ref 3.87–5.11)
RDW: 12.5 % (ref 11.5–15.5)
WBC Count: 6.8 K/uL (ref 4.0–10.5)
nRBC: 0 % (ref 0.0–0.2)

## 2024-05-08 LAB — CMP (CANCER CENTER ONLY)
ALT: 18 U/L (ref 0–44)
AST: 24 U/L (ref 15–41)
Albumin: 4.4 g/dL (ref 3.5–5.0)
Alkaline Phosphatase: 109 U/L (ref 38–126)
Anion gap: 14 (ref 5–15)
BUN: 12 mg/dL (ref 8–23)
CO2: 23 mmol/L (ref 22–32)
Calcium: 9.9 mg/dL (ref 8.9–10.3)
Chloride: 105 mmol/L (ref 98–111)
Creatinine: 0.89 mg/dL (ref 0.44–1.00)
GFR, Estimated: >60 mL/min (ref >60)
Glucose, Bld: 101 mg/dL — ABNORMAL HIGH (ref 70–99)
Potassium: 4.2 mmol/L (ref 3.5–5.1)
Sodium: 141 mmol/L (ref 135–145)
Total Bilirubin: 0.4 mg/dL (ref 0.0–1.2)
Total Protein: 7.4 g/dL (ref 6.5–8.1)

## 2024-05-09 ENCOUNTER — Ambulatory Visit: Payer: Self-pay

## 2024-05-09 ENCOUNTER — Ambulatory Visit (INDEPENDENT_AMBULATORY_CARE_PROVIDER_SITE_OTHER): Admitting: Family

## 2024-05-09 VITALS — BP 131/74 | HR 101 | Temp 100.5°F | Resp 16 | Ht 65.0 in | Wt 182.0 lb

## 2024-05-09 DIAGNOSIS — J019 Acute sinusitis, unspecified: Secondary | ICD-10-CM | POA: Diagnosis not present

## 2024-05-09 MED ORDER — DOXYCYCLINE HYCLATE 100 MG PO TABS: 100.0000 mg | ORAL_TABLET | Freq: Two times a day (BID) | ORAL | 0 refills | Status: DC

## 2024-05-09 MED ORDER — PREDNISONE 10 MG PO TABS: ORAL_TABLET | ORAL | 0 refills | Status: DC

## 2024-05-09 NOTE — Patient Instructions (Signed)
" °  VISIT SUMMARY: Today, you were seen for sinus congestion, ear fullness, and a significant cough that have been ongoing for about a month. You have been using Wixela and promethazine  DM cough syrup without much improvement. You have a known allergy to penicillin.  YOUR PLAN: -ACUTE SINUSITIS: Acute sinusitis is an infection or inflammation of the sinuses, causing symptoms like congestion, ear fullness, and cough. You have been prescribed doxycycline  to help clear the infection. Please call if your symptoms worsen or do not improve after completing the antibiotic.  -ASTHMA WITH ACUTE EXACERBATION: An asthma exacerbation is a worsening of asthma symptoms, such as increased coughing. You have been prescribed a prednisone  taper to reduce inflammation in your airways. Continue using promethazine  DM cough syrup as needed to manage your cough.  INSTRUCTIONS: Please follow up if your symptoms worsen or do not improve after completing the antibiotic. Continue using your prescribed medications as directed.                     "

## 2024-05-09 NOTE — Progress Notes (Signed)
 "  Subjective:     Patient ID: Katherine Hanna, female    DOB: 05/18/1956, 68 y.o.   MRN: [id number]  Chief Complaint  Patient presents with   Cough    Patient complains of cough    Facial Pain    Patient complains of sinus pain and congestion, and  ear fullness bilateral. Started over one week ago    Cough    Discussed the use of AI scribe software for clinical note transcription with the patient, who gave verbal consent to proceed.  History of Present Illness Katherine Hanna is a 68 year old female with asthma who presents with sinus congestion, bilateral ear fullness, and significant cough.  She has been experiencing sinus congestion, bilateral ear fullness, and a significant cough for approximately one month. No fever has been noted during this period.  She has been using Wixela and promethazine  DM cough syrup without much improvement in her symptoms. She has promethazine  DM cough syrup on hand for use as needed.  She has a known allergy to penicillin.      Health Maintenance Due  Topic Date Due   Colonoscopy  10/12/2019    Past Medical History:  Diagnosis Date   Allergic rhinitis    Allergy 1969   Arthritis    knee   Asthma    Breast cancer (HCC)    Cataract 2021   Depression    GERD (gastroesophageal reflux disease) 2019   History of chicken pox    Migraine    Rheumatic fever    as a child.   Ulcer 2019    Past Surgical History:  Procedure Laterality Date   APPENDECTOMY  11/1971   BREAST LUMPECTOMY WITH RADIOACTIVE SEED AND SENTINEL LYMPH NODE BIOPSY Right 01/20/2020   Procedure: RIGHT BREAST LUMPECTOMY WITH RADIOACTIVE SEED AND SENTINEL LYMPH NODE BIOPSY;  Surgeon: Aron Shoulders, MD;  Location: [facility];  Service: General;  Laterality: Right;   BREAST SURGERY  01/20/2020   COLONOSCOPY     EYE SURGERY  08/21/2023   Torn Retina   RETINAL TEAR REPAIR CRYOTHERAPY Left    TONSILLECTOMY     when she was 2 or 3    Family History  Problem  Relation Age of Onset   Lung cancer Mother        smoked   Cancer Mother    COPD Mother    Diabetes Father    Stroke Father    Cancer Father        bladder   Leukemia Father    Stroke Brother    Stroke Maternal Grandmother        40s   Coronary artery disease Maternal Grandfather    Stroke Paternal Grandmother    Colon cancer Maternal Uncle    Breast cancer Paternal Aunt    Stroke Brother    ADD / ADHD Son    Esophageal cancer Neg Hx     Social History   Socioeconomic History   Marital status: Married    Spouse name: Not on file   Number of children: 2   Years of education: Not on file   Highest education level: Bachelor's degree (e.g., BA, AB, BS)  Occupational History   Occupation: IT TRAINER  Tobacco Use   Smoking status: Never   Smokeless tobacco: Never   Tobacco comments:    Both parents smoked  Vaping Use   Vaping status: Never Used  Substance and Sexual Activity   Alcohol use: No  Drug use: Never   Sexual activity: Not Currently    Partners: Male    Birth control/protection: Post-menopausal  Other Topics Concern   Not on file  Social History Narrative   Exercise---   chair exercise, line dancing class    Social Drivers of Health   Tobacco Use: Low Risk (05/08/2024)   Patient History    Smoking Tobacco Use: Never    Smokeless Tobacco Use: Never    Passive Exposure: Not on file  Financial Resource Strain: Low Risk (11/06/2023)   Overall Financial Resource Strain (CARDIA)    Difficulty of Paying Living Expenses: Not hard at all  Food Insecurity: No Food Insecurity (11/06/2023)   Epic    Worried About Programme Researcher, Broadcasting/film/video in the Last Year: Never true    Ran Out of Food in the Last Year: Never true  Transportation Needs: No Transportation Needs (11/06/2023)   Epic    Lack of Transportation (Medical): No    Lack of Transportation (Non-Medical): No  Physical Activity: Sufficiently Active (11/06/2023)   Exercise Vital Sign    Days of Exercise per Week: 2 days     Minutes of Exercise per Session: 90 min  Stress: Stress Concern Present (11/06/2023)   [employer] of Occupational Health - Occupational Stress Questionnaire    Feeling of Stress: To some extent  Social Connections: Socially Integrated (11/06/2023)   Social Connection and Isolation Panel    Frequency of Communication with Friends and Family: Once a week    Frequency of Social Gatherings with Friends and Family: Three times a week    Attends Religious Services: More than 4 times per year    Active Member of Clubs or Organizations: Yes    Attends Banker Meetings: More than 4 times per year    Marital Status: Married  Catering Manager Violence: Not At Risk (11/06/2023)   Epic    Fear of Current or Ex-Partner: No    Emotionally Abused: No    Physically Abused: No    Sexually Abused: No  Depression (PHQ2-9): Low Risk (05/08/2024)   Depression (PHQ2-9)    PHQ-2 Score: 0  Alcohol Screen: Low Risk (11/06/2023)   Alcohol Screen    Last Alcohol Screening Score (AUDIT): 0  Housing: Low Risk (11/06/2023)   Epic    Unable to Pay for Housing in the Last Year: No    Number of Times Moved in the Last Year: 0    Homeless in the Last Year: No  Utilities: Not At Risk (11/06/2023)   Epic    Threatened with loss of utilities: No  Health Literacy: Adequate Health Literacy (11/06/2023)   B1300 Health Literacy    Frequency of need for help with medical instructions: Never    Outpatient Medications Prior to Visit  Medication Sig Dispense Refill   albuterol  (VENTOLIN  HFA) 108 (90 Base) MCG/ACT inhaler Inhale 2 puffs into the lungs every 6 (six) hours as needed for wheezing or shortness of breath. 8 g 0   anastrozole  (ARIMIDEX ) 1 MG tablet TAKE 1 TABLET BY MOUTH EVERY DAY 90 tablet 3   Calcium Carbonate-Vitamin D  (CALCIUM 500 + D PO) Take 1 tablet by mouth 2 (two) times daily.     cetirizine (ZYRTEC) 10 MG tablet Take 10 mg by mouth at bedtime.     co-enzyme Q-10 30 MG capsule Take  30 mg by mouth daily.     fluticasone  (FLONASE ) 50 MCG/ACT nasal spray SPRAY 2 SPRAYS INTO EACH NOSTRIL EVERY  DAY 48 mL 1   fluticasone -salmeterol (WIXELA INHUB) 250-50 MCG/ACT AEPB Inhale 1 puff into the lungs in the morning and at bedtime. 60 each 11   ibandronate  (BONIVA ) 150 MG tablet Take 1 tablet (150 mg total) by mouth every 30 (thirty) days. Take in the morning with a full glass of water, on an empty stomach, and do not take anything else by mouth or lie down for the next 30 min. 3 tablet 3   Misc Natural Products (TART CHERRY ADVANCED PO) Take by mouth.     Multiple Vitamin (MULTIVITAMIN) tablet Take 1 tablet by mouth daily.     promethazine -dextromethorphan (PROMETHAZINE -DM) 6.25-15 MG/5ML syrup Take 5 mLs by mouth 3 (three) times daily as needed for cough. 180 mL 1   sertraline  (ZOLOFT ) 25 MG tablet TAKE 1 TABLET (25 MG TOTAL) BY MOUTH DAILY. 90 tablet 2   TURMERIC PO Take by mouth.     No facility-administered medications prior to visit.    Allergies[1]  Review of Systems  Respiratory:  Positive for cough.        Objective:    Physical Exam Constitutional:      General: She is not in acute distress.    Appearance: Normal appearance. She is well-developed.  HENT:     Head: Normocephalic and atraumatic.     Right Ear: Tympanic membrane, ear canal and external ear normal.     Left Ear: Tympanic membrane, ear canal and external ear normal.     Nose:     Right Sinus: Maxillary sinus tenderness and frontal sinus tenderness present.     Left Sinus: Maxillary sinus tenderness and frontal sinus tenderness present.     Mouth/Throat:     Pharynx: No pharyngeal swelling, oropharyngeal exudate or posterior oropharyngeal erythema.  Eyes:     General: No scleral icterus. Neck:     Thyroid : No thyromegaly.  Cardiovascular:     Rate and Rhythm: Normal rate and regular rhythm.     Heart sounds: Normal heart sounds. No murmur heard. Pulmonary:     Effort: Pulmonary effort is  normal. No respiratory distress.     Breath sounds: Normal breath sounds. No wheezing.  Musculoskeletal:     Cervical back: Neck supple.  Skin:    General: Skin is warm and dry.  Neurological:     Mental Status: She is alert and oriented to person, place, and time.  Psychiatric:        Mood and Affect: Mood normal.        Behavior: Behavior normal.        Thought Content: Thought content normal.        Judgment: Judgment normal.      BP 131/74 (BP Location: Left Arm, Patient Position: Sitting, Cuff Size: Small)   Pulse (!) 101   Temp (!) 100.5 F (38.1 C) (Oral)   Resp 16   Ht 5' 5 (1.651 m)   Wt 182 lb (82.6 kg)   SpO2 96%   BMI 30.29 kg/m  Wt Readings from Last 3 Encounters:  05/09/24 182 lb (82.6 kg)  05/08/24 184 lb 1.6 oz (83.5 kg)  01/28/24 177 lb 6 oz (80.5 kg)       Assessment & Plan:   Problem List Items Addressed This Visit       Unprioritized   Acute sinusitis - Primary    Sinus congestion, bilateral ear fullness, and significant cough for one month without fever. Symptoms unresponsive to current treatment. - Prescribed doxycycline   for sinusitis. - Advised to call if symptoms worsen or persist post-antibiotic.       Relevant Medications   doxycycline  (VIBRA -TABS) 100 MG tablet   predniSONE  (DELTASONE ) 10 MG tablet    Assessment & Plan    I am having Lorelai D. Sedita start on doxycycline  and predniSONE . I am also having her maintain her multivitamin, co-enzyme Q-10, TURMERIC PO, Misc Natural Products (TART CHERRY ADVANCED PO), Calcium Carbonate-Vitamin D  (CALCIUM 500 + D PO), albuterol , cetirizine, fluticasone -salmeterol, promethazine -dextromethorphan, anastrozole , sertraline , ibandronate , and fluticasone .  Meds ordered this encounter  Medications   doxycycline  (VIBRA -TABS) 100 MG tablet    Sig: Take 1 tablet (100 mg total) by mouth 2 (two) times daily.    Dispense:  14 tablet    Refill:  0    Supervising Provider:   DOMENICA BLACKBIRD A [[id number]]    predniSONE  (DELTASONE ) 10 MG tablet    Sig: 4 tabs by mouth once daily for 2 days, then 3 tabs daily x 2 days, then 2 tabs daily x 2 days, then 1 tab daily x 2 days    Dispense:  20 tablet    Refill:  0    Supervising Provider:   DOMENICA BLACKBIRD A [[id number]]      [1]  Allergies Allergen Reactions   Penicillins Rash    Rash head to toe   "

## 2024-05-09 NOTE — Assessment & Plan Note (Signed)
" °  Sinus congestion, bilateral ear fullness, and significant cough for one month without fever. Symptoms unresponsive to current treatment. - Prescribed doxycycline  for sinusitis. - Advised to call if symptoms worsen or persist post-antibiotic.  "

## 2024-05-09 NOTE — Assessment & Plan Note (Signed)
" °  Asthma exacerbation with significant cough. Previous prednisone  taper effective. - Prescribed prednisone  taper. - Continue promethazine  DM cough syrup as needed. - Continue wixela, add albuterol  prn for the next week  "

## 2024-05-09 NOTE — Telephone Encounter (Signed)
 FYI Only or Action Required?: FYI only for provider: appointment scheduled on 05/09/2024.  Patient was last seen in primary care on 10/09/2023 by Antonio Meth, Jamee SAUNDERS, DO.  Called Nurse Triage reporting Cough.  Symptoms began a week ago.  Interventions attempted: OTC medications: saline nasal spray, mucinex DM.  Symptoms are: unchanged.  Triage Disposition: See Physician Within 24 Hours  Patient/caregiver understands and will follow disposition?: Yes  Copied from CRM #[id number]. Topic: Clinical - Red Word Triage >> May 09, 2024  9:29 AM Benton KIDD wrote: [facility] that prompted transfer to Nurse Triage: i have a cold this week coughing things up now and i tend not to get over these things . my sinus is all flared sob   Dr kara [place] Reason for Disposition  Earache  Answer Assessment - Initial Assessment Questions Productive cough, mucous deep yellow Mucinex DM, earache off and on 1. LOCATION: Where does it hurt?      Under eyes, a little on top of head. 2. ONSET: When did the sinus pain start?  (e.g., hours, days)      1 week ago 4. RECURRENT SYMPTOM: Have you ever had sinus problems before? If Yes, ask: When was the last time? and What happened that time?      Yes, went to her chest a few years ago and took 4 months to  get ride of  5. NASAL CONGESTION: Is the nose blocked? If Yes, ask: Can you open it or must you breathe through your mouth?     Yes 6. NASAL DISCHARGE: Do you have discharge from your nose? If so ask, What color?     Denies 7. FEVER: Do you have a fever? If Yes, ask: What is it, how was it measured, and when did it start?      denies 8. OTHER SYMPTOMS: Do you have any other symptoms? (e.g., sore throat, cough, earache, difficulty breathing)  Protocols used: Sinus Pain or Congestion-A-AH

## 2024-05-10 ENCOUNTER — Other Ambulatory Visit: Payer: Self-pay | Admitting: Pulmonary Disease

## 2024-05-10 DIAGNOSIS — J4540 Moderate persistent asthma, uncomplicated: Secondary | ICD-10-CM

## 2024-05-19 ENCOUNTER — Ambulatory Visit: Payer: Self-pay

## 2024-05-19 ENCOUNTER — Encounter: Payer: Self-pay | Admitting: Family

## 2024-05-19 ENCOUNTER — Ambulatory Visit: Payer: Self-pay | Admitting: Family

## 2024-05-19 ENCOUNTER — Ambulatory Visit (HOSPITAL_BASED_OUTPATIENT_CLINIC_OR_DEPARTMENT_OTHER)
Admission: RE | Admit: 2024-05-19 | Discharge: 2024-05-19 | Disposition: A | Source: Ambulatory Visit | Attending: Family | Admitting: Family

## 2024-05-19 ENCOUNTER — Ambulatory Visit: Admitting: Family

## 2024-05-19 VITALS — BP 129/76 | HR 85 | Temp 98.2°F | Resp 16 | Ht 65.0 in | Wt 181.8 lb

## 2024-05-19 DIAGNOSIS — J4541 Moderate persistent asthma with (acute) exacerbation: Secondary | ICD-10-CM | POA: Diagnosis not present

## 2024-05-19 DIAGNOSIS — R051 Acute cough: Secondary | ICD-10-CM | POA: Diagnosis not present

## 2024-05-19 LAB — POCT INFLUENZA A/B
Influenza A, POC: NEGATIVE
Influenza B, POC: NEGATIVE

## 2024-05-19 LAB — POC COVID19 BINAXNOW: SARS Coronavirus 2 Ag: NEGATIVE

## 2024-05-19 MED ORDER — PREDNISONE 20 MG PO TABS: 20.0000 mg | ORAL_TABLET | Freq: Every day | ORAL | 0 refills | Status: DC

## 2024-05-19 NOTE — Addendum Note (Signed)
 Addended by: VALLI TILLMAN BROCKS on: 05/19/2024 02:53 PM   Modules accepted: Orders

## 2024-05-19 NOTE — Telephone Encounter (Signed)
"  Appt scheduled  "

## 2024-05-19 NOTE — Telephone Encounter (Signed)
" °  FYI Only or Action Required?: FYI only for provider: appointment scheduled on 12/29.  Patient was last seen in primary care on 05/09/2024 by Daryl Setter, NP.  Called Nurse Triage reporting No chief complaint on file..  Symptoms began a week ago.  Interventions attempted: Prescription medications: Finished antibiotics/ steroids.  Symptoms are: stable.  Triage Disposition: See Within 3 Days in Office (overriding Home Care)  Patient/caregiver understands and will follow disposition?: Yes    Copied from CRM [id number]. Topic: Clinical - Red Word Triage >> May 19, 2024  8:36 AM Tiffini S wrote: Red Word that prompted transfer to Nurse Triage: Patient was seen on 05/09/24 for URI with hard cough, asthma, sinus with drainage with some SOB Reason for Disposition  MILD asthma attack (e.g., no SOB at rest, mild SOB with walking, speaks normally in sentences, mild wheezing)  Answer Assessment - Initial Assessment Questions 1. RESPIRATORY STATUS: Describe your breathing? (e.g., wheezing, shortness of breath, unable to speak, severe coughing)      Sob with coughing episode    6. ASTHMA MEDICINES:  What treatments have you tried?      *No Answer* 7. INHALED QUICK-RELIEF TREATMENTS FOR THIS ATTACK: What treatments have you given yourself so far? and How many and how often? If using an inhaler, ask, How many puffs? Note: Routine treatments are 2 puffs every 4 hours as needed. Rescue treatments are 4 puffs repeated every 20 minutes, up to three times as needed.      *No Answer* 8. OTHER SYMPTOMS: Do you have any other symptoms? (e.g., chest pain, coughing up yellow sputum, fever, runny nose)     Runny nose 9. O2 SATURATION MONITOR:  Do you use an oxygen saturation monitor (pulse oximeter) at home? If Yes, What is your reading (oxygen level) today? What is your usual oxygen saturation reading? (e.g., 95%)     97%  Protocols used: Asthma Attack-A-AH  "

## 2024-05-19 NOTE — Progress Notes (Signed)
 "  Acute Office Visit  Subjective:     Patient ID: Katherine Hanna, female    DOB: 05/12/1956, 68 y.o.   MRN: [id number]  Chief Complaint  Patient presents with   URI    Patient has URI and still has a lingering cough and asthma which makes it worse. Here in the office to get assessed.     HPI Patient is in today with complaints of persistent lingering cough despite taking doxycycline  and prednisone  10 days ago.  She has a history of asthma and reports the cough continues to linger and she has wheezing.  She feels like something is caught in her chest and her upper lobes.  Cough is nonproductive.  Cough medication has not been effective at helping.  Denies any fever or chills.  However she does feel fatigued.  No known sick contacts.  Review of Systems  Constitutional:  Negative for chills and fever.  HENT:  Positive for congestion.   Respiratory:  Positive for cough and wheezing. Negative for sputum production.   Cardiovascular: Negative.   Gastrointestinal: Negative.   Musculoskeletal: Negative.   Neurological: Negative.   Psychiatric/Behavioral: Negative.    All other systems reviewed and are negative.  Past Medical History:  Diagnosis Date   Allergic rhinitis    Allergy 1969   Arthritis    knee   Asthma    Breast cancer (HCC)    Cataract 2021   Depression    GERD (gastroesophageal reflux disease) 2019   History of chicken pox    Migraine    Rheumatic fever    as a child.   Ulcer 2019    Social History   Socioeconomic History   Marital status: Married    Spouse name: Not on file   Number of children: 2   Years of education: Not on file   Highest education level: Bachelor's degree (e.g., BA, AB, BS)  Occupational History   Occupation: IT TRAINER  Tobacco Use   Smoking status: Never   Smokeless tobacco: Never   Tobacco comments:    Both parents smoked  Vaping Use   Vaping status: Never Used  Substance and Sexual Activity   Alcohol use: No    Drug use: Never   Sexual activity: Not Currently    Partners: Male    Birth control/protection: Post-menopausal  Other Topics Concern   Not on file  Social History Narrative   Exercise---   chair exercise, line dancing class    Social Drivers of Health   Tobacco Use: Low Risk (05/19/2024)   Patient History    Smoking Tobacco Use: Never    Smokeless Tobacco Use: Never    Passive Exposure: Not on file  Financial Resource Strain: Low Risk (11/06/2023)   Overall Financial Resource Strain (CARDIA)    Difficulty of Paying Living Expenses: Not hard at all  Food Insecurity: No Food Insecurity (11/06/2023)   Epic    Worried About Programme Researcher, Broadcasting/film/video in the Last Year: Never true    Ran Out of Food in the Last Year: Never true  Transportation Needs: No Transportation Needs (11/06/2023)   Epic    Lack of Transportation (Medical): No    Lack of Transportation (Non-Medical): No  Physical Activity: Sufficiently Active (11/06/2023)   Exercise Vital Sign    Days of Exercise per Week: 2 days    Minutes of Exercise per Session: 90 min  Stress: Stress Concern Present (11/06/2023)   [employer] of Occupational Health - Occupational  Stress Questionnaire    Feeling of Stress: To some extent  Social Connections: Socially Integrated (11/06/2023)   Social Connection and Isolation Panel    Frequency of Communication with Friends and Family: Once a week    Frequency of Social Gatherings with Friends and Family: Three times a week    Attends Religious Services: More than 4 times per year    Active Member of Clubs or Organizations: Yes    Attends Banker Meetings: More than 4 times per year    Marital Status: Married  Catering Manager Violence: Not At Risk (11/06/2023)   Epic    Fear of Current or Ex-Partner: No    Emotionally Abused: No    Physically Abused: No    Sexually Abused: No  Depression (PHQ2-9): Low Risk (05/08/2024)   Depression (PHQ2-9)     PHQ-2 Score: 0  Alcohol Screen: Low Risk (11/06/2023)   Alcohol Screen    Last Alcohol Screening Score (AUDIT): 0  Housing: Low Risk (11/06/2023)   Epic    Unable to Pay for Housing in the Last Year: No    Number of Times Moved in the Last Year: 0    Homeless in the Last Year: No  Utilities: Not At Risk (11/06/2023)   Epic    Threatened with loss of utilities: No  Health Literacy: Adequate Health Literacy (11/06/2023)   B1300 Health Literacy    Frequency of need for help with medical instructions: Never    Past Surgical History:  Procedure Laterality Date   APPENDECTOMY  11/1971   BREAST LUMPECTOMY WITH RADIOACTIVE SEED AND SENTINEL LYMPH NODE BIOPSY Right 01/20/2020   Procedure: RIGHT BREAST LUMPECTOMY WITH RADIOACTIVE SEED AND SENTINEL LYMPH NODE BIOPSY;  Surgeon: Aron Shoulders, MD;  Location: [facility];  Service: General;  Laterality: Right;   BREAST SURGERY  01/20/2020   COLONOSCOPY     EYE SURGERY  08/21/2023   Torn Retina   RETINAL TEAR REPAIR CRYOTHERAPY Left    TONSILLECTOMY     when she was 2 or 3    Family History  Problem Relation Age of Onset   Lung cancer Mother        smoked   Cancer Mother    COPD Mother    Diabetes Father    Stroke Father    Cancer Father        bladder   Leukemia Father    Stroke Brother    Stroke Maternal Grandmother        40s   Coronary artery disease Maternal Grandfather    Stroke Paternal Grandmother    Colon cancer Maternal Uncle    Breast cancer Paternal Aunt    Stroke Brother    ADD / ADHD Son    Esophageal cancer Neg Hx     Allergies[1]  Medications Ordered Prior to Encounter[2]  BP 129/76 (BP Location: Left Arm, Patient Position: Sitting, Cuff Size: Normal)   Pulse 85   Temp 98.2 F (36.8 C) (Oral)   Resp 16   Ht 5' 5 (1.651 m)   Wt 181 lb 12.8 oz (82.5 kg)   SpO2 100%   BMI 30.25 kg/m chart      Objective:    BP 129/76 (BP Location: Left Arm, Patient Position:  Sitting, Cuff Size: Normal)   Pulse 85   Temp 98.2 F (36.8 C) (Oral)   Resp 16   Ht 5' 5 (1.651 m)   Wt 181 lb 12.8 oz (82.5 kg)  SpO2 100%   BMI 30.25 kg/m    Physical Exam Vitals and nursing note reviewed.  Constitutional:      Appearance: Normal appearance. She is normal weight.  HENT:     Right Ear: Tympanic membrane, ear canal and external ear normal.     Left Ear: Tympanic membrane, ear canal and external ear normal.     Mouth/Throat:     Mouth: Mucous membranes are moist.     Pharynx: Oropharynx is clear.  Cardiovascular:     Rate and Rhythm: Normal rate and regular rhythm.     Pulses: Normal pulses.     Heart sounds: Normal heart sounds.  Pulmonary:     Effort: Pulmonary effort is normal.     Breath sounds: Normal breath sounds.     Comments: Decreased breath sounds bilaterally. Musculoskeletal:        General: Normal range of motion.     Cervical back: Normal range of motion and neck supple.  Skin:    General: Skin is warm and dry.  Neurological:     General: No focal deficit present.     Mental Status: She is alert and oriented to person, place, and time. Mental status is at baseline.  Psychiatric:        Mood and Affect: Mood normal.        Behavior: Behavior normal.        Thought Content: Thought content normal.        Judgment: Judgment normal.    No results found for any visits on 05/19/24.      Assessment & Plan:   Problem List Items Addressed This Visit     Moderate persistent asthmatic bronchitis with exacerbation - Primary   Relevant Medications   predniSONE  (DELTASONE ) 20 MG tablet   Other Relevant Orders   DG Chest 2 View   Other Visit Diagnoses       Acute cough       Relevant Orders   DG Chest 2 View       Meds ordered this encounter  Medications   predniSONE  (DELTASONE ) 20 MG tablet    Sig: Take 1 tablet (20 mg total) by mouth daily with breakfast.    Dispense:  18 tablet    Refill:  0    3 tabs po qam x 3 days, 2  tabs po qam x 3 days, 1 tab po qam x 3 days   Call the office if symptoms worsen or persist.  Recheck pending the results of her  chest x-ray and sooner as needed. No follow-ups on file.  Kelvin Burpee B Alila Sotero, FNP       [1] Allergies Allergen Reactions   Penicillins Rash    Rash head to toe  [2] Current Outpatient Medications on File Prior to Visit  Medication Sig Dispense Refill   albuterol  (VENTOLIN  HFA) 108 (90 Base) MCG/ACT inhaler Inhale 2 puffs into the lungs every 6 (six) hours as needed for wheezing or shortness of breath. 8 g 0   anastrozole  (ARIMIDEX ) 1 MG tablet TAKE 1 TABLET BY MOUTH EVERY DAY 90 tablet 3   Calcium Carbonate-Vitamin D  (CALCIUM 500 + D PO) Take 1 tablet by mouth 2 (two) times daily.     cetirizine (ZYRTEC) 10 MG tablet Take 10 mg by mouth at bedtime.     co-enzyme Q-10 30 MG capsule Take 30 mg by mouth daily.     doxycycline  (VIBRA -TABS) 100 MG tablet Take 1 tablet (100 mg total) by  mouth 2 (two) times daily. 14 tablet 0   fluticasone  (FLONASE ) 50 MCG/ACT nasal spray SPRAY 2 SPRAYS INTO EACH NOSTRIL EVERY DAY 48 mL 1   fluticasone -salmeterol (WIXELA INHUB) 250-50 MCG/ACT AEPB INHALE 1 PUFF INTO THE LUNGS IN THE MORNING AND AT BEDTIME. 60 each 1   ibandronate  (BONIVA ) 150 MG tablet Take 1 tablet (150 mg total) by mouth every 30 (thirty) days. Take in the morning with a full glass of water, on an empty stomach, and do not take anything else by mouth or lie down for the next 30 min. 3 tablet 3   Misc Natural Products (TART CHERRY ADVANCED PO) Take by mouth.     Multiple Vitamin (MULTIVITAMIN) tablet Take 1 tablet by mouth daily.     promethazine -dextromethorphan (PROMETHAZINE -DM) 6.25-15 MG/5ML syrup Take 5 mLs by mouth 3 (three) times daily as needed for cough. 180 mL 1   sertraline  (ZOLOFT ) 25 MG tablet TAKE 1 TABLET (25 MG TOTAL) BY MOUTH DAILY. 90 tablet 2   TURMERIC PO Take by mouth.     No current facility-administered medications on file  prior to visit.  "

## 2024-05-30 ENCOUNTER — Encounter: Payer: Self-pay | Admitting: Hematology

## 2024-06-12 ENCOUNTER — Ambulatory Visit: Payer: Medicare (Managed Care) | Admitting: Pulmonary Disease

## 2024-06-12 ENCOUNTER — Encounter: Payer: Self-pay | Admitting: Nurse Practitioner

## 2024-06-12 ENCOUNTER — Encounter: Payer: Self-pay | Admitting: Pulmonary Disease

## 2024-06-12 VITALS — BP 122/78 | HR 83 | Ht 65.0 in | Wt 184.0 lb

## 2024-06-12 DIAGNOSIS — J40 Bronchitis, not specified as acute or chronic: Secondary | ICD-10-CM | POA: Diagnosis not present

## 2024-06-12 DIAGNOSIS — K219 Gastro-esophageal reflux disease without esophagitis: Secondary | ICD-10-CM

## 2024-06-12 DIAGNOSIS — J309 Allergic rhinitis, unspecified: Secondary | ICD-10-CM

## 2024-06-12 DIAGNOSIS — J4540 Moderate persistent asthma, uncomplicated: Secondary | ICD-10-CM | POA: Diagnosis not present

## 2024-06-12 MED ORDER — PREDNISONE 10 MG PO TABS: 20.0000 mg | ORAL_TABLET | Freq: Every day | ORAL | 0 refills | Status: AC

## 2024-06-12 MED ORDER — ALBUTEROL SULFATE HFA 108 (90 BASE) MCG/ACT IN AERS: 2.0000 | INHALATION_SPRAY | Freq: Four times a day (QID) | RESPIRATORY_TRACT | 11 refills | Status: AC | PRN

## 2024-06-12 MED ORDER — FLUTICASONE-SALMETEROL 250-50 MCG/ACT IN AEPB: 1.0000 | INHALATION_SPRAY | Freq: Two times a day (BID) | RESPIRATORY_TRACT | 11 refills | Status: AC

## 2024-06-12 NOTE — Progress Notes (Signed)
 "  Established Patient Pulmonology Office Visit   Subjective:  Patient ID: Katherine Hanna, female    DOB: [birth year]  MRN: [id number]  CC:  Chief Complaint  Patient presents with   Medical Management of Chronic Issues    Pt states pt states had flare up last month still having cough due to it, feels as if something is stuck     Discussed the use of AI scribe software for clinical note transcription with the patient, who gave verbal consent to proceed.  History of Present Illness Katherine Hanna is a 69 year old female with asthma who presents with a lingering cough.  She reports an asthma flare beginning a couple of weeks before Christmas with persistent cough since then. She uses Wixela 250/50 mcg one puff twice daily but the cough persists, worse in the evenings and at night with occasional morning spells.  She completed two prednisone  courses last month with partial improvement and a course of doxycycline , but the cough has not fully resolved. Albuterol  as needed, including a couple of puffs before bed, significantly reduces nighttime symptoms.  She denies wheezing or significant shortness of breath. She has nasal drainage without congestion and describes the cough as coming from both the chest and throat. She has intermittent heartburn treated with Tums and does not notice a clear relationship between heartburn and cough severity.        ROS   Current Medications[1]      Objective:  BP 122/78   Pulse 83   Ht 5' 5 (1.651 m) Comment: per pt  Wt 184 lb (83.5 kg)   SpO2 96%   BMI 30.62 kg/m     Physical Exam Constitutional:      General: She is not in acute distress.    Appearance: Normal appearance.  Eyes:     General: No scleral icterus.    Conjunctiva/sclera: Conjunctivae normal.  Cardiovascular:     Rate and Rhythm: Normal rate and regular rhythm.  Pulmonary:     Breath sounds: No wheezing, rhonchi or rales.  Musculoskeletal:     Right lower leg: No edema.      Left lower leg: No edema.  Skin:    General: Skin is warm and dry.  Neurological:     General: No focal deficit present.      Diagnostic Review:  Last CBC Lab Results  Component Value Date   WBC 6.8 05/08/2024   HGB 13.3 05/08/2024   HCT 39.4 05/08/2024   MCV 92.7 05/08/2024   MCH 31.3 05/08/2024   RDW 12.5 05/08/2024   PLT 280 05/08/2024   Last metabolic panel Lab Results  Component Value Date   GLUCOSE 101 (H) 05/08/2024   NA 141 05/08/2024   K 4.2 05/08/2024   CL 105 05/08/2024   CO2 23 05/08/2024   BUN 12 05/08/2024   CREATININE 0.89 05/08/2024   GFRNONAA >60 05/08/2024   CALCIUM 9.9 05/08/2024   PROT 7.4 05/08/2024   ALBUMIN 4.4 05/08/2024   BILITOT 0.4 05/08/2024   ALKPHOS 109 05/08/2024   AST 24 05/08/2024   ALT 18 05/08/2024   ANIONGAP 14 05/08/2024       Assessment & Plan:   Assessment & Plan Moderate persistent asthma without complication  Orders:   albuterol  (VENTOLIN  HFA) 108 (90 Base) MCG/ACT inhaler; Inhale 2 puffs into the lungs every 6 (six) hours as needed for wheezing or shortness of breath.   fluticasone -salmeterol (WIXELA INHUB) 250-50 MCG/ACT AEPB; Inhale 1 puff  into the lungs 2 (two) times daily. in the morning and at bedtime.   predniSONE  (DELTASONE ) 10 MG tablet; Take 2 tablets (20 mg total) by mouth daily with breakfast.  Bronchitis  Orders:   predniSONE  (DELTASONE ) 10 MG tablet; Take 2 tablets (20 mg total) by mouth daily with breakfast.   Assessment and Plan Assessment & Plan Moderate persistent asthma Recent flare-up with persistent cough despite Wixela and albuterol . Previous prednisone  course provided relief. Differential includes post-nasal drip and GERD. - Prescribed prednisone  20 mg daily for 7 days. - Refilled Wixela for one year. - Refilled albuterol  inhaler.  Allergic rhinitis Mild nasal drainage, potential contributor to cough due to post-nasal drip. - Continue Flonase  nasal spray. - Consider prescription  atrovent nasal spray if symptoms persist.  Gastroesophageal reflux disease Intermittent heartburn, potential contributor to cough due to reflux irritation. - Consider famotidine 20mg  OTC daily for 30 days if cough persists after prednisone  course      Return in about 1 year (around 06/12/2025) for f/u visit Dr. Kara.   Dorn KATHEE Kara, MD     [1]  Current Outpatient Medications:    anastrozole  (ARIMIDEX ) 1 MG tablet, TAKE 1 TABLET BY MOUTH EVERY DAY, Disp: 90 tablet, Rfl: 3   Calcium Carbonate-Vitamin D  (CALCIUM 500 + D PO), Take 1 tablet by mouth 2 (two) times daily., Disp: , Rfl:    cetirizine (ZYRTEC) 10 MG tablet, Take 10 mg by mouth at bedtime., Disp: , Rfl:    co-enzyme Q-10 30 MG capsule, Take 30 mg by mouth daily., Disp: , Rfl:    fluticasone  (FLONASE ) 50 MCG/ACT nasal spray, SPRAY 2 SPRAYS INTO EACH NOSTRIL EVERY DAY, Disp: 48 mL, Rfl: 1   ibandronate  (BONIVA ) 150 MG tablet, Take 1 tablet (150 mg total) by mouth every 30 (thirty) days. Take in the morning with a full glass of water, on an empty stomach, and do not take anything else by mouth or lie down for the next 30 min., Disp: 3 tablet, Rfl: 3   Misc Natural Products (TART CHERRY ADVANCED PO), Take by mouth., Disp: , Rfl:    Multiple Vitamin (MULTIVITAMIN) tablet, Take 1 tablet by mouth daily., Disp: , Rfl:    predniSONE  (DELTASONE ) 10 MG tablet, Take 2 tablets (20 mg total) by mouth daily with breakfast., Disp: 14 tablet, Rfl: 0   promethazine -dextromethorphan (PROMETHAZINE -DM) 6.25-15 MG/5ML syrup, Take 5 mLs by mouth 3 (three) times daily as needed for cough., Disp: 180 mL, Rfl: 1   sertraline  (ZOLOFT ) 25 MG tablet, TAKE 1 TABLET (25 MG TOTAL) BY MOUTH DAILY., Disp: 90 tablet, Rfl: 2   TURMERIC PO, Take by mouth., Disp: , Rfl:    albuterol  (VENTOLIN  HFA) 108 (90 Base) MCG/ACT inhaler, Inhale 2 puffs into the lungs every 6 (six) hours as needed for wheezing or shortness of breath., Disp: 8 g, Rfl: 11    fluticasone -salmeterol (WIXELA INHUB) 250-50 MCG/ACT AEPB, Inhale 1 puff into the lungs 2 (two) times daily. in the morning and at bedtime., Disp: 60 each, Rfl: 11  "

## 2024-06-12 NOTE — Assessment & Plan Note (Addendum)
" °  Orders:   predniSONE  (DELTASONE ) 10 MG tablet; Take 2 tablets (20 mg total) by mouth daily with breakfast.  "

## 2024-06-12 NOTE — Patient Instructions (Signed)
 Start prednisone  20mg  daily for 7 days  Continue wixella 1 puff twice daily -rinse mouth out after each use  Continue albuterol  inhaler 1-2 puffs every 4-6 hours  If cough persists despite the steroids, consider starting famotidine 20mg  daily or at bed time to reduce reflux symptoms for 30 days  Continue flonase  at night, if nasal congestion persists please message us  for prescription of atrovent nasal spray to use along with the flonase .  Follow up in 1 year, call sooner if needed

## 2024-06-13 ENCOUNTER — Other Ambulatory Visit: Payer: Self-pay | Admitting: General Surgery

## 2024-06-17 LAB — SURGICAL PATHOLOGY

## 2024-06-26 ENCOUNTER — Other Ambulatory Visit: Payer: Self-pay | Admitting: Family Medicine

## 2024-06-26 ENCOUNTER — Other Ambulatory Visit: Payer: Self-pay | Admitting: Hematology

## 2024-06-26 DIAGNOSIS — F419 Anxiety disorder, unspecified: Secondary | ICD-10-CM

## 2024-09-11 ENCOUNTER — Encounter: Admitting: Family Medicine

## 2024-11-06 ENCOUNTER — Inpatient Hospital Stay: Admitting: Nurse Practitioner

## 2024-11-06 ENCOUNTER — Inpatient Hospital Stay

## 2024-11-11 ENCOUNTER — Ambulatory Visit
